# Patient Record
Sex: Male | Born: 1940 | Race: White | Hispanic: No | Marital: Married | Smoking: Former smoker
Health system: Southern US, Community
[De-identification: ages and names within clinical notes are randomized; demographics above are authoritative.]

## PROBLEM LIST (undated history)

## (undated) DIAGNOSIS — M1990 Unspecified osteoarthritis, unspecified site: Secondary | ICD-10-CM

## (undated) DIAGNOSIS — K219 Gastro-esophageal reflux disease without esophagitis: Secondary | ICD-10-CM

## (undated) DIAGNOSIS — E119 Type 2 diabetes mellitus without complications: Secondary | ICD-10-CM

## (undated) DIAGNOSIS — F419 Anxiety disorder, unspecified: Secondary | ICD-10-CM

## (undated) DIAGNOSIS — E7800 Pure hypercholesterolemia, unspecified: Secondary | ICD-10-CM

## (undated) DIAGNOSIS — I2699 Other pulmonary embolism without acute cor pulmonale: Secondary | ICD-10-CM

## (undated) DIAGNOSIS — I499 Cardiac arrhythmia, unspecified: Secondary | ICD-10-CM

## (undated) DIAGNOSIS — K7460 Unspecified cirrhosis of liver: Secondary | ICD-10-CM

## (undated) DIAGNOSIS — K449 Diaphragmatic hernia without obstruction or gangrene: Secondary | ICD-10-CM

## (undated) DIAGNOSIS — R55 Syncope and collapse: Secondary | ICD-10-CM

## (undated) DIAGNOSIS — Z87442 Personal history of urinary calculi: Secondary | ICD-10-CM

## (undated) DIAGNOSIS — I714 Abdominal aortic aneurysm, without rupture, unspecified: Secondary | ICD-10-CM

## (undated) DIAGNOSIS — K5790 Diverticulosis of intestine, part unspecified, without perforation or abscess without bleeding: Secondary | ICD-10-CM

## (undated) DIAGNOSIS — R9439 Abnormal result of other cardiovascular function study: Secondary | ICD-10-CM

## (undated) DIAGNOSIS — I4891 Unspecified atrial fibrillation: Secondary | ICD-10-CM

## (undated) DIAGNOSIS — I480 Paroxysmal atrial fibrillation: Secondary | ICD-10-CM

## (undated) DIAGNOSIS — I5189 Other ill-defined heart diseases: Secondary | ICD-10-CM

## (undated) HISTORY — DX: Other pulmonary embolism without acute cor pulmonale: I26.99

## (undated) HISTORY — DX: Abnormal result of other cardiovascular function study: R94.39

## (undated) HISTORY — DX: Diaphragmatic hernia without obstruction or gangrene: K44.9

## (undated) HISTORY — DX: Syncope and collapse: R55

## (undated) HISTORY — DX: Other ill-defined heart diseases: I51.89

## (undated) HISTORY — DX: Unspecified cirrhosis of liver: K74.60

## (undated) HISTORY — DX: Pure hypercholesterolemia, unspecified: E78.00

## (undated) HISTORY — DX: Gastro-esophageal reflux disease without esophagitis: K21.9

## (undated) HISTORY — DX: Type 2 diabetes mellitus without complications: E11.9

## (undated) HISTORY — DX: Diverticulosis of intestine, part unspecified, without perforation or abscess without bleeding: K57.90

## (undated) HISTORY — DX: Abdominal aortic aneurysm, without rupture, unspecified: I71.40

## (undated) HISTORY — DX: Anxiety disorder, unspecified: F41.9

## (undated) HISTORY — DX: Paroxysmal atrial fibrillation: I48.0

## (undated) HISTORY — DX: Unspecified atrial fibrillation: I48.91

---
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
## 2001-11-10 DIAGNOSIS — I2699 Other pulmonary embolism without acute cor pulmonale: Secondary | ICD-10-CM

## 2001-11-10 HISTORY — DX: Other pulmonary embolism without acute cor pulmonale: I26.99

## 2004-08-19 ENCOUNTER — Other Ambulatory Visit: Admission: RE | Admit: 2004-08-19 | Discharge: 2004-08-19 | Payer: Self-pay | Admitting: Dermatology

## 2004-11-16 ENCOUNTER — Emergency Department (HOSPITAL_COMMUNITY): Admission: EM | Admit: 2004-11-16 | Discharge: 2004-11-16 | Payer: Self-pay | Admitting: Emergency Medicine

## 2004-12-09 ENCOUNTER — Ambulatory Visit: Payer: Self-pay | Admitting: Family Medicine

## 2005-01-13 ENCOUNTER — Ambulatory Visit: Payer: Self-pay | Admitting: Internal Medicine

## 2005-01-13 ENCOUNTER — Ambulatory Visit (HOSPITAL_COMMUNITY): Admission: RE | Admit: 2005-01-13 | Discharge: 2005-01-13 | Payer: Self-pay | Admitting: Internal Medicine

## 2005-01-13 HISTORY — PX: COLONOSCOPY: SHX174

## 2005-01-13 HISTORY — PX: ESOPHAGOGASTRODUODENOSCOPY: SHX1529

## 2005-03-11 ENCOUNTER — Ambulatory Visit: Payer: Self-pay | Admitting: Family Medicine

## 2005-06-18 ENCOUNTER — Ambulatory Visit: Payer: Self-pay | Admitting: Family Medicine

## 2005-08-04 ENCOUNTER — Ambulatory Visit: Payer: Self-pay | Admitting: Family Medicine

## 2005-10-06 ENCOUNTER — Ambulatory Visit: Payer: Self-pay | Admitting: Family Medicine

## 2005-12-30 ENCOUNTER — Ambulatory Visit: Payer: Self-pay | Admitting: Family Medicine

## 2006-04-29 ENCOUNTER — Ambulatory Visit: Payer: Self-pay | Admitting: Family Medicine

## 2006-09-07 ENCOUNTER — Ambulatory Visit: Payer: Self-pay | Admitting: Family Medicine

## 2006-11-17 ENCOUNTER — Ambulatory Visit: Payer: Self-pay | Admitting: Family Medicine

## 2010-02-13 DIAGNOSIS — K5900 Constipation, unspecified: Secondary | ICD-10-CM | POA: Insufficient documentation

## 2010-02-13 DIAGNOSIS — Z86010 Personal history of colonic polyps: Secondary | ICD-10-CM

## 2010-02-13 DIAGNOSIS — F17200 Nicotine dependence, unspecified, uncomplicated: Secondary | ICD-10-CM

## 2010-02-13 DIAGNOSIS — K219 Gastro-esophageal reflux disease without esophagitis: Secondary | ICD-10-CM

## 2010-02-14 ENCOUNTER — Ambulatory Visit: Payer: Self-pay | Admitting: Internal Medicine

## 2010-02-15 DIAGNOSIS — R1319 Other dysphagia: Secondary | ICD-10-CM | POA: Insufficient documentation

## 2010-03-13 ENCOUNTER — Ambulatory Visit (HOSPITAL_COMMUNITY): Admission: RE | Admit: 2010-03-13 | Discharge: 2010-03-13 | Payer: Self-pay | Admitting: Internal Medicine

## 2010-03-13 ENCOUNTER — Ambulatory Visit: Payer: Self-pay | Admitting: Internal Medicine

## 2010-03-13 HISTORY — PX: ESOPHAGOGASTRODUODENOSCOPY: SHX1529

## 2010-03-13 HISTORY — PX: COLONOSCOPY: SHX174
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
## 2010-12-10 NOTE — Letter (Signed)
Summary: EGD/ED/TCS ORDER  EGD/ED/TCS ORDER   Imported By: Ave Filter 02/14/2010 14:50:17  _____________________________________________________________________  External Attachment:    Type:   Image     Comment:   External Document

## 2011-03-28 NOTE — Op Note (Signed)
NAME:  David Duarte, David Duarte NO.:  [id number]   MEDICAL RECORD NO.:  [id number]          PATIENT TYPE:  AMB   LOCATION:  DAY                           FACILITY:  [facility]   PHYSICIAN:  David Duarte, M.D. DATE OF BIRTH:  Mar 22, 1941   DATE OF PROCEDURE:  01/13/2005  DATE OF DISCHARGE:                                 OPERATIVE REPORT   PROCEDURE:  Esophagogastroduodenoscopy, diagnostic, followed by colonoscopy  with biopsy.   INDICATION FOR PROCEDURE:  The patient is a 70 year old gentleman with a  history of colonic polyps, and he had resections done in [place].  He  has been referred by Dr. Lysbeth Duarte for surveillance examination.  He is really  not having any lower GI tract symptoms.  Today he has also asked me to  perform an EGD.  He tells me he had a lesion in his throat which has been  followed for years and was told by [place] physicians it would never  cause him any trouble.  He has longstanding gastroesophageal reflux disease  symptoms.  He is a long-time tobacco user as well and wishes to have his  upper GI tract looked at.  His reflux symptoms are well-controlled on  Protonix.  He does not have any alarming symptoms such as bleeding,  odynophagia, dysphagia or early satiety, weight loss.  EGD and colonoscopy  are therefore now being done.  This approach has been discussed with the  patient at length, the potential risks, benefits, and alternatives have been  reviewed, questions answered.  Please see documentation in the medical  record.   PROCEDURE NOTE:  O2 saturation, blood pressure, pulse, and respiration were  monitored throughout the entirety of both procedures.   CONSCIOUS SEDATION:  Versed 4 mg IV, Demerol 100 mg IV in divided doses,  Cetacaine spray for topical oropharyngeal anesthesia.   INSTRUMENT USED:  Olympus video chip system.   FINDINGS:  EGD:  Examination of the tubular esophagus revealed a couple of  tiny distal esophageal  erosions.  I looked at the hypopharynx, and I could  find no abnormality.  There was no evidence of Barrett's esophagus or other  esophageal mucosal abnormality.  The EG junction was easily traversed.   Stomach:  The gastric cavity was empty and insufflated well with air.  A  thorough examination of the gastric mucosa, including a retroflexed view of  the proximal stomach and esophagogastric junction, demonstrated no  abnormalities aside from a small hiatal hernia.  Pylorus patent and easily  traversed.  Examination of the bulb and second portion revealed no  abnormalities.   Therapeutic/diagnostic maneuvers performed:  None.   The patient tolerated the procedure well and was prepared for colonoscopy.  Digital rectal examination revealed no abnormalities.  Endoscopic findings:  Prep was good.   Rectum:  Examination of the rectal mucosa, including retroflexed view of the  anal verge, revealed a 3 mm diminutive polyp 3 cm in from the anal verge.  Otherwise, rectal mucosa appeared normal.   Colon:  The colonic mucosa was surveyed from the rectosigmoid junction  through the left, transverse and  right colon to the area of the appendiceal  orifice, ileocecal valve and cecum. These structures were well-seen and  photographed for the record.  From this level the scope was slowly withdrawn  and all previously-mentioned mucosal surfaces were again seen.  The patient  was noted to have scattered pancolonic diverticula.  The remainder of the  colonic mucosa appeared normal.  The polyp in the rectum was cold  biopsied/removed/ablated with the cold biopsy forceps.  The patient  tolerated the procedure well, was reacted in endoscopy.   IMPRESSION:  1.  Diminutive rectal polyp, biopsied/ablated with the cold biopsy forceps,      otherwise normal rectum.  2.  Pancolonic diverticula.   Esophagogastroduodenoscopy findings:  1.  Normal-appearing hypopharynx.  2.  Tiny distal esophageal erosion  consistent with mild erosive reflux      esophagitis.  Remainder of the esophageal mucosa appeared normal.  3.  Normal stomach aside from a small hiatal hernia, normal D1, D2.   David Duarte gives impressive history of a problem in his throat for which  he has been followed over the years.  I wonder if he has seen an  otolaryngologist in the past.   Today's colonoscopy findings are reassuring.   RECOMMENDATIONS:  1.  Given his long tobacco history and his reported chronic problem in his      throat, he should be seen by an ENT physician.  I have urged him to seek      out consultation.  2.  Will follow up on pathology.  3.  Diverticulosis literature provided to David Duarte.  4.  Further recommendations to follow.      RMR/MEDQ  D:  01/13/2005  T:  01/13/2005  Job:  [id number]   cc:   David Duarte, M.D.  [address]  [place]  [place] [address]  Fax: [phone]

## 2012-10-04 ENCOUNTER — Other Ambulatory Visit (HOSPITAL_COMMUNITY): Payer: Self-pay | Admitting: Internal Medicine

## 2012-10-04 DIAGNOSIS — R55 Syncope and collapse: Secondary | ICD-10-CM

## 2012-10-04 DIAGNOSIS — R079 Chest pain, unspecified: Secondary | ICD-10-CM

## 2012-10-05 ENCOUNTER — Encounter (HOSPITAL_COMMUNITY)
Admission: RE | Admit: 2012-10-05 | Discharge: 2012-10-05 | Disposition: A | Discharge status: Home / Self Care | Payer: Medicare Other | Source: Ambulatory Visit | Attending: Internal Medicine | Admitting: Internal Medicine

## 2012-10-05 ENCOUNTER — Encounter (HOSPITAL_COMMUNITY): Payer: Self-pay

## 2012-10-05 ENCOUNTER — Ambulatory Visit (HOSPITAL_COMMUNITY)
Admission: RE | Admit: 2012-10-05 | Discharge: 2012-10-05 | Disposition: A | Discharge status: Home / Self Care | Payer: Medicare Other | Source: Ambulatory Visit | Attending: Internal Medicine | Admitting: Internal Medicine

## 2012-10-05 ENCOUNTER — Encounter (HOSPITAL_COMMUNITY): Payer: Self-pay | Admitting: Internal Medicine

## 2012-10-05 DIAGNOSIS — R55 Syncope and collapse: Secondary | ICD-10-CM | POA: Insufficient documentation

## 2012-10-05 DIAGNOSIS — R079 Chest pain, unspecified: Secondary | ICD-10-CM | POA: Insufficient documentation

## 2012-10-05 HISTORY — DX: Type 2 diabetes mellitus without complications: E11.9

## 2012-10-05 MED ORDER — TECHNETIUM TC 99M SESTAMIBI - CARDIOLITE
30.0000 | Freq: Once | INTRAVENOUS | Status: AC | PRN
  Administered 2012-10-05: 13:00:00 25 via INTRAVENOUS

## 2012-10-05 MED ORDER — TECHNETIUM TC 99M SESTAMIBI - CARDIOLITE
10.0000 | Freq: Once | INTRAVENOUS | Status: AC | PRN
  Administered 2012-10-05: 9 via INTRAVENOUS

## 2012-10-05 MED ORDER — SODIUM CHLORIDE 0.9 % IJ SOLN
INTRAMUSCULAR | Status: AC
  Administered 2012-10-05: 10 mL via INTRAVENOUS
  Filled 2012-10-05: qty 10

## 2012-10-05 NOTE — Progress Notes (Signed)
Stress Lab Nurses Notes - David Duarte 10/05/2012 Reason for doing test: Syncope Type of test: Stress Cardiolite Nurse performing test: Parke Poisson, RN Nuclear Medicine Tech: Lou Cal Echo Tech: Not Applicable MD performing test: Dr. Rennis Golden Family MD: Dr. Lysbeth Galas Test explained and consent signed: yes IV started: 22g jelco, Saline lock flushed, No redness or edema and Saline lock started in radiology Symptoms: SOB &  Fatigue Treatment/Intervention: None Reason test stopped: fatigue and reached target HR After recovery IV was: Discontinued via X-ray tech and No redness or edema Patient to return to Nuc. Med at : 14:15 Patient discharged: Home Patient's Condition upon discharge was: stable Comments: During test peak BP 220/81 & HR 150.  Recovery BP 160/81 & HR 86.  Symptoms resolved in recovery .  lBillingsley, Jamesetta So T

## 2012-10-05 NOTE — CV Procedure (Signed)
Patient presented for an exercise nuclear stress test.  He exercised for 4 minutes and 30 seconds on the Bruce treadmill protocol into Stage 2. There was a marked hypertensive response to exercise, peak blood pressure is 220 systolic. Baseline EKG demonstrated sinus rhythm without ischemic changes. Exercise EKG shows sinus tachycardia without ST segment changes. Negative for ischemia.  Perfusion images to follow.  Chrystie Nose, MD, [facility] Attending Cardiologist [facility]

## 2012-10-12 ENCOUNTER — Encounter: Payer: Self-pay | Admitting: Internal Medicine

## 2012-10-13 ENCOUNTER — Encounter: Payer: Self-pay | Admitting: Urgent Care

## 2012-10-13 ENCOUNTER — Ambulatory Visit (INDEPENDENT_AMBULATORY_CARE_PROVIDER_SITE_OTHER): Payer: Medicare Other | Admitting: Urgent Care

## 2012-10-13 ENCOUNTER — Ambulatory Visit: Payer: Medicare Other | Admitting: Gastroenterology

## 2012-10-13 VITALS — BP 138/79 | HR 80 | Temp 97.5°F | Ht 72.0 in | Wt 259.6 lb

## 2012-10-13 DIAGNOSIS — R1319 Other dysphagia: Secondary | ICD-10-CM

## 2012-10-13 DIAGNOSIS — K219 Gastro-esophageal reflux disease without esophagitis: Secondary | ICD-10-CM

## 2012-10-13 NOTE — Assessment & Plan Note (Addendum)
David Duarte is a pleasant 71 y.o. male with episodes of dysphagia with both solids & liquids.  He also has discrete episodes of what sounds like possible aspiration +/- vasovagal syncope.  He has cardiologist on board & is undergoing work-up w/ Dr Rennis Golden.  He will need EGD with possible esophageal dilation by Dr Jena Gauss to look for esophageal culprit such as web, ring or stricture.  I have discussed risks & benefits which include, but are not limited to, bleeding, infection, perforation & drug reaction.  The patient agrees with this plan & written consent will be obtained.  Phenergan 25mg  IV 30 min prior to procedure to augment sedation given pt has some recall of last procedure under conscious sedation.  If EGD benign, consider evaluation for oropharyngeal component. Hold diabetes medications day of procedure-GLYBURIDE & GLUCOPHAGE

## 2012-10-13 NOTE — Patient Instructions (Addendum)
Hold diabetes medications day of procedure-GLYBURIDE & GLUCOPHAGE Continue omeprazole 20mg  before breakfast & dinner EGD (upper endoscopy) with Dr Jena Gauss.  He may stretch your esophagus at the same time.
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
## 2012-10-14 ENCOUNTER — Encounter (HOSPITAL_COMMUNITY): Payer: Self-pay | Admitting: Pharmacy Technician

## 2012-10-14 ENCOUNTER — Encounter: Payer: Self-pay | Admitting: Urgent Care

## 2012-10-14 NOTE — Progress Notes (Signed)
Faxed to PCP

## 2012-10-14 NOTE — Assessment & Plan Note (Signed)
Symptomatic control of heartburn & indigestion with omeprazole 20mg  before breakfast & dinner.  See dysphagia.

## 2012-10-25 ENCOUNTER — Encounter (HOSPITAL_COMMUNITY): Payer: Self-pay | Admitting: *Deleted

## 2012-10-25 ENCOUNTER — Encounter (HOSPITAL_COMMUNITY)
Admission: RE | Disposition: A | Discharge status: Home / Self Care | Payer: Self-pay | Source: Ambulatory Visit | Attending: Internal Medicine

## 2012-10-25 ENCOUNTER — Other Ambulatory Visit: Payer: Self-pay | Admitting: Internal Medicine

## 2012-10-25 ENCOUNTER — Ambulatory Visit (HOSPITAL_COMMUNITY)
Admission: RE | Admit: 2012-10-25 | Discharge: 2012-10-25 | Disposition: A | Discharge status: Home / Self Care | Payer: Medicare Other | Source: Ambulatory Visit | Attending: Internal Medicine | Admitting: Internal Medicine

## 2012-10-25 ENCOUNTER — Telehealth: Payer: Self-pay | Admitting: Internal Medicine

## 2012-10-25 DIAGNOSIS — R1310 Dysphagia, unspecified: Secondary | ICD-10-CM

## 2012-10-25 DIAGNOSIS — K449 Diaphragmatic hernia without obstruction or gangrene: Secondary | ICD-10-CM | POA: Insufficient documentation

## 2012-10-25 DIAGNOSIS — E119 Type 2 diabetes mellitus without complications: Secondary | ICD-10-CM | POA: Insufficient documentation

## 2012-10-25 DIAGNOSIS — D131 Benign neoplasm of stomach: Secondary | ICD-10-CM | POA: Insufficient documentation

## 2012-10-25 DIAGNOSIS — K222 Esophageal obstruction: Secondary | ICD-10-CM | POA: Insufficient documentation

## 2012-10-25 DIAGNOSIS — K219 Gastro-esophageal reflux disease without esophagitis: Secondary | ICD-10-CM | POA: Insufficient documentation

## 2012-10-25 HISTORY — PX: ESOPHAGOGASTRODUODENOSCOPY (EGD) WITH ESOPHAGEAL DILATION: SHX5812

## 2012-10-25 LAB — GLUCOSE, CAPILLARY: Glucose-Capillary: 182 mg/dL — ABNORMAL HIGH (ref 70–99)

## 2012-10-25 SURGERY — ESOPHAGOGASTRODUODENOSCOPY (EGD) WITH ESOPHAGEAL DILATION
Anesthesia: Moderate Sedation

## 2012-10-25 MED ORDER — MIDAZOLAM HCL 5 MG/5ML IJ SOLN
INTRAMUSCULAR | Status: AC
  Filled 2012-10-25: qty 10

## 2012-10-25 MED ORDER — STERILE WATER FOR IRRIGATION IR SOLN
Status: DC | PRN
  Administered 2012-10-25: 08:00:00

## 2012-10-25 MED ORDER — PROMETHAZINE HCL 25 MG/ML IJ SOLN
INTRAMUSCULAR | Status: AC
  Filled 2012-10-25: qty 1

## 2012-10-25 MED ORDER — SODIUM CHLORIDE 0.9 % IJ SOLN
INTRAMUSCULAR | Status: AC
  Filled 2012-10-25: qty 10

## 2012-10-25 MED ORDER — MEPERIDINE HCL 100 MG/ML IJ SOLN
INTRAMUSCULAR | Status: DC | PRN
  Administered 2012-10-25 (×2): 50 mg via INTRAVENOUS

## 2012-10-25 MED ORDER — MIDAZOLAM HCL 5 MG/5ML IJ SOLN
INTRAMUSCULAR | Status: DC | PRN
  Administered 2012-10-25 (×2): 2 mg via INTRAVENOUS

## 2012-10-25 MED ORDER — SODIUM CHLORIDE 0.45 % IV SOLN
INTRAVENOUS | Status: DC
  Administered 2012-10-25: 1000 mL via INTRAVENOUS

## 2012-10-25 MED ORDER — MEPERIDINE HCL 100 MG/ML IJ SOLN
INTRAMUSCULAR | Status: AC
  Filled 2012-10-25: qty 2

## 2012-10-25 MED ORDER — PROMETHAZINE HCL 25 MG/ML IJ SOLN
12.5000 mg | Freq: Once | INTRAMUSCULAR | Status: AC
  Administered 2012-10-25: 12.5 mg via INTRAVENOUS

## 2012-10-25 MED ORDER — BUTAMBEN-TETRACAINE-BENZOCAINE 2-2-14 % EX AERO
INHALATION_SPRAY | CUTANEOUS | Status: DC | PRN
  Administered 2012-10-25: 2 via TOPICAL

## 2012-10-25 NOTE — Op Note (Signed)
[facility] [address] [place], [address]   ENDOSCOPY PROCEDURE REPORT  PATIENT: David Duarte, David Duarte  MR#: [id number] BIRTHDATE: Feb 24, 1941 , 71  yrs. old GENDER: Male ENDOSCOPIST: R.  Roetta Sessions, MD [facility] REFERRED BY:  Joette Catching, M.D. , Dr. Rennis Golden PROCEDURE DATE:  10/25/2012 PROCEDURE:     EGD with Elease Hashimoto dilation followed by gastric biopsy  INDICATIONS:    Recurrent esophageal dysphagia. Patient gives me a history of more solid food and pill dysphagia greater than liquid dysphagia.  INFORMED CONSENT:   The risks, benefits, limitations, alternatives and imponderables have been discussed.  The potential for biopsy, esophogeal dilation, etc. have also been reviewed.  Questions have been answered.  All parties agreeable.  Please see the history and physical in the medical record for more information.  MEDICATIONS:    Versed 4 mg IV and Demerol 100 mg IV in divided doses. Cetacaine spray. Phenergan 12.5 mg IV  DESCRIPTION OF PROCEDURE:   The AO-1308M ([id number])  endoscope was introduced through the mouth and advanced to the second portion of the duodenum without difficulty or limitations.  The mucosal surfaces were surveyed very carefully during advancement of the scope and upon withdrawal.  Retroflexion view of the proximal stomach and esophagogastric junction was performed.      FINDINGS: Noncritical. Schatzki's ring; otherwise normal-appearing esophagus. Stomach empty. Moderate-sized final hernia. Multiple tiny 2-3 mm fundal gland-appearing polyps; otherwise, the remainder of the gastric mucosa appeared normal. Pylorus patent. Normal first and second portion of the duodenum  THERAPEUTIC / DIAGNOSTIC MANEUVERS PERFORMED:  A 56 French Maloney dilator is passed to full insertion easily. Subsequently, a 24 French Maloney dilator was passed to full insertion easily. A look back revealed the ring had been ruptured without apparent complication.  Finally, one of the gastric polyps was biopsied for histologic study   COMPLICATIONS:  None  IMPRESSION:  Schatzki's ring-status post dilation as described above. Hiatal hernia. Gastric polyps-status post biopsy  RECOMMENDATIONS:   Continue omeprazole20 mg orally twicedaily. Office visit with [place] in 6 weeks. Followup on pathology.  Please note, loud snoring noted during the procedure. Interviewed wife. Spouse says he "rattles the walls" at night. Tosses and turns all night. She is unable to sleep in the same room because of the noise.   I  suspect the patient likely has undiagnosed sleep apnea. I would recommend further evaluation with a sleep study per doctor's Nyland /  Hilty.    _______________________________ R. Roetta Sessions, MD FACP [facility] eSigned:  R. Roetta Sessions, MD FACP [facility] 10/25/2012 8:22 AM     CC:  PATIENT NAME:  [name], [name] MR#: [id number]
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
## 2012-10-25 NOTE — Interval H&P Note (Signed)
History and Physical Interval Note:  10/25/2012 7:43 AM  David Duarte  has presented today for surgery, with the diagnosis of DYSPHAGIA AND GERD  The various methods of treatment have been discussed with the patient and family. After consideration of risks, benefits and other options for treatment, the patient has consented to  Procedure(s) (LRB) with comments: ESOPHAGOGASTRODUODENOSCOPY (EGD) WITH ESOPHAGEAL DILATION (N/A) - 7:30/GIVE PHENERGAN 25 IV 30 MINS PRIOR TO PROCEDURE as a surgical intervention .  The patient's history has been reviewed, patient examined, no change in status, stable for surgery.  I have reviewed the patient's chart and labs.  Questions were answered to the patient's satisfaction.     Eula Listen  As outlined in consultation note. Says pills and food stick, pointing to his suprasternal notch. He states dilation 2011 helped. EGD with dilation as appropriate per plan.The risks, benefits, limitations, alternatives and imponderables have been reviewed with the patient. Potential for esophageal dilation, biopsy, etc. have also been reviewed.  Questions have been answered. All parties agreeable.

## 2012-10-25 NOTE — Telephone Encounter (Signed)
Phenergan wasn't put in by me because the day he was scheduled I didn't have privileges to put it in but I talked to Shawna Orleans and Selena Batten and I was told that they could do it until I received privileges again

## 2012-10-25 NOTE — Telephone Encounter (Signed)
Message copied by Glendora Score on Mon Oct 25, 2012 12:34 PM ------      Message from: Jennings Books      Created: Mon Oct 25, 2012 12:29 PM       Please investigate and follow-up with RMR.      ----- Message -----         From: Corbin Ade, MD         Sent: 10/25/2012   7:37 AM           To: Osvaldo Angst, NP            Phenergan recommended at the time of consultation but not ordered. Procedure delayed.

## 2012-10-27 ENCOUNTER — Encounter: Payer: Self-pay | Admitting: Internal Medicine

## 2012-10-27 ENCOUNTER — Encounter: Payer: Self-pay | Admitting: *Deleted

## 2012-10-28 ENCOUNTER — Encounter (HOSPITAL_COMMUNITY): Payer: Self-pay | Admitting: Internal Medicine

## 2012-11-14 ENCOUNTER — Encounter: Payer: Self-pay | Admitting: Internal Medicine

## 2012-12-02 ENCOUNTER — Encounter: Payer: Self-pay | Admitting: Internal Medicine

## 2012-12-06 ENCOUNTER — Ambulatory Visit: Payer: Medicare Other | Admitting: Urgent Care

## 2012-12-06 ENCOUNTER — Ambulatory Visit: Payer: Medicare Other | Admitting: Gastroenterology

## 2013-03-16 ENCOUNTER — Encounter: Payer: Self-pay | Admitting: Internal Medicine

## 2014-02-01 ENCOUNTER — Telehealth: Payer: Self-pay | Admitting: *Deleted

## 2014-02-01 NOTE — Telephone Encounter (Signed)
Pt's wife called saying Dr. 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 Per wife Dr. Edrick Oh said that pt needs to have another colonoscopy, pt is not due for a colonoscopy until 03/2015, pt's wife is concerned about insurance not paying for him to go ahead and have one done because it is not due. Please advise [phone]

## 2014-02-07 NOTE — Telephone Encounter (Signed)
Tried to call to get more details. We have not received a referral from Dr. Edrick Oh yet.

## 2014-02-08 NOTE — Telephone Encounter (Signed)
I called PCP office and spoke with Nunzio Cory. I told her that the patient's wife had called last week and we hadn't received a referral and needed more details of what was going on with patient. She is checking with Dr Edrick Oh to see what he advises and she will get back with [place] or either fax referral.

## 2015-02-25 ENCOUNTER — Emergency Department (HOSPITAL_COMMUNITY): Payer: Medicare Other

## 2015-02-25 ENCOUNTER — Emergency Department (HOSPITAL_COMMUNITY)
Admission: EM | Admit: 2015-02-25 | Discharge: 2015-02-25 | Disposition: A | Discharge status: Home / Self Care | Payer: Medicare Other | Attending: Emergency Medicine | Admitting: Emergency Medicine

## 2015-02-25 ENCOUNTER — Encounter (HOSPITAL_COMMUNITY): Payer: Self-pay | Admitting: Emergency Medicine

## 2015-02-25 DIAGNOSIS — S0181XA Laceration without foreign body of other part of head, initial encounter: Secondary | ICD-10-CM

## 2015-02-25 DIAGNOSIS — Z86711 Personal history of pulmonary embolism: Secondary | ICD-10-CM | POA: Diagnosis not present

## 2015-02-25 DIAGNOSIS — Z7982 Long term (current) use of aspirin: Secondary | ICD-10-CM | POA: Insufficient documentation

## 2015-02-25 DIAGNOSIS — S01412A Laceration without foreign body of left cheek and temporomandibular area, initial encounter: Secondary | ICD-10-CM | POA: Diagnosis not present

## 2015-02-25 DIAGNOSIS — W1839XA Other fall on same level, initial encounter: Secondary | ICD-10-CM | POA: Diagnosis not present

## 2015-02-25 DIAGNOSIS — E780 Pure hypercholesterolemia: Secondary | ICD-10-CM | POA: Diagnosis not present

## 2015-02-25 DIAGNOSIS — Y939 Activity, unspecified: Secondary | ICD-10-CM | POA: Diagnosis not present

## 2015-02-25 DIAGNOSIS — K219 Gastro-esophageal reflux disease without esophagitis: Secondary | ICD-10-CM | POA: Insufficient documentation

## 2015-02-25 DIAGNOSIS — Y929 Unspecified place or not applicable: Secondary | ICD-10-CM | POA: Insufficient documentation

## 2015-02-25 DIAGNOSIS — Z87891 Personal history of nicotine dependence: Secondary | ICD-10-CM | POA: Diagnosis not present

## 2015-02-25 DIAGNOSIS — E119 Type 2 diabetes mellitus without complications: Secondary | ICD-10-CM | POA: Diagnosis not present

## 2015-02-25 DIAGNOSIS — F10920 Alcohol use, unspecified with intoxication, uncomplicated: Secondary | ICD-10-CM

## 2015-02-25 DIAGNOSIS — Z79899 Other long term (current) drug therapy: Secondary | ICD-10-CM | POA: Diagnosis not present

## 2015-02-25 DIAGNOSIS — Y999 Unspecified external cause status: Secondary | ICD-10-CM | POA: Diagnosis not present

## 2015-02-25 DIAGNOSIS — R51 Headache: Secondary | ICD-10-CM | POA: Diagnosis not present

## 2015-02-25 DIAGNOSIS — R112 Nausea with vomiting, unspecified: Secondary | ICD-10-CM | POA: Insufficient documentation

## 2015-02-25 DIAGNOSIS — T1490XA Injury, unspecified, initial encounter: Secondary | ICD-10-CM

## 2015-02-25 DIAGNOSIS — Z23 Encounter for immunization: Secondary | ICD-10-CM | POA: Insufficient documentation

## 2015-02-25 DIAGNOSIS — F10120 Alcohol abuse with intoxication, uncomplicated: Secondary | ICD-10-CM | POA: Diagnosis not present

## 2015-02-25 DIAGNOSIS — F419 Anxiety disorder, unspecified: Secondary | ICD-10-CM | POA: Insufficient documentation

## 2015-02-25 LAB — CBC WITH DIFFERENTIAL/PLATELET
BASOS PCT: 0 % (ref 0–1)
Basophils Absolute: 0 10*3/uL (ref 0.0–0.1)
EOS PCT: 2 % (ref 0–5)
Eosinophils Absolute: 0.2 10*3/uL (ref 0.0–0.7)
HEMATOCRIT: 36.2 % — AB (ref 39.0–52.0)
Hemoglobin: 12.4 g/dL — ABNORMAL LOW (ref 13.0–17.0)
LYMPHS ABS: 1.1 10*3/uL (ref 0.7–4.0)
LYMPHS PCT: 13 % (ref 12–46)
MCH: 32.1 pg (ref 26.0–34.0)
MCHC: 34.3 g/dL (ref 30.0–36.0)
MCV: 93.8 fL (ref 78.0–100.0)
MONOS PCT: 10 % (ref 3–12)
Monocytes Absolute: 0.8 10*3/uL (ref 0.1–1.0)
NEUTROS ABS: 6.2 10*3/uL (ref 1.7–7.7)
NEUTROS PCT: 75 % (ref 43–77)
PLATELETS: 149 10*3/uL — AB (ref 150–400)
RBC: 3.86 MIL/uL — AB (ref 4.22–5.81)
RDW: 14.2 % (ref 11.5–15.5)
WBC: 8.2 10*3/uL (ref 4.0–10.5)

## 2015-02-25 LAB — BASIC METABOLIC PANEL
ANION GAP: 13 (ref 5–15)
BUN: 13 mg/dL (ref 6–23)
CHLORIDE: 99 mmol/L (ref 96–112)
CO2: 21 mmol/L (ref 19–32)
CREATININE: 0.78 mg/dL (ref 0.50–1.35)
Calcium: 8.7 mg/dL (ref 8.4–10.5)
GFR calc non Af Amer: 88 mL/min — ABNORMAL LOW (ref >90)
Glucose, Bld: 175 mg/dL — ABNORMAL HIGH (ref 70–99)
Potassium: 3.5 mmol/L (ref 3.5–5.1)
SODIUM: 133 mmol/L — AB (ref 135–145)

## 2015-02-25 LAB — ETHANOL: ALCOHOL ETHYL (B): 155 mg/dL — AB (ref 0–9)

## 2015-02-25 LAB — CBG MONITORING, ED: Glucose-Capillary: 174 mg/dL — ABNORMAL HIGH (ref 70–99)

## 2015-02-25 MED ORDER — ONDANSETRON HCL 4 MG/2ML IJ SOLN
4.0000 mg | Freq: Once | INTRAMUSCULAR | Status: AC
  Administered 2015-02-25: 4 mg via INTRAVENOUS
  Filled 2015-02-25: qty 2

## 2015-02-25 MED ORDER — LIDOCAINE HCL (PF) 1 % IJ SOLN
10.0000 mL | Freq: Once | INTRAMUSCULAR | Status: AC
  Administered 2015-02-25: 1 mL via INTRADERMAL
  Filled 2015-02-25: qty 10

## 2015-02-25 MED ORDER — TETANUS-DIPHTH-ACELL PERTUSSIS 5-2.5-18.5 LF-MCG/0.5 IM SUSP
0.5000 mL | Freq: Once | INTRAMUSCULAR | Status: AC
  Administered 2015-02-25: 0.5 mL via INTRAMUSCULAR
  Filled 2015-02-25: qty 0.5

## 2015-02-25 MED ORDER — FENTANYL CITRATE (PF) 100 MCG/2ML IJ SOLN
50.0000 ug | Freq: Once | INTRAMUSCULAR | Status: AC
  Administered 2015-02-25: 50 ug via INTRAVENOUS
  Filled 2015-02-25: qty 2

## 2015-02-25 MED ORDER — SODIUM CHLORIDE 0.9 % IV BOLUS (SEPSIS)
1000.0000 mL | Freq: Once | INTRAVENOUS | Status: AC
  Administered 2015-02-25: 1000 mL via INTRAVENOUS

## 2015-02-25 NOTE — ED Notes (Signed)
EMS reports head lac above left eye and left eye swelling of unknown origin; + ETOH ( pt reports more than usual); EMS also reports being combative on scene but calm and cooperative at this time; EMS also reports patient is conscious and alert to self, time, and place but unaware of situation leading to ER visit; EMS also reports one episode of projectile vomiting enroute.
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
## 2015-02-25 NOTE — ED Notes (Signed)
Joya Gaskins, MD at bedside performing laceration repair.

## 2015-02-25 NOTE — ED Notes (Signed)
Cash collected from patient, counted at bedside with patient and security, various bills totaling $2,344.00; cash locked in safe and key remains with nurse

## 2015-03-08 ENCOUNTER — Encounter: Payer: Self-pay | Admitting: Internal Medicine

## 2015-05-30 ENCOUNTER — Encounter: Payer: Self-pay | Admitting: *Deleted

## 2016-08-21 ENCOUNTER — Ambulatory Visit (INDEPENDENT_AMBULATORY_CARE_PROVIDER_SITE_OTHER): Payer: PRIVATE HEALTH INSURANCE | Admitting: Orthopaedic Surgery

## 2016-08-21 DIAGNOSIS — M25561 Pain in right knee: Secondary | ICD-10-CM

## 2016-08-21 DIAGNOSIS — M1711 Unilateral primary osteoarthritis, right knee: Secondary | ICD-10-CM

## 2016-09-25 DIAGNOSIS — F3342 Major depressive disorder, recurrent, in full remission: Secondary | ICD-10-CM | POA: Insufficient documentation

## 2017-01-01 ENCOUNTER — Ambulatory Visit (INDEPENDENT_AMBULATORY_CARE_PROVIDER_SITE_OTHER): Payer: PRIVATE HEALTH INSURANCE | Admitting: Orthopaedic Surgery

## 2017-01-01 ENCOUNTER — Encounter (INDEPENDENT_AMBULATORY_CARE_PROVIDER_SITE_OTHER): Payer: Self-pay | Admitting: Orthopaedic Surgery

## 2017-01-01 VITALS — BP 137/74 | HR 94 | Ht 72.0 in | Wt 265.0 lb

## 2017-01-01 DIAGNOSIS — M25571 Pain in right ankle and joints of right foot: Secondary | ICD-10-CM | POA: Diagnosis not present
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
## 2017-01-07 ENCOUNTER — Ambulatory Visit (INDEPENDENT_AMBULATORY_CARE_PROVIDER_SITE_OTHER): Payer: PRIVATE HEALTH INSURANCE | Admitting: Nurse Practitioner

## 2017-01-07 ENCOUNTER — Encounter: Payer: Self-pay | Admitting: Nurse Practitioner

## 2017-01-07 ENCOUNTER — Other Ambulatory Visit: Payer: Self-pay

## 2017-01-07 VITALS — BP 151/83 | HR 75 | Temp 97.7°F | Ht 72.0 in | Wt 268.8 lb

## 2017-01-07 DIAGNOSIS — K5900 Constipation, unspecified: Secondary | ICD-10-CM | POA: Diagnosis not present

## 2017-01-07 DIAGNOSIS — R1319 Other dysphagia: Secondary | ICD-10-CM | POA: Diagnosis not present

## 2017-01-07 DIAGNOSIS — Z86010 Personal history of colonic polyps: Secondary | ICD-10-CM | POA: Diagnosis not present

## 2017-01-07 DIAGNOSIS — R1310 Dysphagia, unspecified: Secondary | ICD-10-CM

## 2017-01-07 DIAGNOSIS — R109 Unspecified abdominal pain: Secondary | ICD-10-CM

## 2017-01-07 MED ORDER — PEG 3350-KCL-NA BICARB-NACL 420 G PO SOLR: 4000.0000 mL | ORAL | 0 refills | Status: DC

## 2017-01-07 NOTE — Assessment & Plan Note (Addendum)
Patient with noted recurrence of dysphagia. Has had multiple dilations in the past. No odynophagia. Typically 2-3 times a week with associated regurgitation. At this point we'll proceed with upper endoscopy with possible dilation due to dysphagia. Return for follow-up in 2 months.  Proceed with EGD +/- dilation on propofol/MAC with Dr. Gala Romney in near future: the risks, benefits, and alternatives have been discussed with the patient in detail. The patient states understanding and desires to proceed.  The patient is currently on Lexapro. Drinks alcohol regularly including a shot of hard liquor in 2-3 beers up to 2-3 times a week. He also states that his sedation was not optimal during his last procedure and is requesting stronger medicine. No other anticoagulants, anxiolytics, chronic pain medications, or antidepressants. Given all this I will plan for the procedure on propofol/MAC.
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
## 2017-01-07 NOTE — Patient Instructions (Signed)
1. I am giving you samples of Linzess 145 g. Take this once a day, on an empty stomach. 2. Let us know in 1-2 weeks if the medicine is working and how it is working. 3. We will schedule your procedures for you. 4. Return for follow-up in 2 months.

## 2017-01-07 NOTE — Assessment & Plan Note (Addendum)
History of multiple colon polyps, last colonoscopy 2011 with tubular adenoma. Noted suboptimal prep. No colonoscopy since then. Given the suboptimal prep and history of colon polyps we will plan for repeat colonoscopy at this time in addition to his upper endoscopy with possible dilation as per above. He should be on Linzess leading up to his procedure which will help improve his colon prep. Return for follow-up in 2 months.  Proceed with TCS on propofol/MAC with Dr. Gala Romney in near future: the risks, benefits, and alternatives have been discussed with the patient in detail. The patient states understanding and desires to proceed.  The patient is currently on Lexapro. Drinks alcohol regularly including a shot of hard liquor in 2-3 beers up to 2-3 times a week. He also states that his sedation was not optimal during his last procedure and is requesting stronger medicine. No other anticoagulants, anxiolytics, chronic pain medications, or antidepressants. Given all this I will plan for the procedure on propofol/MAC.

## 2017-01-07 NOTE — Progress Notes (Signed)
cc'ed to pcp °

## 2017-01-07 NOTE — Assessment & Plan Note (Signed)
Noted significant constipation, takes daily stool softener over-the-counter which is not effective. At this point I'll trial him on Linzess 145 g for [facility] 1 stools and constipation. We will provide samples for 1-2 weeks, request progress report in 1-2 weeks. Medication dose titration her age and change over the phone based on results. Return for follow-up in 2 months.

## 2017-01-21 NOTE — Patient Instructions (Signed)
David Duarte  01/21/2017     @PREFPERIOPPHARMACY @   Your procedure is scheduled on  02/02/2017   Report to [facility] at  645  A.M.  Call this number if you have problems the morning of surgery:  [phone]   Remember:  Do not eat food or drink liquids after midnight.  Take these medicines the morning of surgery with A SIP OF WATER  Lexapro, nexium, mobic, prilosec.   Do not wear jewelry, make-up or nail polish.  Do not wear lotions, powders, or perfumes, or deoderant.  Do not shave 48 hours prior to surgery.  Men may shave face and neck.  Do not bring valuables to the hospital.  [facility] is not responsible for any belongings or valuables.  Contacts, dentures or bridgework may not be worn into surgery.  Leave your suitcase in the car.  After surgery it may be brought to your room.  For patients admitted to the hospital, discharge time will be determined by your treatment team.  Patients discharged the day of surgery will not be allowed to drive home.   Name and phone number of your driver:   family Special instructions:  Follow the diet and prep instructions given to you by Dr Roseanne Kaufman office.  Please read over the following fact sheets that you were given. Anesthesia Post-op Instructions and Care and Recovery After Surgery       Esophagogastroduodenoscopy Esophagogastroduodenoscopy (EGD) is a procedure to examine the lining of the esophagus, stomach, and first part of the small intestine (duodenum). This procedure is done to check for problems such as inflammation, bleeding, ulcers, or growths. During this procedure, a long, flexible, lighted tube with a camera attached (endoscope) is inserted down the throat. Tell a health care provider about:  Any allergies you have.  All medicines you are taking, including vitamins, herbs, eye drops, creams, and over-the-counter medicines.  Any problems you or family members have had with  anesthetic medicines.  Any blood disorders you have.  Any surgeries you have had.  Any medical conditions you have.  Whether you are pregnant or may be pregnant. What are the risks? Generally, this is a safe procedure. However, problems may occur, including:  Infection.  Bleeding.  A tear (perforation) in the esophagus, stomach, or duodenum.  Trouble breathing.  Excessive sweating.  Spasms of the larynx.  A slowed heartbeat.  Low blood pressure. What happens before the procedure?  Follow instructions from your health care provider about eating or drinking restrictions.  Ask your health care provider about:  Changing or stopping your regular medicines. This is especially important if you are taking diabetes medicines or blood thinners.  Taking medicines such as aspirin and ibuprofen. These medicines can thin your blood. Do not take these medicines before your procedure if your health care provider instructs you not to.  Plan to have someone take you home after the procedure.  If you wear dentures, be ready to remove them before the procedure. What happens during the procedure?  To reduce your risk of infection, your health care team will wash or sanitize their hands.  An IV tube will be put in a vein in your hand or arm. You will get medicines and fluids through this tube.  You will be given one or more of the following:  A medicine to help you relax (sedative).  A medicine to numb the area (  local anesthetic). This medicine may be sprayed into your throat. It will make you feel more comfortable and keep you from gagging or coughing during the procedure.  A medicine for pain.  A mouth guard may be placed in your mouth to protect your teeth and to keep you from biting on the endoscope.  You will be asked to lie on your left side.  The endoscope will be lowered down your throat into your esophagus, stomach, and duodenum.  Air will be put into the endoscope.  This will help your health care provider see better.  The lining of your esophagus, stomach, and duodenum will be examined.  Your health care provider may:  Take a tissue sample so it can be looked at in a lab (biopsy).  Remove growths.  Remove objects (foreign bodies) that are stuck.  Treat any bleeding with medicines or other devices that stop tissue from bleeding.  Widen (dilate) or stretch narrowed areas of your esophagus and stomach.  The endoscope will be taken out. The procedure may vary among health care providers and hospitals. What happens after the procedure?  Your blood pressure, heart rate, breathing rate, and blood oxygen level will be monitored often until the medicines you were given have worn off.  Do not eat or drink anything until the numbing medicine has worn off and your gag reflex has returned. This information is not intended to replace advice given to you by your health care provider. Make sure you discuss any questions you have with your health care provider. Document Released: 02/27/2005 Document Revised: 04/03/2016 Document Reviewed: 09/20/2015 Elsevier Interactive Patient Education  2017 [place]. Esophagogastroduodenoscopy, Care After Refer to this sheet in the next few weeks. These instructions provide you with information about caring for yourself after your procedure. Your health care provider may also give you more specific instructions. Your treatment has been planned according to current medical practices, but problems sometimes occur. Call your health care provider if you have any problems or questions after your procedure. What can I expect after the procedure? After the procedure, it is common to have:  A sore throat.  Nausea.  Bloating.  Dizziness.  Fatigue. Follow these instructions at home:  Do not eat or drink anything until the numbing medicine (local anesthetic) has worn off and your gag reflex has returned. You will know that  the local anesthetic has worn off when you can swallow comfortably.  Do not drive for 24 hours if you received a medicine to help you relax (sedative).  If your health care provider took a tissue sample for testing during the procedure, make sure to get your test results. This is your responsibility. Ask your health care provider or the department performing the test when your results will be ready.  Keep all follow-up visits as told by your health care provider. This is important. Contact a health care provider if:  You cannot stop coughing.  You are not urinating.  You are urinating less than usual. Get help right away if:  You have trouble swallowing.  You cannot eat or drink.  You have throat or chest pain that gets worse.  You are dizzy or light-headed.  You faint.  You have nausea or vomiting.  You have chills.  You have a fever.  You have severe abdominal pain.  You have black, tarry, or bloody stools. This information is not intended to replace advice given to you by your health care provider. Make sure you discuss any  questions you have with your health care provider. Document Released: 10/13/2012 Document Revised: 04/03/2016 Document Reviewed: 09/20/2015 Elsevier Interactive Patient Education  2017 [employer]  Esophageal Dilatation Esophageal dilatation is a procedure to open a blocked or narrowed part of the esophagus. The esophagus is the long tube in your throat that carries food and liquid from your mouth to your stomach. The procedure is also called esophageal dilation. You may need this procedure if you have a buildup of scar tissue in your esophagus that makes it difficult, painful, or even impossible to swallow. This can be caused by gastroesophageal reflux disease (GERD). In rare cases, people need this procedure because they have cancer of the esophagus or a problem with the way food moves through the esophagus. Sometimes you may need to have another  dilatation to enlarge the opening of the esophagus gradually. Tell a health care provider about:  Any allergies you have.  All medicines you are taking, including vitamins, herbs, eye drops, creams, and over-the-counter medicines.  Any problems you or family members have had with anesthetic medicines.  Any blood disorders you have.  Any surgeries you have had.  Any medical conditions you have.  Any antibiotic medicines you are required to take before dental procedures. What are the risks? Generally, this is a safe procedure. However, problems can occur and include:  Bleeding from a tear in the lining of the esophagus.  A hole (perforation) in the esophagus. What happens before the procedure?  Do not eat or drink anything after midnight on the night before the procedure or as directed by your health care provider.  Ask your health care provider about changing or stopping your regular medicines. This is especially important if you are taking diabetes medicines or blood thinners.  Plan to have someone take you home after the procedure. What happens during the procedure?  You will be given a medicine that makes you relaxed and sleepy (sedative).  A medicine may be sprayed or gargled to numb the back of the throat.  Your health care provider can use various instruments to do an esophageal dilatation. During the procedure, the instrument used will be placed in your mouth and passed down into your esophagus. Options include:  Simple dilators. This instrument is carefully placed in the esophagus to stretch it.  Guided wire bougies. In this method, a flexible tube (endoscope) is used to insert a wire into the esophagus. The dilator is passed over this wire to enlarge the esophagus. Then the wire is removed.  Balloon dilators. An endoscope with a small balloon at the end is passed down into the esophagus. Inflating the balloon gently stretches the esophagus and opens it up. What  happens after the procedure?  Your blood pressure, heart rate, breathing rate, and blood oxygen level will be monitored often until the medicines you were given have worn off.  Your throat may feel slightly sore and will probably still feel numb. This will improve slowly over time.  You will not be allowed to eat or drink until the throat numbness has resolved.  If this is a same-day procedure, you may be allowed to go home once you have been able to drink, urinate, and sit on the edge of the bed without nausea or dizziness.  If this is a same-day procedure, you should have a friend or family member with you for the next 24 hours after the procedure. This information is not intended to replace advice given to you by your health  care provider. Make sure you discuss any questions you have with your health care provider. Document Released: 12/18/2005 Document Revised: 04/03/2016 Document Reviewed: 03/08/2014 Elsevier Interactive Patient Education  2017 [place].  Colonoscopy, Adult A colonoscopy is an exam to look at the entire large intestine. During the exam, a lubricated, bendable tube is inserted into the anus and then passed into the rectum, colon, and other parts of the large intestine. A colonoscopy is often done as a part of normal colorectal screening or in response to certain symptoms, such as anemia, persistent diarrhea, abdominal pain, and blood in the stool. The exam can help screen for and diagnose medical problems, including:  Tumors.  Polyps.  Inflammation.  Areas of bleeding. Tell a health care provider about:  Any allergies you have.  All medicines you are taking, including vitamins, herbs, eye drops, creams, and over-the-counter medicines.  Any problems you or family members have had with anesthetic medicines.  Any blood disorders you have.  Any surgeries you have had.  Any medical conditions you have.  Any problems you have had passing stool. What are  the risks? Generally, this is a safe procedure. However, problems may occur, including:  Bleeding.  A tear in the intestine.  A reaction to medicines given during the exam.  Infection (rare). What happens before the procedure? Eating and drinking restrictions  Follow instructions from your health care provider about eating and drinking, which may include:  A few days before the procedure - follow a low-fiber diet. Avoid nuts, seeds, dried fruit, raw fruits, and vegetables.  1-3 days before the procedure - follow a clear liquid diet. Drink only clear liquids, such as clear broth or bouillon, black coffee or tea, clear juice, clear soft drinks or sports drinks, gelatin dessert, and popsicles. Avoid any liquids that contain red or purple dye.  On the day of the procedure - do not eat or drink anything during the 2 hours before the procedure, or within the time period that your health care provider recommends. Bowel prep  If you were prescribed an oral bowel prep to clean out your colon:  Take it as told by your health care provider. Starting the day before your procedure, you will need to drink a large amount of medicated liquid. The liquid will cause you to have multiple loose stools until your stool is almost clear or light green.  If your skin or anus gets irritated from diarrhea, you may use these to relieve the irritation:  Medicated wipes, such as adult wet wipes with aloe and vitamin E.  A skin soothing-product like petroleum jelly.  If you vomit while drinking the bowel prep, take a break for up to 60 minutes and then begin the bowel prep again. If vomiting continues and you cannot take the bowel prep without vomiting, call your health care provider. General instructions   Ask your health care provider about changing or stopping your regular medicines. This is especially important if you are taking diabetes medicines or blood thinners.  Plan to have someone take you home from  the hospital or clinic. What happens during the procedure?  An IV tube may be inserted into one of your veins.  You will be given medicine to help you relax (sedative).  To reduce your risk of infection:  Your health care team will wash or sanitize their hands.  Your anal area will be washed with soap.  You will be asked to lie on your side with your knees  bent.  Your health care provider will lubricate a long, thin, flexible tube. The tube will have a camera and a light on the end.  The tube will be inserted into your anus.  The tube will be gently eased through your rectum and colon.  Air will be delivered into your colon to keep it open. You may feel some pressure or cramping.  The camera will be used to take images during the procedure.  A small tissue sample may be removed from your body to be examined under a microscope (biopsy). If any potential problems are found, the tissue will be sent to a lab for testing.  If small polyps are found, your health care provider may remove them and have them checked for cancer cells.  The tube that was inserted into your anus will be slowly removed. The procedure may vary among health care providers and hospitals. What happens after the procedure?  Your blood pressure, heart rate, breathing rate, and blood oxygen level will be monitored until the medicines you were given have worn off.  Do not drive for 24 hours after the exam.  You may have a small amount of blood in your stool.  You may pass gas and have mild abdominal cramping or bloating due to the air that was used to inflate your colon during the exam.  It is up to you to get the results of your procedure. Ask your health care provider, or the department performing the procedure, when your results will be ready. This information is not intended to replace advice given to you by your health care provider. Make sure you discuss any questions you have with your health care  provider. Document Released: 10/24/2000 Document Revised: 08/27/2016 Document Reviewed: 01/08/2016 Elsevier Interactive Patient Education  2017 [employer]  Colonoscopy, Adult, Care After This sheet gives you information about how to care for yourself after your procedure. Your health care provider may also give you more specific instructions. If you have problems or questions, contact your health care provider. What can I expect after the procedure? After the procedure, it is common to have:  A small amount of blood in your stool for 24 hours after the procedure.  Some gas.  Mild abdominal cramping or bloating. Follow these instructions at home: General instructions    For the first 24 hours after the procedure:  Do not drive or use machinery.  Do not sign important documents.  Do not drink alcohol.  Do your regular daily activities at a slower pace than normal.  Eat soft, easy-to-digest foods.  Rest often.  Take over-the-counter or prescription medicines only as told by your health care provider.  It is up to you to get the results of your procedure. Ask your health care provider, or the department performing the procedure, when your results will be ready. Relieving cramping and bloating   Try walking around when you have cramps or feel bloated.  Apply heat to your abdomen as told by your health care provider. Use a heat source that your health care provider recommends, such as a moist heat pack or a heating pad.  Place a towel between your skin and the heat source.  Leave the heat on for 20-30 minutes.  Remove the heat if your skin turns bright red. This is especially important if you are unable to feel pain, heat, or cold. You may have a greater risk of getting burned. Eating and drinking   Drink enough fluid to keep your  urine clear or pale yellow.  Resume your normal diet as instructed by your health care provider. Avoid heavy or fried foods that are hard to  digest.  Avoid drinking alcohol for as long as instructed by your health care provider. Contact a health care provider if:  You have blood in your stool 2-3 days after the procedure. Get help right away if:  You have more than a small spotting of blood in your stool.  You pass large blood clots in your stool.  Your abdomen is swollen.  You have nausea or vomiting.  You have a fever.  You have increasing abdominal pain that is not relieved with medicine. This information is not intended to replace advice given to you by your health care provider. Make sure you discuss any questions you have with your health care provider. Document Released: 06/10/2004 Document Revised: 07/21/2016 Document Reviewed: 01/08/2016 Elsevier Interactive Patient Education  2017 [place] Anesthesia is a term that refers to techniques, procedures, and medicines that help a person stay safe and comfortable during a medical procedure. Monitored anesthesia care, or sedation, is one type of anesthesia. Your anesthesia specialist may recommend sedation if you will be having a procedure that does not require you to be unconscious, such as:  Cataract surgery.  A dental procedure.  A biopsy.  A colonoscopy. During the procedure, you may receive a medicine to help you relax (sedative). There are three levels of sedation:  Mild sedation. At this level, you may feel awake and relaxed. You will be able to follow directions.  Moderate sedation. At this level, you will be sleepy. You may not remember the procedure.  Deep sedation. At this level, you will be asleep. You will not remember the procedure. The more medicine you are given, the deeper your level of sedation will be. Depending on how you respond to the procedure, the anesthesia specialist may change your level of sedation or the type of anesthesia to fit your needs. An anesthesia specialist will monitor you closely during the  procedure. Let your health care provider know about:  Any allergies you have.  All medicines you are taking, including vitamins, herbs, eye drops, creams, and over-the-counter medicines.  Any use of steroids (by mouth or as a cream).  Any problems you or family members have had with sedatives and anesthetic medicines.  Any blood disorders you have.  Any surgeries you have had.  Any medical conditions you have, such as sleep apnea.  Whether you are pregnant or may be pregnant.  Any use of cigarettes, alcohol, or street drugs. What are the risks? Generally, this is a safe procedure. However, problems may occur, including:  Getting too much medicine (oversedation).  Nausea.  Allergic reaction to medicines.  Trouble breathing. If this happens, a breathing tube may be used to help with breathing. It will be removed when you are awake and breathing on your own.  Heart trouble.  Lung trouble. Before the procedure Staying hydrated  Follow instructions from your health care provider about hydration, which may include:  Up to 2 hours before the procedure - you may continue to drink clear liquids, such as water, clear fruit juice, black coffee, and plain tea. Eating and drinking restrictions  Follow instructions from your health care provider about eating and drinking, which may include:  8 hours before the procedure - stop eating heavy meals or foods such as meat, fried foods, or fatty foods.  6 hours before the  procedure - stop eating light meals or foods, such as toast or cereal.  6 hours before the procedure - stop drinking milk or drinks that contain milk.  2 hours before the procedure - stop drinking clear liquids. Medicines  Ask your health care provider about:  Changing or stopping your regular medicines. This is especially important if you are taking diabetes medicines or blood thinners.  Taking medicines such as aspirin and ibuprofen. These medicines can thin your  blood. Do not take these medicines before your procedure if your health care provider instructs you not to. Tests and exams  You will have a physical exam.  You may have blood tests done to show:  How well your kidneys and liver are working.  How well your blood can clot.  General instructions  Plan to have someone take you home from the hospital or clinic.  If you will be going home right after the procedure, plan to have someone with you for 24 hours. What happens during the procedure?  Your blood pressure, heart rate, breathing, level of pain and overall condition will be monitored.  An IV tube will be inserted into one of your veins.  Your anesthesia specialist will give you medicines as needed to keep you comfortable during the procedure. This may mean changing the level of sedation.  The procedure will be performed. After the procedure  Your blood pressure, heart rate, breathing rate, and blood oxygen level will be monitored until the medicines you were given have worn off.  Do not drive for 24 hours if you received a sedative.  You may:  Feel sleepy, clumsy, or nauseous.  Feel forgetful about what happened after the procedure.  Have a sore throat if you had a breathing tube during the procedure.  Vomit. This information is not intended to replace advice given to you by your health care provider. Make sure you discuss any questions you have with your health care provider. Document Released: 07/23/2005 Document Revised: 04/04/2016 Document Reviewed: 02/17/2016 Elsevier Interactive Patient Education  2017 [place], Care After These instructions provide you with information about caring for yourself after your procedure. Your health care provider may also give you more specific instructions. Your treatment has been planned according to current medical practices, but problems sometimes occur. Call your health care provider if you have any  problems or questions after your procedure. What can I expect after the procedure? After your procedure, it is common to:  Feel sleepy for several hours.  Feel clumsy and have poor balance for several hours.  Feel forgetful about what happened after the procedure.  Have poor judgment for several hours.  Feel nauseous or vomit.  Have a sore throat if you had a breathing tube during the procedure. Follow these instructions at home: For at least 24 hours after the procedure:    Do not:  Participate in activities in which you could fall or become injured.  Drive.  Use heavy machinery.  Drink alcohol.  Take sleeping pills or medicines that cause drowsiness.  Make important decisions or sign legal documents.  Take care of children on your own.  Rest. Eating and drinking   Follow the diet that is recommended by your health care provider.  If you vomit, drink water, juice, or soup when you can drink without vomiting.  Make sure you have little or no nausea before eating solid foods. General instructions   Have a responsible adult stay with you until you  are awake and alert.  Take over-the-counter and prescription medicines only as told by your health care provider.  If you smoke, do not smoke without supervision.  Keep all follow-up visits as told by your health care provider. This is important. Contact a health care provider if:  You keep feeling nauseous or you keep vomiting.  You feel light-headed.  You develop a rash.  You have a fever. Get help right away if:  You have trouble breathing. This information is not intended to replace advice given to you by your health care provider. Make sure you discuss any questions you have with your health care provider. Document Released: 02/17/2016 Document Revised: 06/18/2016 Document Reviewed: 02/17/2016 Elsevier Interactive Patient Education  2017 [employer].

## 2017-01-22 ENCOUNTER — Telehealth: Payer: Self-pay | Admitting: Vascular Surgery

## 2017-01-22 ENCOUNTER — Emergency Department (HOSPITAL_COMMUNITY)
Admission: EM | Admit: 2017-01-22 | Discharge: 2017-01-22 | Disposition: A | Discharge status: Home / Self Care | Payer: Medicare Other | Attending: Emergency Medicine | Admitting: Emergency Medicine

## 2017-01-22 ENCOUNTER — Encounter (HOSPITAL_COMMUNITY): Payer: Self-pay | Admitting: *Deleted

## 2017-01-22 ENCOUNTER — Emergency Department (HOSPITAL_COMMUNITY): Payer: Medicare Other

## 2017-01-22 DIAGNOSIS — R197 Diarrhea, unspecified: Secondary | ICD-10-CM | POA: Diagnosis not present

## 2017-01-22 DIAGNOSIS — R1031 Right lower quadrant pain: Secondary | ICD-10-CM | POA: Insufficient documentation

## 2017-01-22 DIAGNOSIS — Z7984 Long term (current) use of oral hypoglycemic drugs: Secondary | ICD-10-CM | POA: Insufficient documentation

## 2017-01-22 DIAGNOSIS — Z7982 Long term (current) use of aspirin: Secondary | ICD-10-CM | POA: Insufficient documentation

## 2017-01-22 DIAGNOSIS — R109 Unspecified abdominal pain: Secondary | ICD-10-CM

## 2017-01-22 DIAGNOSIS — E119 Type 2 diabetes mellitus without complications: Secondary | ICD-10-CM | POA: Diagnosis not present

## 2017-01-22 DIAGNOSIS — Z87891 Personal history of nicotine dependence: Secondary | ICD-10-CM | POA: Insufficient documentation

## 2017-01-22 DIAGNOSIS — R112 Nausea with vomiting, unspecified: Secondary | ICD-10-CM | POA: Insufficient documentation

## 2017-01-22 LAB — COMPREHENSIVE METABOLIC PANEL
ALT: 34 U/L (ref 17–63)
ANION GAP: 9 (ref 5–15)
AST: 33 U/L (ref 15–41)
Albumin: 4.3 g/dL (ref 3.5–5.0)
Alkaline Phosphatase: 41 U/L (ref 38–126)
BILIRUBIN TOTAL: 0.6 mg/dL (ref 0.3–1.2)
BUN: 24 mg/dL — ABNORMAL HIGH (ref 6–20)
CHLORIDE: 101 mmol/L (ref 101–111)
CO2: 27 mmol/L (ref 22–32)
Calcium: 9.4 mg/dL (ref 8.9–10.3)
Creatinine, Ser: 0.9 mg/dL (ref 0.61–1.24)
Glucose, Bld: 142 mg/dL — ABNORMAL HIGH (ref 65–99)
POTASSIUM: 4.2 mmol/L (ref 3.5–5.1)
Sodium: 137 mmol/L (ref 135–145)
Total Protein: 7.6 g/dL (ref 6.5–8.1)

## 2017-01-22 LAB — CBC WITH DIFFERENTIAL/PLATELET
Basophils Absolute: 0 10*3/uL (ref 0.0–0.1)
Basophils Relative: 0 %
EOS ABS: 0.3 10*3/uL (ref 0.0–0.7)
EOS PCT: 3 %
HCT: 40 % (ref 39.0–52.0)
Hemoglobin: 13.3 g/dL (ref 13.0–17.0)
Lymphocytes Relative: 15 %
Lymphs Abs: 1.5 10*3/uL (ref 0.7–4.0)
MCH: 31.7 pg (ref 26.0–34.0)
MCHC: 33.3 g/dL (ref 30.0–36.0)
MCV: 95.2 fL (ref 78.0–100.0)
MONO ABS: 0.8 10*3/uL (ref 0.1–1.0)
Monocytes Relative: 9 %
Neutro Abs: 7.2 10*3/uL (ref 1.7–7.7)
Neutrophils Relative %: 73 %
PLATELETS: 171 10*3/uL (ref 150–400)
RBC: 4.2 MIL/uL — ABNORMAL LOW (ref 4.22–5.81)
RDW: 14.2 % (ref 11.5–15.5)
WBC: 9.9 10*3/uL (ref 4.0–10.5)

## 2017-01-22 LAB — URINALYSIS, ROUTINE W REFLEX MICROSCOPIC
BILIRUBIN URINE: NEGATIVE
Glucose, UA: NEGATIVE mg/dL
Hgb urine dipstick: NEGATIVE
Ketones, ur: NEGATIVE mg/dL
Leukocytes, UA: NEGATIVE
NITRITE: NEGATIVE
PROTEIN: NEGATIVE mg/dL
Specific Gravity, Urine: 1.021 (ref 1.005–1.030)
pH: 5 (ref 5.0–8.0)

## 2017-01-22 LAB — LIPASE, BLOOD: LIPASE: 89 U/L — AB (ref 11–51)

## 2017-01-22 LAB — TROPONIN I: Troponin I: <0.03 ng/mL (ref <0.03)

## 2017-01-22 MED ORDER — ONDANSETRON HCL 4 MG/2ML IJ SOLN
4.0000 mg | INTRAMUSCULAR | Status: AC | PRN
  Administered 2017-01-22 (×2): 4 mg via INTRAVENOUS
  Filled 2017-01-22 (×2): qty 2

## 2017-01-22 MED ORDER — IOPAMIDOL (ISOVUE-300) INJECTION 61%
INTRAVENOUS | Status: AC
  Administered 2017-01-22: 30 mL via ORAL
  Filled 2017-01-22: qty 30

## 2017-01-22 MED ORDER — IOPAMIDOL (ISOVUE-300) INJECTION 61%
100.0000 mL | Freq: Once | INTRAVENOUS | Status: AC | PRN
  Administered 2017-01-22: 100 mL via INTRAVENOUS

## 2017-01-22 MED ORDER — ONDANSETRON 4 MG PO TBDP: 4.0000 mg | ORAL_TABLET | Freq: Three times a day (TID) | ORAL | 0 refills | Status: DC | PRN

## 2017-01-22 MED ORDER — DICYCLOMINE HCL 10 MG/ML IM SOLN
10.0000 mg | Freq: Once | INTRAMUSCULAR | Status: AC
  Administered 2017-01-22: 10 mg via INTRAMUSCULAR
  Filled 2017-01-22: qty 2

## 2017-01-22 NOTE — ED Triage Notes (Addendum)
Pt c/o RLQ cramping abdominal pain, nausea, dry heaves x 1 week. Denies hematuria, dysuria, fever, melena. Pt has trouble with constipation, last BM yesterday. Hx diverticulosis.

## 2017-01-22 NOTE — Telephone Encounter (Signed)
-----   Message from Mena Goes, RN sent at 01/22/2017 10:59 AM EDT ----- Regarding: 4-6 weeks   ----- Message ----- From: Waynetta Sandy, MD Sent: 01/22/2017  10:25 AM To: [address]  David Duarte [phone] 05/14/1941  Needs f/u in office in 4 or so weeks

## 2017-01-22 NOTE — Telephone Encounter (Signed)
Scheduled 4/13 @ 11:45am

## 2017-01-22 NOTE — ED Notes (Signed)
Pt given ginger ale at this time.  
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
## 2017-01-22 NOTE — ED Notes (Signed)
Pt reports she vomited x 2 with CT

## 2017-01-22 NOTE — ED Notes (Signed)
Pt reports decrease in pain and has had one soft BM.

## 2017-01-22 NOTE — Discharge Instructions (Signed)
Take the prescription as directed. Stop taking the Linzess. Increase the fiber in your diet.  Call your regular GI doctor today to schedule a follow up appointment within the next 2 to 3 days.  Return to the Emergency Department immediately sooner if worsening.

## 2017-01-27 ENCOUNTER — Encounter (HOSPITAL_COMMUNITY)
Admission: RE | Admit: 2017-01-27 | Discharge: 2017-01-27 | Disposition: A | Discharge status: Home / Self Care | Payer: Medicare Other | Source: Ambulatory Visit | Attending: Internal Medicine | Admitting: Internal Medicine

## 2017-01-27 ENCOUNTER — Encounter (HOSPITAL_COMMUNITY): Payer: Self-pay

## 2017-01-27 DIAGNOSIS — Z01810 Encounter for preprocedural cardiovascular examination: Secondary | ICD-10-CM | POA: Diagnosis not present

## 2017-01-27 DIAGNOSIS — I498 Other specified cardiac arrhythmias: Secondary | ICD-10-CM | POA: Diagnosis not present

## 2017-01-27 DIAGNOSIS — Z01818 Encounter for other preprocedural examination: Secondary | ICD-10-CM | POA: Diagnosis present

## 2017-01-27 HISTORY — DX: Personal history of urinary calculi: Z87.442

## 2017-01-27 HISTORY — DX: Unspecified osteoarthritis, unspecified site: M19.90

## 2017-01-27 NOTE — Progress Notes (Signed)
   01/27/17 1102  OBSTRUCTIVE SLEEP APNEA  Have you ever been diagnosed with sleep apnea through a sleep study? No  Do you snore loudly (loud enough to be heard through closed doors)?  0  Do you often feel tired, fatigued, or sleepy during the daytime (such as falling asleep during driving or talking to someone)? 0  Has anyone observed you stop breathing during your sleep? 0  Do you have, or are you being treated for high blood pressure? 0  BMI more than 35 kg/m2? 1  Age > 50 (1-yes) 1  Neck circumference greater than:Male 16 inches or larger, Male 17inches or larger? 1  Male Gender (Yes=1) 1  Obstructive Sleep Apnea Score 4  Score 5 or greater  Results sent to PCP

## 2017-02-02 ENCOUNTER — Ambulatory Visit (HOSPITAL_COMMUNITY)
Admission: RE | Admit: 2017-02-02 | Discharge: 2017-02-02 | Disposition: A | Discharge status: Home / Self Care | Payer: Medicare Other | Source: Ambulatory Visit | Attending: Internal Medicine | Admitting: Internal Medicine

## 2017-02-02 ENCOUNTER — Ambulatory Visit (HOSPITAL_COMMUNITY): Payer: Medicare Other | Admitting: Anesthesiology

## 2017-02-02 ENCOUNTER — Encounter (HOSPITAL_COMMUNITY)
Admission: RE | Disposition: A | Discharge status: Home / Self Care | Payer: Self-pay | Source: Ambulatory Visit | Attending: Internal Medicine

## 2017-02-02 ENCOUNTER — Encounter (HOSPITAL_COMMUNITY): Payer: Self-pay | Admitting: *Deleted

## 2017-02-02 DIAGNOSIS — K3189 Other diseases of stomach and duodenum: Secondary | ICD-10-CM | POA: Diagnosis not present

## 2017-02-02 DIAGNOSIS — Z791 Long term (current) use of non-steroidal anti-inflammatories (NSAID): Secondary | ICD-10-CM | POA: Diagnosis not present

## 2017-02-02 DIAGNOSIS — K209 Esophagitis, unspecified: Secondary | ICD-10-CM

## 2017-02-02 DIAGNOSIS — K210 Gastro-esophageal reflux disease with esophagitis: Secondary | ICD-10-CM | POA: Insufficient documentation

## 2017-02-02 DIAGNOSIS — R112 Nausea with vomiting, unspecified: Secondary | ICD-10-CM | POA: Diagnosis not present

## 2017-02-02 DIAGNOSIS — E7800 Pure hypercholesterolemia, unspecified: Secondary | ICD-10-CM | POA: Insufficient documentation

## 2017-02-02 DIAGNOSIS — R1030 Lower abdominal pain, unspecified: Secondary | ICD-10-CM | POA: Insufficient documentation

## 2017-02-02 DIAGNOSIS — Z86711 Personal history of pulmonary embolism: Secondary | ICD-10-CM | POA: Diagnosis not present

## 2017-02-02 DIAGNOSIS — K5730 Diverticulosis of large intestine without perforation or abscess without bleeding: Secondary | ICD-10-CM | POA: Insufficient documentation

## 2017-02-02 DIAGNOSIS — D124 Benign neoplasm of descending colon: Secondary | ICD-10-CM | POA: Diagnosis not present

## 2017-02-02 DIAGNOSIS — Z87891 Personal history of nicotine dependence: Secondary | ICD-10-CM | POA: Insufficient documentation

## 2017-02-02 DIAGNOSIS — D123 Benign neoplasm of transverse colon: Secondary | ICD-10-CM | POA: Insufficient documentation

## 2017-02-02 DIAGNOSIS — Z8719 Personal history of other diseases of the digestive system: Secondary | ICD-10-CM | POA: Diagnosis not present

## 2017-02-02 DIAGNOSIS — R1310 Dysphagia, unspecified: Secondary | ICD-10-CM | POA: Diagnosis not present

## 2017-02-02 DIAGNOSIS — Z7984 Long term (current) use of oral hypoglycemic drugs: Secondary | ICD-10-CM | POA: Diagnosis not present

## 2017-02-02 DIAGNOSIS — R109 Unspecified abdominal pain: Secondary | ICD-10-CM

## 2017-02-02 DIAGNOSIS — Z86010 Personal history of colonic polyps: Secondary | ICD-10-CM | POA: Insufficient documentation

## 2017-02-02 DIAGNOSIS — E119 Type 2 diabetes mellitus without complications: Secondary | ICD-10-CM | POA: Insufficient documentation

## 2017-02-02 DIAGNOSIS — K5900 Constipation, unspecified: Secondary | ICD-10-CM | POA: Diagnosis not present

## 2017-02-02 DIAGNOSIS — Z7982 Long term (current) use of aspirin: Secondary | ICD-10-CM | POA: Insufficient documentation

## 2017-02-02 DIAGNOSIS — K766 Portal hypertension: Secondary | ICD-10-CM | POA: Insufficient documentation

## 2017-02-02 DIAGNOSIS — K449 Diaphragmatic hernia without obstruction or gangrene: Secondary | ICD-10-CM | POA: Diagnosis not present

## 2017-02-02 DIAGNOSIS — F419 Anxiety disorder, unspecified: Secondary | ICD-10-CM | POA: Insufficient documentation

## 2017-02-02 DIAGNOSIS — Z79899 Other long term (current) drug therapy: Secondary | ICD-10-CM | POA: Insufficient documentation

## 2017-02-02 HISTORY — PX: MALONEY DILATION: SHX5535

## 2017-02-02 HISTORY — PX: COLONOSCOPY WITH PROPOFOL: SHX5780

## 2017-02-02 HISTORY — PX: ESOPHAGOGASTRODUODENOSCOPY (EGD) WITH PROPOFOL: SHX5813

## 2017-02-02 HISTORY — PX: POLYPECTOMY: SHX5525

## 2017-02-02 LAB — GLUCOSE, CAPILLARY
Glucose-Capillary: 160 mg/dL — ABNORMAL HIGH (ref 65–99)
Glucose-Capillary: 184 mg/dL — ABNORMAL HIGH (ref 65–99)

## 2017-02-02 SURGERY — COLONOSCOPY WITH PROPOFOL
Anesthesia: Monitor Anesthesia Care

## 2017-02-02 MED ORDER — PROPOFOL 10 MG/ML IV BOLUS
INTRAVENOUS | Status: DC | PRN
  Administered 2017-02-02 (×3): 20 mg via INTRAVENOUS

## 2017-02-02 MED ORDER — FENTANYL CITRATE (PF) 100 MCG/2ML IJ SOLN
25.0000 ug | INTRAMUSCULAR | Status: AC
  Administered 2017-02-02 (×2): 25 ug via INTRAVENOUS

## 2017-02-02 MED ORDER — CHLORHEXIDINE GLUCONATE CLOTH 2 % EX PADS: 6.0000 | MEDICATED_PAD | Freq: Once | CUTANEOUS | Status: DC

## 2017-02-02 MED ORDER — CHLORHEXIDINE GLUCONATE CLOTH 2 % EX PADS
6.0000 | MEDICATED_PAD | Freq: Once | CUTANEOUS | Status: DC
Start: 2017-02-02 — End: 2017-02-02

## 2017-02-02 MED ORDER — LIDOCAINE VISCOUS 2 % MT SOLN
5.0000 mL | Freq: Once | OROMUCOSAL | Status: AC
  Administered 2017-02-02: 5 mL via OROMUCOSAL

## 2017-02-02 MED ORDER — MIDAZOLAM HCL 2 MG/2ML IJ SOLN
1.0000 mg | INTRAMUSCULAR | Status: AC
  Administered 2017-02-02: 2 mg via INTRAVENOUS

## 2017-02-02 MED ORDER — PROPOFOL 500 MG/50ML IV EMUL
INTRAVENOUS | Status: DC | PRN
  Administered 2017-02-02: 75 ug/kg/min via INTRAVENOUS
  Administered 2017-02-02: 65 ug/kg/min via INTRAVENOUS

## 2017-02-02 MED ORDER — MIDAZOLAM HCL 2 MG/2ML IJ SOLN
INTRAMUSCULAR | Status: AC
  Filled 2017-02-02: qty 2

## 2017-02-02 MED ORDER — FENTANYL CITRATE (PF) 100 MCG/2ML IJ SOLN
INTRAMUSCULAR | Status: AC
  Filled 2017-02-02: qty 2

## 2017-02-02 MED ORDER — LIDOCAINE VISCOUS 2 % MT SOLN
OROMUCOSAL | Status: AC
  Filled 2017-02-02: qty 15

## 2017-02-02 MED ORDER — LACTATED RINGERS IV SOLN
INTRAVENOUS | Status: DC
  Administered 2017-02-02: 08:00:00 via INTRAVENOUS
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
## 2017-02-02 NOTE — Op Note (Signed)
[facility] Patient Name: David Duarte Procedure Date: 02/02/2017 8:09 AM MRN: [id number] Date of Birth: Mar 29, 1941 Attending MD: Norvel Richards , MD CSN: [id number] Age: 76 Admit Type: Outpatient Procedure:                Upper GI endoscopy with [facility] dilation Indications:              Dysphagia Providers:                Norvel Richards, MD, Lurline Del, RN, Purcell Nails.                            Jameson, [employer] Referring MD:              Medicines:                Propofol per Anesthesia Complications:            No immediate complications. Estimated Blood Loss:     Estimated blood loss: none. Procedure:                Pre-Anesthesia Assessment:                           - Prior to the procedure, a History and Physical                            was performed, and patient medications and                            allergies were reviewed. The patient's tolerance of                            previous anesthesia was also reviewed. The risks                            and benefits of the procedure and the sedation                            options and risks were discussed with the patient.                            All questions were answered, and informed consent                            was obtained. Prior Anticoagulants: The patient has                            taken no previous anticoagulant or antiplatelet                            agents. ASA Grade Assessment: III - A patient with                            severe systemic disease. After reviewing the risks  and benefits, the patient was deemed in                            satisfactory condition to undergo the procedure.                           After obtaining informed consent, the endoscope was                            passed under direct vision. Throughout the                            procedure, the patient's blood pressure, pulse, and   oxygen saturations were monitored continuously. The                            [id number] ([id number]) scope was introduced through the                            mouth, and advanced to the second part of duodenum.                            The upper GI endoscopy was accomplished without                            difficulty. The patient tolerated the procedure                            well. 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 Stomach       otherwise appeared normal. No gastric or esophageal varices.      The duodenal bulb and second portion of the duodenum were normal.      LA Grade B (one or more mucosal breaks greater than 5 mm, not extending       between the tops of two mucosal folds) esophagitis was found 35 to 37 cm       from the incisors. no Barrett's epithelium seen. 56 and 58 [place]       Maloney dilators passed in sequence. No resistance after passing both       dilators. Look back revealed no complication related to these maneuvers. Impression:               - LA Grade B esophagitis. Esophagus dilated.- No                            specimens collected. gastric mucosal changes                            consistent with portal gastropathy. Moderate Sedation:      Moderate (conscious) sedation was personally administered by an       anesthesia professional. The following parameters were monitored: oxygen       saturation, heart rate, blood pressure, respiratory rate, EKG, adequacy  of pulmonary ventilation, and response to care. Total physician       intraservice time was 14 minutes. Recommendation:           - Patient has a contact number available for                            emergencies. The signs and symptoms of potential                            delayed complications were discussed with the                            patient.  Return to normal activities tomorrow.                            Written discharge instructions were provided to the                            patient. Continue Nexium 40 mg daily. Office visit                            with [place] in 4 weeks                           - Resume previous diet.                           - Continue present medications.                           - No repeat upper endoscopy.                           - Return to GI office in 3 months. See colonoscopy                            report. Procedure Code(s):        --- Professional ---                           [phone], Esophagogastroduodenoscopy, flexible,                            transoral; diagnostic, including collection of                            specimen(s) by brushing or washing, when performed                            (separate procedure) Diagnosis Code(s):        --- Professional ---                           K20.9, Esophagitis, unspecified                           R13.10,  Dysphagia, unspecified CPT copyright 2016 American Medical Association. All rights reserved. The codes documented in this report are preliminary and upon coder review may  be revised to meet current compliance requirements. [name]. [name], MD Norvel Richards, MD 02/02/2017 9:20:26 AM This report has been signed electronically. Number of Addenda: 0

## 2017-02-02 NOTE — Anesthesia Procedure Notes (Signed)
Procedure Name: MAC Date/Time: 02/02/2017 8:01 AM Performed by: Vista Deck Pre-anesthesia Checklist: Patient identified, Emergency Drugs available, Suction available, Timeout performed and Patient being monitored Patient Re-evaluated:Patient Re-evaluated prior to inductionOxygen Delivery Method: Non-rebreather mask

## 2017-02-02 NOTE — Discharge Instructions (Addendum)
°Colonoscopy °Discharge Instructions ° °Read the instructions outlined below and refer to this sheet in the next few weeks. These discharge instructions provide you with general information on caring for yourself after you leave the hospital. Your doctor may also give you specific instructions. While your treatment has been planned according to the most current medical practices available, unavoidable complications occasionally occur. If you have any problems or questions after discharge, call Dr. Rourk at [phone]. °ACTIVITY °· You may resume your regular activity, but move at a slower pace for the next 24 hours.  °· Take frequent rest periods for the next 24 hours.  °· Walking will help get rid of the air and reduce the bloated feeling in your belly (abdomen).  °· No driving for 24 hours (because of the medicine (anesthesia) used during the test).   °· Do not sign any important legal documents or operate any machinery for 24 hours (because of the anesthesia used during the test).  °NUTRITION °· Drink plenty of fluids.  °· You may resume your normal diet as instructed by your doctor.  °· Begin with a light meal and progress to your normal diet. Heavy or fried foods are harder to digest and may make you feel sick to your stomach (nauseated).  °· Avoid alcoholic beverages for 24 hours or as instructed.  °MEDICATIONS °· You may resume your normal medications unless your doctor tells you otherwise.  °WHAT YOU CAN EXPECT TODAY °· Some feelings of bloating in the abdomen.  °· Passage of more gas than usual.  °· Spotting of blood in your stool or on the toilet paper.  °IF YOU HAD POLYPS REMOVED DURING THE COLONOSCOPY: °· No aspirin products for 7 days or as instructed.  °· No alcohol for 7 days or as instructed.  °· Eat a soft diet for the next 24 hours.  °FINDING OUT THE RESULTS OF YOUR TEST °Not all test results are available during your visit. If your test results are not back during the visit, make an appointment  with your caregiver to find out the results. Do not assume everything is normal if you have not heard from your caregiver or the medical facility. It is important for you to follow up on all of your test results.  °SEEK IMMEDIATE MEDICAL ATTENTION IF: °· You have more than a spotting of blood in your stool.  °· Your belly is swollen (abdominal distention).  °· You are nauseated or vomiting.  °· You have a temperature over 101.  °· You have abdominal pain or discomfort that is severe or gets worse throughout the day.  °EGD °Discharge instructions °Please read the instructions outlined below and refer to this sheet in the next few weeks. These discharge instructions provide you with general information on caring for yourself after you leave the hospital. Your doctor may also give you specific instructions. While your treatment has been planned according to the most current medical practices available, unavoidable complications occasionally occur. If you have any problems or questions after discharge, please call your doctor. °ACTIVITY °· You may resume your regular activity but move at a slower pace for the next 24 hours.  °· Take frequent rest periods for the next 24 hours.  °· Walking will help expel (get rid of) the air and reduce the bloated feeling in your abdomen.  °· No driving for 24 hours (because of the anesthesia (medicine) used during the test).  °· You may shower.  °· Do not sign any important   legal documents or operate any machinery for 24 hours (because of the anesthesia used during the test).  NUTRITION  Drink plenty of fluids.   You may resume your normal diet.   Begin with a light meal and progress to your normal diet.   Avoid alcoholic beverages for 24 hours or as instructed by your caregiver.  MEDICATIONS  You may resume your normal medications unless your caregiver tells you otherwise.  WHAT YOU CAN EXPECT TODAY  You may experience abdominal discomfort such as a feeling of fullness  or gas pains.  FOLLOW-UP  Your doctor will discuss the results of your test with you.  SEEK IMMEDIATE MEDICAL ATTENTION IF ANY OF THE FOLLOWING OCCUR:  Excessive nausea (feeling sick to your stomach) and/or vomiting.   Severe abdominal pain and distention (swelling).   Trouble swallowing.   Temperature over 101 F (37.8 C).   Rectal bleeding or vomiting of blood.    GERD and colon polyp information provided  Begin Protonix 40 mg daily  Further recommendations to follow pending review of pathology report  Office visit with us-keep April 26 appointment.    Gastroesophageal Reflux Disease, Adult Normally, food travels down the esophagus and stays in the stomach to be digested. If a person has gastroesophageal reflux disease (GERD), food and stomach acid move back up into the esophagus. When this happens, the esophagus becomes sore and swollen (inflamed). Over time, GERD can make small holes (ulcers) in the lining of the esophagus. Follow these instructions at home: Diet   Follow a diet as told by your doctor. You may need to avoid foods and drinks such as:  Coffee and tea (with or without caffeine).  Drinks that contain alcohol.  Energy drinks and sports drinks.  Carbonated drinks or sodas.  Chocolate and cocoa.  Peppermint and mint flavorings.  Garlic and onions.  Horseradish.  Spicy and acidic foods, such as peppers, chili powder, curry powder, vinegar, hot sauces, and BBQ sauce.  Citrus fruit juices and citrus fruits, such as oranges, lemons, and limes.  Tomato-based foods, such as red sauce, chili, salsa, and pizza with red sauce.  Fried and fatty foods, such as donuts, french fries, potato chips, and high-fat dressings.  High-fat meats, such as hot dogs, rib eye steak, sausage, ham, and bacon.  High-fat dairy items, such as whole milk, butter, and cream cheese.  Eat small meals often. Avoid eating large meals.  Avoid drinking large amounts of liquid  with your meals.  Avoid eating meals during the 2-3 hours before bedtime.  Avoid lying down right after you eat.  Do not exercise right after you eat. General instructions   Pay attention to any changes in your symptoms.  Take over-the-counter and prescription medicines only as told by your doctor. Do not take aspirin, ibuprofen, or other NSAIDs unless your doctor says it is okay.  Do not use any tobacco products, including cigarettes, chewing tobacco, and e-cigarettes. If you need help quitting, ask your doctor.  Wear loose clothes. Do not wear anything tight around your waist.  Raise (elevate) the head of your bed about 6 inches (15 cm).  Try to lower your stress. If you need help doing this, ask your doctor.  If you are overweight, lose an amount of weight that is healthy for you. Ask your doctor about a safe weight loss goal.  Keep all follow-up visits as told by your doctor. This is important. Contact a doctor if:  You have new symptoms.  You  lose weight and you do not know why it is happening.  You have trouble swallowing, or it hurts to swallow.  You have wheezing or a cough that keeps happening.  Your symptoms do not get better with treatment.  You have a hoarse voice. Get help right away if:  You have pain in your arms, neck, jaw, teeth, or back.  You feel sweaty, dizzy, or light-headed.  You have chest pain or shortness of breath.  You throw up (vomit) and your throw up looks like blood or coffee grounds.  You pass out (faint).  Your poop (stool) is bloody or black.  You cannot swallow, drink, or eat. This information is not intended to replace advice given to you by your health care provider. Make sure you discuss any questions you have with your health care provider. Document Released: 04/14/2008 Document Revised: 04/03/2016 Document Reviewed: 02/21/2015 Elsevier Interactive Patient Education  2017 [place].    Colon Polyps Polyps are tissue  growths inside the body. Polyps can grow in many places, including the large intestine (colon). A polyp may be a round bump or a mushroom-shaped growth. You could have one polyp or several. Most colon polyps are noncancerous (benign). However, some colon polyps can become cancerous over time. What are the causes? The exact cause of colon polyps is not known. What increases the risk? This condition is more likely to develop in people who:  Have a family history of colon cancer or colon polyps.  Are older than 83 or older than 45 if they are African American.  Have inflammatory bowel disease, such as ulcerative colitis or Crohn disease.  Are overweight.  Smoke cigarettes.  Do not get enough exercise.  Drink too much alcohol.  Eat a diet that is:  High in fat and red meat.  Low in fiber.  Had childhood cancer that was treated with abdominal radiation. What are the signs or symptoms? Most polyps do not cause symptoms. If you have symptoms, they may include:  Blood coming from your rectum when having a bowel movement.  Blood in your stool.The stool may look dark red or black.  A change in bowel habits, such as constipation or diarrhea. How is this diagnosed? This condition is diagnosed with a colonoscopy. This is a procedure that uses a lighted, flexible scope to look at the inside of your colon. How is this treated? Treatment for this condition involves removing any polyps that are found. Those polyps will then be tested for cancer. If cancer is found, your health care provider will talk to you about options for colon cancer treatment. Follow these instructions at home: Diet   Eat plenty of fiber, such as fruits, vegetables, and whole grains.  Eat foods that are high in calcium and vitamin D, such as milk, cheese, yogurt, eggs, liver, fish, and broccoli.  Limit foods high in fat, red meats, and processed meats, such as hot dogs, sausage, bacon, and lunch meats.  Maintain  a healthy weight, or lose weight if recommended by your health care provider. General instructions   Do not smoke cigarettes.  Do not drink alcohol excessively.  Keep all follow-up visits as told by your health care provider. This is important. This includes keeping regularly scheduled colonoscopies. Talk to your health care provider about when you need a colonoscopy.  Exercise every day or as told by your health care provider. Contact a health care provider if:  You have new or worsening bleeding during a bowel movement.  You have new or increased blood in your stool.  You have a change in bowel habits.  You unexpectedly lose weight. This information is not intended to replace advice given to you by your health care provider. Make sure you discuss any questions you have with your health care provider. Document Released: 07/23/2004 Document Revised: 04/03/2016 Document Reviewed: 09/17/2015 Elsevier Interactive Patient Education  2017 [employer].

## 2017-02-02 NOTE — Anesthesia Postprocedure Evaluation (Signed)
Anesthesia Post Note  Patient: David Duarte  Procedure(s) Performed: Procedure(s) (LRB): COLONOSCOPY WITH PROPOFOL (N/A) ESOPHAGOGASTRODUODENOSCOPY (EGD) WITH PROPOFOL (N/A) MALONEY DILATION (N/A) POLYPECTOMY  Patient location during evaluation: PACU Anesthesia Type: MAC Level of consciousness: awake and alert Pain management: satisfactory to patient Vital Signs Assessment: post-procedure vital signs reviewed and stable Respiratory status: spontaneous breathing and patient connected to nasal cannula oxygen Cardiovascular status: stable Anesthetic complications: no     Last Vitals:  Vitals:   02/02/17 0750 02/02/17 0755  BP: (!) 144/82 (!) 145/88  Pulse:    Resp: 13 18  Temp:      Last Pain:  Vitals:   02/02/17 0731  TempSrc: Oral                 Sonda Coppens

## 2017-02-02 NOTE — Anesthesia Preprocedure Evaluation (Signed)
Anesthesia Evaluation  Patient identified by MRN, date of birth, ID band Patient awake    Reviewed: Allergy & Precautions, NPO status , Patient's Chart, lab work & pertinent test results  Airway Mallampati: II  TM Distance: >3 FB     Dental  (+) Edentulous Upper, Edentulous Lower   Pulmonary former smoker,    breath sounds clear to auscultation       Cardiovascular negative cardio ROS   Rhythm:Regular Rate:Normal     Neuro/Psych PSYCHIATRIC DISORDERS Anxiety    GI/Hepatic hiatal hernia, GERD  ,  Endo/Other  diabetes, Type 2, Oral Hypoglycemic Agents  Renal/GU      Musculoskeletal   Abdominal   Peds  Hematology   Anesthesia Other Findings   Reproductive/Obstetrics                             Anesthesia Physical Anesthesia Plan  ASA: III  Anesthesia Plan: MAC   Post-op Pain Management:    Induction: Intravenous  Airway Management Planned: Simple Face Mask  Additional Equipment:   Intra-op Plan:   Post-operative Plan:   Informed Consent: I have reviewed the patients History and Physical, chart, labs and discussed the procedure including the risks, benefits and alternatives for the proposed anesthesia with the patient or authorized representative who has indicated his/her understanding and acceptance.     Plan Discussed with:   Anesthesia Plan Comments:         Anesthesia Quick Evaluation

## 2017-02-02 NOTE — Op Note (Signed)
[facility] Patient Name: David Duarte Procedure Date: 02/02/2017 8:22 AM MRN: [id number] Date of Birth: 1940-12-05 Attending MD: Norvel Richards , MD CSN: [id number] Age: 76 Admit Type: Outpatient Procedure:                Colonoscopy with multipl snare polypectomies Indications:              High risk colon cancer surveillance: Personal                            history of colonic polyps Providers:                Norvel Richards, MD, Lurline Del, RN, Purcell Nails.                            [place], Merchant navy officer Referring MD:              Medicines:                Propofol per Anesthesia Complications:            No immediate complications. Estimated Blood Loss:     Estimated blood loss was minimal. Procedure:                Pre-Anesthesia Assessment:                           - Prior to the procedure, a History and Physical                            was performed, and patient medications and                            allergies were reviewed. The patient's tolerance of                            previous anesthesia was also reviewed. The risks                            and benefits of the procedure and the sedation                            options and risks were discussed with the patient.                            All questions were answered, and informed consent                            was obtained. Prior Anticoagulants: The patient has                            taken no previous anticoagulant or antiplatelet                            agents. ASA Grade Assessment: III - A patient with  severe systemic disease. After reviewing the risks                            and benefits, the patient was deemed in                            satisfactory condition to undergo the procedure.                           After obtaining informed consent, the colonoscope                            was passed under direct vision. Throughout the                   procedure, the patient's blood pressure, pulse, and                            oxygen saturations were monitored continuously. The                            EC-[id number] ([id number]) scope was introduced through                            the and advanced to the the cecum, identified by                            appendiceal orifice and ileocecal valve. The                            ileocecal valve, appendiceal orifice, and rectum                            were photographed. The entire colon was well                            visualized. The colonoscopy was performed without                            difficulty. The patient tolerated the procedure                            well. The quality of the bowel preparation was                            adequate. Scope In: 8:25:54 AM Scope Out: 8:54:10 AM Scope Withdrawal Time: 0 hours 21 minutes 19 seconds  Total Procedure Duration: 0 hours 28 minutes 16 seconds  Findings:      The perianal and digital rectal examinations were normal.      Three semi-pedunculated polyps were found in the descending colon,       splenic flexure and hepatic flexure. The polyps were 4 to 7 mm in size.       These polyps were removed with a cold snare. Resection and retrieval       were complete. Estimated blood loss  was minimal.      Multiple small and large-mouthed diverticula were found in the entire       colon.      The exam was otherwise without abnormality on direct and retroflexion       views. Impression:               - Three 4 to 7 mm polyps in the descending colon,                            at the splenic flexure and at the hepatic flexure,                            removed with a cold snare. Resected and retrieved.                           - Diverticulosis in the entire examined colon. Moderate Sedation:      Moderate (conscious) sedation was personally administered by an       anesthesia professional. The following parameters were  monitored: oxygen       saturation, heart rate, blood pressure, respiratory rate, EKG, adequacy       of pulmonary ventilation, and response to care. Total physician       intraservice time was 48 minutes. Recommendation:           - Patient has a contact number available for                            emergencies. The signs and symptoms of potential                            delayed complications were discussed with the                            patient. Return to normal activities tomorrow.                            Written discharge instructions were provided to the                            patient.                           - Resume previous diet.                           - Continue present medications.                           - Await pathology results.                           - Repeat colonoscopy date to be determined after                            pending pathology results are reviewed for  surveillance based on pathology results.                           - Return to GI clinic in 3 months. Procedure Code(s):        --- Professional ---                           [phone], Colonoscopy, flexible; with removal of                            tumor(s), polyp(s), or other lesion(s) by snare                            technique Diagnosis Code(s):        --- Professional ---                           Z86.010, Personal history of colonic polyps                           D12.4, Benign neoplasm of descending colon                           D12.3, Benign neoplasm of transverse colon (hepatic                            flexure or splenic flexure)                           K57.30, Diverticulosis of large intestine without                            perforation or abscess without bleeding CPT copyright 2016 American Medical Association. All rights reserved. The codes documented in this report are preliminary and upon coder review may  be revised to meet current  compliance requirements. [name]. Nirvaan Frett, MD Norvel Richards, MD 02/02/2017 9:24:56 AM This report has been signed electronically. Number of Addenda: 0

## 2017-02-02 NOTE — Interval H&P Note (Signed)
History and Physical Interval Note:  02/02/2017 7:31 AM  David Duarte  has presented today for surgery, with the diagnosis of ABD PAIN,N/V, DYSPHAGIA  The various methods of treatment have been discussed with the patient and family. After consideration of risks, benefits and other options for treatment, the patient has consented to  Procedure(s) with comments: COLONOSCOPY WITH PROPOFOL (N/A) - 815  ESOPHAGOGASTRODUODENOSCOPY (EGD) WITH PROPOFOL (N/A) MALONEY DILATION (N/A) as a surgical intervention .  The patient's history has been reviewed, patient examined, no change in status, stable for surgery.  I have reviewed the patient's chart and labs.  Questions were answered to the patient's satisfaction.     David Duarte  No change. EGD with ED and surveillance colonoscopy plan. .  The risks, benefits, limitations, imponderables and alternatives regarding both EGD and colonoscopy have been reviewed with the patient. Questions have been answered. All parties agreeable.

## 2017-02-02 NOTE — Transfer of Care (Signed)
Immediate Anesthesia Transfer of Care Note  Patient: David Duarte  Procedure(s) Performed: Procedure(s) with comments: COLONOSCOPY WITH PROPOFOL (N/A) - 815  ESOPHAGOGASTRODUODENOSCOPY (EGD) WITH PROPOFOL (N/A) MALONEY DILATION (N/A) POLYPECTOMY - hepatic, splenic, and decending  Patient Location: PACU  Anesthesia Type:MAC  Level of Consciousness: awake  Airway & Oxygen Therapy: Patient Spontanous Breathing and Patient connected to nasal cannula oxygen  Post-op Assessment: Report given to RN and Post -op Vital signs reviewed and stable  Post vital signs: Reviewed and stable  Last Vitals:  Vitals:   02/02/17 0750 02/02/17 0755  BP: (!) 144/82 (!) 145/88  Pulse:    Resp: 13 18  Temp:      Last Pain:  Vitals:   02/02/17 0731  TempSrc: Oral      Patients Stated Pain Goal: 8 (96/28/36 6294)  Complications: No apparent anesthesia complications

## 2017-02-03 ENCOUNTER — Encounter: Payer: Self-pay | Admitting: Internal Medicine

## 2017-02-03 ENCOUNTER — Telehealth: Payer: Self-pay | Admitting: Internal Medicine

## 2017-02-03 NOTE — Telephone Encounter (Signed)
Pt's wife called to say that patient had colonoscopy done by RMR yesterday and he would like to get the brand name protonix 40 mg instead of generic due to Insurance. [facility] in [place].

## 2017-02-03 NOTE — Telephone Encounter (Signed)
Dr.Rourk, is it ok if I send in a new rx for brand only protonix?

## 2017-02-04 MED ORDER — PROTONIX 40 MG PO TBEC: 40.0000 mg | DELAYED_RELEASE_TABLET | Freq: Every day | ORAL | 11 refills | Status: DC

## 2017-02-04 NOTE — Telephone Encounter (Signed)
rx has been sent in to the [facility].

## 2017-02-04 NOTE — Addendum Note (Signed)
Addended by: Claudina Lick on: 02/04/2017 08:51 AM   Modules accepted: Orders

## 2017-02-04 NOTE — Telephone Encounter (Signed)
yes

## 2017-02-09 ENCOUNTER — Encounter (HOSPITAL_COMMUNITY): Payer: Self-pay | Admitting: Internal Medicine

## 2017-02-20 ENCOUNTER — Ambulatory Visit: Payer: Medicare Other | Admitting: Vascular Surgery

## 2017-02-23 ENCOUNTER — Telehealth (INDEPENDENT_AMBULATORY_CARE_PROVIDER_SITE_OTHER): Payer: Self-pay | Admitting: Orthopaedic Surgery

## 2017-02-23 MED ORDER — MELOXICAM 15 MG PO TABS: 15.0000 mg | ORAL_TABLET | Freq: Every day | ORAL | 0 refills | Status: DC

## 2017-02-23 NOTE — Addendum Note (Signed)
Addended by: Meyer Cory on: 02/23/2017 02:41 PM   Modules accepted: Orders

## 2017-02-23 NOTE — Telephone Encounter (Signed)
Please advise 

## 2017-02-23 NOTE — Telephone Encounter (Signed)
Script entered. Patient advised.  

## 2017-02-23 NOTE — Telephone Encounter (Signed)
OK thank you 

## 2017-02-23 NOTE — Telephone Encounter (Signed)
PT REQUESTS REFILL OF MELOXICAM 15MG .  PLEASE ADVISE.   [phone]

## 2017-02-27 ENCOUNTER — Encounter: Payer: Self-pay | Admitting: Vascular Surgery

## 2017-03-05 ENCOUNTER — Ambulatory Visit: Payer: Medicare Other | Admitting: Nurse Practitioner

## 2017-03-06 ENCOUNTER — Ambulatory Visit (INDEPENDENT_AMBULATORY_CARE_PROVIDER_SITE_OTHER): Payer: Medicare (Managed Care) | Admitting: Vascular Surgery

## 2017-03-06 ENCOUNTER — Encounter: Payer: Self-pay | Admitting: Vascular Surgery

## 2017-03-06 VITALS — BP 150/83 | HR 66 | Temp 98.2°F | Resp 20 | Ht 72.0 in | Wt 268.0 lb

## 2017-03-06 DIAGNOSIS — I714 Abdominal aortic aneurysm, without rupture, unspecified: Secondary | ICD-10-CM
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
## 2017-03-09 NOTE — Addendum Note (Signed)
Addended by: Lianne Cure A on: 03/09/2017 09:17 AM   Modules accepted: Orders

## 2017-04-13 ENCOUNTER — Ambulatory Visit (INDEPENDENT_AMBULATORY_CARE_PROVIDER_SITE_OTHER): Payer: Medicare (Managed Care) | Admitting: Nurse Practitioner

## 2017-04-13 ENCOUNTER — Encounter: Payer: Self-pay | Admitting: Nurse Practitioner

## 2017-04-13 VITALS — BP 148/82 | HR 75 | Temp 98.3°F | Ht 71.0 in | Wt 262.0 lb

## 2017-04-13 DIAGNOSIS — R1319 Other dysphagia: Secondary | ICD-10-CM | POA: Diagnosis not present

## 2017-04-13 DIAGNOSIS — K2100 Gastro-esophageal reflux disease with esophagitis, without bleeding: Secondary | ICD-10-CM

## 2017-04-13 DIAGNOSIS — K5900 Constipation, unspecified: Secondary | ICD-10-CM

## 2017-04-13 NOTE — Progress Notes (Signed)
cc'ed to pcp °

## 2017-04-13 NOTE — Patient Instructions (Signed)
1. We will request her ultrasound from [facility] (now [facility]). 2. Continue taking your acid blocker daily. 3. Continue taking MiraLAX. 4. Return for follow-up in 6 months. 5. Call us if you have any worsening or severe symptoms before then.

## 2017-04-13 NOTE — Assessment & Plan Note (Signed)
History of GERD, currently on PPI daily. Recommend continue PPI. Just simply his EGD did show findings suggestive of portal hypertensive gastropathy. Recent CT abdomen showed hepatic steatosis/fatty liver but no scarring. He did just have right upper quadrant ultrasound at [facility] to evaluate his gallbladder and our request those records to look for any liver changes. Overall, given his normal labs, no scarring or changes indicative of cirrhosis noted on CT as low suspicion for cirrhosis. Return for follow-up in 6 months. Call if any worsening or severe symptoms before then.

## 2017-04-13 NOTE — Assessment & Plan Note (Signed)
Constipation currently well managed on MiraLAX once a day. I've informed him that he can take it up to twice a day. Linzess caused abdominal cramping. Continue to take MiraLAX, return for follow-up in 6 months. Notify us of any worsening or severe symptoms.
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
## 2017-04-13 NOTE — Assessment & Plan Note (Signed)
Dysphagia symptoms resolved after EGD with dilation. No ongoing swallowing problems at this time. Recommended continue PPI for LA grade B esophagitis. Return for follow-up in 6 months for long-term symptom progression. Notify us of any worsening or severe symptoms before then.

## 2017-04-23 ENCOUNTER — Telehealth (INDEPENDENT_AMBULATORY_CARE_PROVIDER_SITE_OTHER): Payer: Self-pay | Admitting: Radiology

## 2017-04-23 MED ORDER — MELOXICAM 15 MG PO TABS: 15.0000 mg | ORAL_TABLET | Freq: Every day | ORAL | 0 refills | Status: DC

## 2017-04-23 NOTE — Telephone Encounter (Signed)
I called patient and advised, script sent to pharmacy.

## 2017-04-23 NOTE — Telephone Encounter (Signed)
Patient requests refill of Mobic. He would like sent to [facility] in [place].    OK for refill?  Pt CB [phone]

## 2017-04-23 NOTE — Telephone Encounter (Signed)
Ok thx.

## 2017-04-23 NOTE — Telephone Encounter (Signed)
Script entered and sent to the pharmacy.

## 2017-04-23 NOTE — Addendum Note (Signed)
Addended by: Meyer Cory on: 04/23/2017 01:17 PM   Modules accepted: Orders

## 2017-05-28 ENCOUNTER — Ambulatory Visit (INDEPENDENT_AMBULATORY_CARE_PROVIDER_SITE_OTHER): Payer: PRIVATE HEALTH INSURANCE | Admitting: Orthopaedic Surgery

## 2017-05-28 ENCOUNTER — Encounter (INDEPENDENT_AMBULATORY_CARE_PROVIDER_SITE_OTHER): Payer: Self-pay | Admitting: Orthopaedic Surgery

## 2017-05-28 VITALS — BP 133/76 | HR 73 | Ht 72.0 in | Wt 260.0 lb

## 2017-05-28 DIAGNOSIS — M170 Bilateral primary osteoarthritis of knee: Secondary | ICD-10-CM | POA: Diagnosis not present
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
## 2017-09-02 ENCOUNTER — Ambulatory Visit (HOSPITAL_COMMUNITY)
Admission: RE | Admit: 2017-09-02 | Discharge: 2017-09-02 | Disposition: A | Discharge status: Home / Self Care | Payer: Medicare Other | Source: Ambulatory Visit | Attending: Vascular Surgery | Admitting: Vascular Surgery

## 2017-09-02 ENCOUNTER — Ambulatory Visit (HOSPITAL_COMMUNITY): Payer: Medicare Other

## 2017-09-02 DIAGNOSIS — I714 Abdominal aortic aneurysm, without rupture, unspecified: Secondary | ICD-10-CM

## 2017-09-02 DIAGNOSIS — N400 Enlarged prostate without lower urinary tract symptoms: Secondary | ICD-10-CM | POA: Diagnosis not present

## 2017-09-02 DIAGNOSIS — I700 Atherosclerosis of aorta: Secondary | ICD-10-CM | POA: Insufficient documentation

## 2017-09-02 DIAGNOSIS — I745 Embolism and thrombosis of iliac artery: Secondary | ICD-10-CM | POA: Insufficient documentation

## 2017-09-02 DIAGNOSIS — K7460 Unspecified cirrhosis of liver: Secondary | ICD-10-CM | POA: Insufficient documentation

## 2017-09-02 DIAGNOSIS — I2510 Atherosclerotic heart disease of native coronary artery without angina pectoris: Secondary | ICD-10-CM | POA: Insufficient documentation

## 2017-09-02 DIAGNOSIS — K760 Fatty (change of) liver, not elsewhere classified: Secondary | ICD-10-CM | POA: Insufficient documentation

## 2017-09-02 DIAGNOSIS — K5730 Diverticulosis of large intestine without perforation or abscess without bleeding: Secondary | ICD-10-CM | POA: Insufficient documentation

## 2017-09-02 LAB — POCT I-STAT CREATININE: Creatinine, Ser: 1 mg/dL (ref 0.61–1.24)

## 2017-09-02 MED ORDER — IOPAMIDOL (ISOVUE-370) INJECTION 76%
100.0000 mL | Freq: Once | INTRAVENOUS | Status: AC | PRN
  Administered 2017-09-02: 100 mL via INTRAVENOUS

## 2017-09-11 ENCOUNTER — Encounter: Payer: Self-pay | Admitting: Vascular Surgery

## 2017-09-11 ENCOUNTER — Ambulatory Visit (INDEPENDENT_AMBULATORY_CARE_PROVIDER_SITE_OTHER): Payer: Medicare (Managed Care) | Admitting: Vascular Surgery

## 2017-09-11 VITALS — BP 145/90 | HR 65 | Temp 97.3°F | Resp 20 | Ht 72.0 in | Wt 268.0 lb

## 2017-09-11 DIAGNOSIS — I714 Abdominal aortic aneurysm, without rupture, unspecified: Secondary | ICD-10-CM

## 2017-09-11 NOTE — Addendum Note (Signed)
Addended by: Lianne Cure A on: 09/11/2017 04:57 PM   Modules accepted: Orders
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
## 2017-10-14 ENCOUNTER — Telehealth: Payer: Self-pay | Admitting: Internal Medicine

## 2017-10-14 ENCOUNTER — Encounter: Payer: Self-pay | Admitting: Internal Medicine

## 2017-10-14 ENCOUNTER — Ambulatory Visit: Payer: Medicare (Managed Care) | Admitting: Nurse Practitioner

## 2017-10-14 NOTE — Telephone Encounter (Signed)
Patient was a no show and letter sent  °

## 2017-10-19 NOTE — Telephone Encounter (Signed)
Noted  

## 2018-02-24 ENCOUNTER — Other Ambulatory Visit: Payer: Self-pay | Admitting: Internal Medicine

## 2018-03-03 ENCOUNTER — Telehealth (INDEPENDENT_AMBULATORY_CARE_PROVIDER_SITE_OTHER): Payer: Self-pay | Admitting: Orthopaedic Surgery

## 2018-03-03 MED ORDER — MELOXICAM 15 MG PO TABS: 15.0000 mg | ORAL_TABLET | Freq: Every day | ORAL | 0 refills | Status: DC

## 2018-03-03 NOTE — Addendum Note (Signed)
Addended by: Brand Males E on: 03/03/2018 03:13 PM   Modules accepted: Orders

## 2018-03-03 NOTE — Telephone Encounter (Signed)
Patient called asking for a refill on his Meloxicam. CB # [phone]

## 2018-03-03 NOTE — Telephone Encounter (Signed)
Ok to refill 

## 2018-03-03 NOTE — Telephone Encounter (Signed)
OK - thanks

## 2018-04-01 ENCOUNTER — Other Ambulatory Visit (INDEPENDENT_AMBULATORY_CARE_PROVIDER_SITE_OTHER): Payer: Self-pay | Admitting: Orthopaedic Surgery

## 2018-04-01 NOTE — Telephone Encounter (Signed)
Ok for refill? 

## 2018-05-03 ENCOUNTER — Other Ambulatory Visit (INDEPENDENT_AMBULATORY_CARE_PROVIDER_SITE_OTHER): Payer: Self-pay | Admitting: Orthopaedic Surgery

## 2018-05-03 NOTE — Telephone Encounter (Signed)
Please advise. OK for refill? 

## 2018-05-27 ENCOUNTER — Ambulatory Visit (INDEPENDENT_AMBULATORY_CARE_PROVIDER_SITE_OTHER): Payer: Medicare (Managed Care)

## 2018-05-27 ENCOUNTER — Encounter (INDEPENDENT_AMBULATORY_CARE_PROVIDER_SITE_OTHER): Payer: Self-pay | Admitting: Orthopaedic Surgery

## 2018-05-27 ENCOUNTER — Ambulatory Visit (INDEPENDENT_AMBULATORY_CARE_PROVIDER_SITE_OTHER): Payer: Medicare (Managed Care) | Admitting: Orthopaedic Surgery

## 2018-05-27 VITALS — BP 133/62 | HR 77 | Ht 72.0 in | Wt 270.0 lb

## 2018-05-27 DIAGNOSIS — M79641 Pain in right hand: Secondary | ICD-10-CM

## 2018-05-27 DIAGNOSIS — S60011A Contusion of right thumb without damage to nail, initial encounter: Secondary | ICD-10-CM | POA: Diagnosis not present
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
## 2018-05-30 ENCOUNTER — Encounter (INDEPENDENT_AMBULATORY_CARE_PROVIDER_SITE_OTHER): Payer: Self-pay | Admitting: Orthopaedic Surgery

## 2018-08-27 ENCOUNTER — Other Ambulatory Visit (INDEPENDENT_AMBULATORY_CARE_PROVIDER_SITE_OTHER): Payer: Self-pay | Admitting: Orthopaedic Surgery

## 2018-08-27 NOTE — Telephone Encounter (Signed)
Ok for refill? 

## 2018-09-28 ENCOUNTER — Other Ambulatory Visit (INDEPENDENT_AMBULATORY_CARE_PROVIDER_SITE_OTHER): Payer: Self-pay | Admitting: Orthopaedic Surgery

## 2018-09-28 NOTE — Telephone Encounter (Signed)
[place] for refill?  Any additional refills?

## 2018-10-23 ENCOUNTER — Other Ambulatory Visit (INDEPENDENT_AMBULATORY_CARE_PROVIDER_SITE_OTHER): Payer: Self-pay | Admitting: Orthopaedic Surgery

## 2018-10-25 NOTE — Telephone Encounter (Signed)
Ok to refill 

## 2018-10-25 NOTE — Telephone Encounter (Signed)
Patient aware.

## 2018-11-11 ENCOUNTER — Encounter (INDEPENDENT_AMBULATORY_CARE_PROVIDER_SITE_OTHER): Payer: Self-pay | Admitting: Orthopaedic Surgery

## 2018-11-11 ENCOUNTER — Ambulatory Visit (INDEPENDENT_AMBULATORY_CARE_PROVIDER_SITE_OTHER): Payer: Medicare (Managed Care)

## 2018-11-11 ENCOUNTER — Ambulatory Visit (INDEPENDENT_AMBULATORY_CARE_PROVIDER_SITE_OTHER): Payer: Medicare (Managed Care) | Admitting: Orthopaedic Surgery

## 2018-11-11 VITALS — BP 148/89 | HR 76 | Ht 72.0 in | Wt 257.0 lb

## 2018-11-11 DIAGNOSIS — G8929 Other chronic pain: Secondary | ICD-10-CM | POA: Diagnosis not present

## 2018-11-11 DIAGNOSIS — M545 Low back pain, unspecified: Secondary | ICD-10-CM

## 2018-11-11 DIAGNOSIS — M25511 Pain in right shoulder: Secondary | ICD-10-CM

## 2018-11-11 DIAGNOSIS — M7541 Impingement syndrome of right shoulder: Secondary | ICD-10-CM

## 2018-11-11 NOTE — Addendum Note (Signed)
Addended by: Meyer Cory on: 11/11/2018 10:20 AM   Modules accepted: Orders
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
## 2018-11-18 ENCOUNTER — Ambulatory Visit: Payer: Medicare Other | Attending: Orthopaedic Surgery | Admitting: Physical Therapy

## 2018-11-18 ENCOUNTER — Encounter: Payer: Self-pay | Admitting: Physical Therapy

## 2018-11-18 ENCOUNTER — Other Ambulatory Visit: Payer: Self-pay

## 2018-11-18 DIAGNOSIS — M545 Low back pain, unspecified: Secondary | ICD-10-CM

## 2018-11-18 DIAGNOSIS — M25611 Stiffness of right shoulder, not elsewhere classified: Secondary | ICD-10-CM | POA: Insufficient documentation

## 2018-11-18 DIAGNOSIS — M6281 Muscle weakness (generalized): Secondary | ICD-10-CM | POA: Insufficient documentation

## 2018-11-18 DIAGNOSIS — M25511 Pain in right shoulder: Secondary | ICD-10-CM | POA: Diagnosis present
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
## 2018-11-19 ENCOUNTER — Ambulatory Visit: Payer: Medicare Other | Admitting: *Deleted

## 2018-11-19 ENCOUNTER — Other Ambulatory Visit: Payer: Self-pay | Admitting: Gastroenterology

## 2018-11-19 DIAGNOSIS — M545 Low back pain, unspecified: Secondary | ICD-10-CM

## 2018-11-19 DIAGNOSIS — M6281 Muscle weakness (generalized): Secondary | ICD-10-CM

## 2018-11-19 DIAGNOSIS — M25511 Pain in right shoulder: Secondary | ICD-10-CM

## 2018-11-19 DIAGNOSIS — M25611 Stiffness of right shoulder, not elsewhere classified: Secondary | ICD-10-CM
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
## 2018-11-23 ENCOUNTER — Ambulatory Visit: Payer: Medicare Other | Admitting: *Deleted

## 2018-11-23 DIAGNOSIS — M545 Low back pain, unspecified: Secondary | ICD-10-CM

## 2018-11-23 DIAGNOSIS — M25611 Stiffness of right shoulder, not elsewhere classified: Secondary | ICD-10-CM

## 2018-11-23 DIAGNOSIS — M6281 Muscle weakness (generalized): Secondary | ICD-10-CM

## 2018-11-23 DIAGNOSIS — M25511 Pain in right shoulder: Secondary | ICD-10-CM
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
## 2018-11-25 ENCOUNTER — Ambulatory Visit: Payer: Medicare Other | Admitting: *Deleted

## 2018-11-25 DIAGNOSIS — M545 Low back pain, unspecified: Secondary | ICD-10-CM

## 2018-11-25 DIAGNOSIS — M6281 Muscle weakness (generalized): Secondary | ICD-10-CM

## 2018-11-25 DIAGNOSIS — M25511 Pain in right shoulder: Secondary | ICD-10-CM

## 2018-11-25 DIAGNOSIS — M25611 Stiffness of right shoulder, not elsewhere classified: Secondary | ICD-10-CM
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
## 2018-11-26 ENCOUNTER — Other Ambulatory Visit (INDEPENDENT_AMBULATORY_CARE_PROVIDER_SITE_OTHER): Payer: Self-pay | Admitting: Orthopaedic Surgery

## 2018-11-26 NOTE — Telephone Encounter (Signed)
Ok for refill? 

## 2018-11-30 ENCOUNTER — Ambulatory Visit: Payer: Medicare Other | Admitting: *Deleted

## 2018-11-30 DIAGNOSIS — M6281 Muscle weakness (generalized): Secondary | ICD-10-CM

## 2018-11-30 DIAGNOSIS — M545 Low back pain, unspecified: Secondary | ICD-10-CM

## 2018-11-30 DIAGNOSIS — M25611 Stiffness of right shoulder, not elsewhere classified: Secondary | ICD-10-CM

## 2018-11-30 DIAGNOSIS — M25511 Pain in right shoulder: Secondary | ICD-10-CM
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
## 2018-12-02 ENCOUNTER — Ambulatory Visit: Payer: Medicare Other | Admitting: *Deleted

## 2018-12-02 DIAGNOSIS — M25611 Stiffness of right shoulder, not elsewhere classified: Secondary | ICD-10-CM

## 2018-12-02 DIAGNOSIS — M545 Low back pain, unspecified: Secondary | ICD-10-CM

## 2018-12-02 DIAGNOSIS — M6281 Muscle weakness (generalized): Secondary | ICD-10-CM

## 2018-12-02 DIAGNOSIS — M25511 Pain in right shoulder: Secondary | ICD-10-CM
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
## 2018-12-07 ENCOUNTER — Ambulatory Visit: Payer: Medicare Other | Admitting: *Deleted

## 2018-12-07 DIAGNOSIS — M25611 Stiffness of right shoulder, not elsewhere classified: Secondary | ICD-10-CM

## 2018-12-07 DIAGNOSIS — M545 Low back pain, unspecified: Secondary | ICD-10-CM

## 2018-12-07 DIAGNOSIS — M25511 Pain in right shoulder: Secondary | ICD-10-CM

## 2018-12-07 DIAGNOSIS — M6281 Muscle weakness (generalized): Secondary | ICD-10-CM
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
## 2018-12-09 ENCOUNTER — Ambulatory Visit: Payer: Medicare Other | Admitting: *Deleted

## 2018-12-09 DIAGNOSIS — M545 Low back pain, unspecified: Secondary | ICD-10-CM

## 2018-12-09 DIAGNOSIS — M25611 Stiffness of right shoulder, not elsewhere classified: Secondary | ICD-10-CM

## 2018-12-09 DIAGNOSIS — M25511 Pain in right shoulder: Secondary | ICD-10-CM

## 2018-12-09 DIAGNOSIS — M6281 Muscle weakness (generalized): Secondary | ICD-10-CM
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
## 2018-12-14 ENCOUNTER — Ambulatory Visit: Payer: Medicare Other | Attending: Orthopaedic Surgery | Admitting: *Deleted

## 2018-12-14 DIAGNOSIS — M6281 Muscle weakness (generalized): Secondary | ICD-10-CM | POA: Insufficient documentation

## 2018-12-14 DIAGNOSIS — M545 Low back pain, unspecified: Secondary | ICD-10-CM

## 2018-12-14 DIAGNOSIS — M25611 Stiffness of right shoulder, not elsewhere classified: Secondary | ICD-10-CM | POA: Insufficient documentation

## 2018-12-14 DIAGNOSIS — M25511 Pain in right shoulder: Secondary | ICD-10-CM | POA: Diagnosis present
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
## 2018-12-16 ENCOUNTER — Ambulatory Visit (INDEPENDENT_AMBULATORY_CARE_PROVIDER_SITE_OTHER): Payer: Medicare (Managed Care) | Admitting: Orthopaedic Surgery

## 2018-12-16 ENCOUNTER — Ambulatory Visit: Payer: Medicare Other | Admitting: *Deleted

## 2018-12-16 ENCOUNTER — Encounter (INDEPENDENT_AMBULATORY_CARE_PROVIDER_SITE_OTHER): Payer: Self-pay | Admitting: Orthopaedic Surgery

## 2018-12-16 VITALS — BP 153/92 | HR 74 | Ht 68.0 in | Wt 260.0 lb

## 2018-12-16 DIAGNOSIS — M6281 Muscle weakness (generalized): Secondary | ICD-10-CM

## 2018-12-16 DIAGNOSIS — M545 Low back pain, unspecified: Secondary | ICD-10-CM

## 2018-12-16 DIAGNOSIS — M25511 Pain in right shoulder: Secondary | ICD-10-CM

## 2018-12-16 DIAGNOSIS — M25611 Stiffness of right shoulder, not elsewhere classified: Secondary | ICD-10-CM
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
## 2018-12-29 ENCOUNTER — Other Ambulatory Visit (INDEPENDENT_AMBULATORY_CARE_PROVIDER_SITE_OTHER): Payer: Self-pay | Admitting: Orthopaedic Surgery

## 2018-12-29 NOTE — Telephone Encounter (Signed)
Ok for refill? 

## 2019-01-28 ENCOUNTER — Other Ambulatory Visit (INDEPENDENT_AMBULATORY_CARE_PROVIDER_SITE_OTHER): Payer: Self-pay | Admitting: Orthopaedic Surgery

## 2019-01-28 NOTE — Telephone Encounter (Signed)
Ok for refill? 

## 2019-07-30 DIAGNOSIS — Z794 Long term (current) use of insulin: Secondary | ICD-10-CM | POA: Insufficient documentation

## 2019-07-30 DIAGNOSIS — E1169 Type 2 diabetes mellitus with other specified complication: Secondary | ICD-10-CM | POA: Insufficient documentation

## 2019-07-30 DIAGNOSIS — E119 Type 2 diabetes mellitus without complications: Secondary | ICD-10-CM | POA: Insufficient documentation

## 2019-08-18 ENCOUNTER — Other Ambulatory Visit: Payer: Self-pay | Admitting: Gastroenterology

## 2019-08-19 NOTE — Telephone Encounter (Signed)
Please tell the patient he hasn't been seen in over 2 years. I can send in a limited refill but will need a follow-up appointment for further refills vs. Discussing with PCP.

## 2019-08-22 NOTE — Telephone Encounter (Signed)
Spoke with pt. He will call back to schedule. Pt's spouse is getting ready for a procedure and pt wont be able to schedule until after her procedure.

## 2019-09-05 NOTE — Telephone Encounter (Signed)
Rx sent for 3 months

## 2019-11-17 ENCOUNTER — Other Ambulatory Visit: Payer: Self-pay | Admitting: Nurse Practitioner

## 2019-11-18 ENCOUNTER — Telehealth: Payer: Self-pay | Admitting: Internal Medicine

## 2019-11-18 NOTE — Telephone Encounter (Signed)
[phone] patient called and stated that someone called him from this office     Please call him back

## 2019-11-18 NOTE — Telephone Encounter (Signed)
Noted. Lmom, waiting on a return call to discuss with pt.

## 2019-11-18 NOTE — Telephone Encounter (Signed)
Spoke with pt. Pt was advised to schedule an apt to receive additional refills per [facility]. Pt will call back when ready to schedule per pt.

## 2019-11-18 NOTE — Telephone Encounter (Signed)
Sending in 2 month supply. Patient hasn't been seen since 2018. He will need office visit for further refills.

## 2019-12-20 ENCOUNTER — Encounter: Payer: Self-pay | Admitting: Internal Medicine

## 2019-12-20 NOTE — Telephone Encounter (Signed)
Please schedule pt for an ov to further refills

## 2019-12-20 NOTE — Telephone Encounter (Signed)
SCHEDULED PATIENT AND SENT LETTER  °

## 2020-02-01 ENCOUNTER — Ambulatory Visit: Payer: Medicare Other | Admitting: Gastroenterology

## 2020-02-07 ENCOUNTER — Other Ambulatory Visit (INDEPENDENT_AMBULATORY_CARE_PROVIDER_SITE_OTHER): Payer: Self-pay | Admitting: Orthopaedic Surgery

## 2020-02-07 NOTE — Telephone Encounter (Signed)
Please advise 

## 2020-04-06 ENCOUNTER — Other Ambulatory Visit (INDEPENDENT_AMBULATORY_CARE_PROVIDER_SITE_OTHER): Payer: Self-pay | Admitting: Orthopaedic Surgery

## 2020-05-12 ENCOUNTER — Other Ambulatory Visit (INDEPENDENT_AMBULATORY_CARE_PROVIDER_SITE_OTHER): Payer: Self-pay | Admitting: Orthopaedic Surgery

## 2020-05-15 NOTE — Telephone Encounter (Signed)
Please advise 

## 2020-06-11 ENCOUNTER — Other Ambulatory Visit (INDEPENDENT_AMBULATORY_CARE_PROVIDER_SITE_OTHER): Payer: Self-pay | Admitting: Orthopaedic Surgery

## 2020-06-11 NOTE — Telephone Encounter (Signed)
Pls advise.  

## 2020-07-11 ENCOUNTER — Other Ambulatory Visit (INDEPENDENT_AMBULATORY_CARE_PROVIDER_SITE_OTHER): Payer: Self-pay | Admitting: Orthopaedic Surgery

## 2020-07-11 NOTE — Telephone Encounter (Signed)
Please advise. Any additional refills? 

## 2020-07-11 NOTE — Telephone Encounter (Signed)
Sent to pharmacy 

## 2020-07-11 NOTE — Telephone Encounter (Signed)
Yes OK refill times 3 thanks
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
## 2020-07-19 ENCOUNTER — Other Ambulatory Visit: Payer: Self-pay | Admitting: *Deleted

## 2020-07-19 ENCOUNTER — Other Ambulatory Visit (HOSPITAL_COMMUNITY)
Admission: RE | Admit: 2020-07-19 | Discharge: 2020-07-19 | Disposition: A | Discharge status: Home / Self Care | Payer: Medicare Other | Source: Ambulatory Visit | Attending: Gastroenterology | Admitting: Gastroenterology

## 2020-07-19 ENCOUNTER — Telehealth: Payer: Self-pay | Admitting: Internal Medicine

## 2020-07-19 ENCOUNTER — Encounter: Payer: Self-pay | Admitting: Gastroenterology

## 2020-07-19 ENCOUNTER — Ambulatory Visit (INDEPENDENT_AMBULATORY_CARE_PROVIDER_SITE_OTHER): Payer: No Typology Code available for payment source | Admitting: Gastroenterology

## 2020-07-19 ENCOUNTER — Other Ambulatory Visit: Payer: Self-pay

## 2020-07-19 ENCOUNTER — Telehealth: Payer: Self-pay | Admitting: *Deleted

## 2020-07-19 VITALS — BP 156/78 | HR 78 | Temp 97.9°F | Ht 70.0 in | Wt 259.4 lb

## 2020-07-19 DIAGNOSIS — K5900 Constipation, unspecified: Secondary | ICD-10-CM | POA: Diagnosis not present

## 2020-07-19 DIAGNOSIS — D649 Anemia, unspecified: Secondary | ICD-10-CM

## 2020-07-19 DIAGNOSIS — K219 Gastro-esophageal reflux disease without esophagitis: Secondary | ICD-10-CM | POA: Diagnosis not present

## 2020-07-19 DIAGNOSIS — R1319 Other dysphagia: Secondary | ICD-10-CM

## 2020-07-19 DIAGNOSIS — D539 Nutritional anemia, unspecified: Secondary | ICD-10-CM | POA: Insufficient documentation

## 2020-07-19 DIAGNOSIS — K7460 Unspecified cirrhosis of liver: Secondary | ICD-10-CM | POA: Insufficient documentation

## 2020-07-19 DIAGNOSIS — Z86010 Personal history of colonic polyps: Secondary | ICD-10-CM

## 2020-07-19 LAB — CBC WITH DIFFERENTIAL/PLATELET
Abs Immature Granulocytes: 0.27 10*3/uL — ABNORMAL HIGH (ref 0.00–0.07)
Basophils Absolute: 0.1 10*3/uL (ref 0.0–0.1)
Basophils Relative: 1 %
Eosinophils Absolute: 0.2 10*3/uL (ref 0.0–0.5)
Eosinophils Relative: 2 %
HCT: 32.5 % — ABNORMAL LOW (ref 39.0–52.0)
Hemoglobin: 10.1 g/dL — ABNORMAL LOW (ref 13.0–17.0)
Immature Granulocytes: 3 %
Lymphocytes Relative: 17 %
Lymphs Abs: 1.7 10*3/uL (ref 0.7–4.0)
MCH: 32.8 pg (ref 26.0–34.0)
MCHC: 31.1 g/dL (ref 30.0–36.0)
MCV: 105.5 fL — ABNORMAL HIGH (ref 80.0–100.0)
Monocytes Absolute: 1 10*3/uL (ref 0.1–1.0)
Monocytes Relative: 10 %
Neutro Abs: 6.6 10*3/uL (ref 1.7–7.7)
Neutrophils Relative %: 67 %
Platelets: 170 10*3/uL (ref 150–400)
RBC: 3.08 MIL/uL — ABNORMAL LOW (ref 4.22–5.81)
RDW: 15.3 % (ref 11.5–15.5)
WBC: 9.7 10*3/uL (ref 4.0–10.5)
nRBC: 0 % (ref 0.0–0.2)

## 2020-07-19 LAB — FERRITIN: Ferritin: 59 ng/mL (ref 24–336)

## 2020-07-19 LAB — COMPREHENSIVE METABOLIC PANEL
ALT: 16 U/L (ref 0–44)
AST: 17 U/L (ref 15–41)
Albumin: 4.4 g/dL (ref 3.5–5.0)
Alkaline Phosphatase: 37 U/L — ABNORMAL LOW (ref 38–126)
Anion gap: 8 (ref 5–15)
BUN: 29 mg/dL — ABNORMAL HIGH (ref 8–23)
CO2: 25 mmol/L (ref 22–32)
Calcium: 9 mg/dL (ref 8.9–10.3)
Chloride: 105 mmol/L (ref 98–111)
Creatinine, Ser: 1.11 mg/dL (ref 0.61–1.24)
GFR calc Af Amer: >60 mL/min (ref >60)
GFR calc non Af Amer: >60 mL/min (ref >60)
Glucose, Bld: 93 mg/dL (ref 70–99)
Potassium: 4.8 mmol/L (ref 3.5–5.1)
Sodium: 138 mmol/L (ref 135–145)
Total Bilirubin: 0.8 mg/dL (ref 0.3–1.2)
Total Protein: 7.7 g/dL (ref 6.5–8.1)

## 2020-07-19 LAB — IRON AND TIBC
Iron: 105 ug/dL (ref 45–182)
Saturation Ratios: 20 % (ref 17.9–39.5)
TIBC: 515 ug/dL — ABNORMAL HIGH (ref 250–450)
UIBC: 410 ug/dL

## 2020-07-19 LAB — HEPATITIS A ANTIBODY, TOTAL: hep A Total Ab: NONREACTIVE

## 2020-07-19 LAB — HEPATITIS C ANTIBODY: HCV Ab: NONREACTIVE

## 2020-07-19 LAB — PROTIME-INR
INR: 1 (ref 0.8–1.2)
Prothrombin Time: 12.6 seconds (ref 11.4–15.2)

## 2020-07-19 LAB — HEPATITIS B SURFACE ANTIBODY,QUALITATIVE: Hep B S Ab: NONREACTIVE

## 2020-07-19 LAB — AMMONIA: Ammonia: 23 umol/L (ref 9–35)

## 2020-07-19 LAB — HEPATITIS B SURFACE ANTIGEN: Hepatitis B Surface Ag: NONREACTIVE

## 2020-07-19 MED ORDER — PEG 3350-KCL-NA BICARB-NACL 420 G PO SOLR: ORAL | 0 refills | Status: DC

## 2020-07-19 MED ORDER — LINACLOTIDE 145 MCG PO CAPS: 145.0000 ug | ORAL_CAPSULE | Freq: Every day | ORAL | 5 refills | Status: DC

## 2020-07-19 NOTE — Telephone Encounter (Signed)
Patient returned call. He asked I speak with his spouse. She is aware of appt details for [place]. Also have scheduled procedure for 11/29 at 8:!5am. Aware will need pre-op/covid test appt prior. Advised will mail with prep instructions. Confirmed mailing address is correct.

## 2020-07-19 NOTE — Assessment & Plan Note (Addendum)
79 year old male with history of adenomatous colon polyps.  Last colonoscopy in March 2018 with three 4-7 mm polyps.  Pathology revealed 2 tubular adenomas.  Recommended repeat colonoscopy in 3 years if health permits.  Patient desires to have repeat colonoscopy at this time.  Current lower GI symptoms include constipation which is chronic and not adequately controlled, intermittent lower abdominal pain/cramping which I suspect is secondary to constipation with a benign abdominal exam today.  Denies BRBPR or melena.  Weight is fairly stable.  Per review of labs in care everywhere, patient's hemoglobin has been persistently in the low 12 range with normocytic indices since January 2019 with last labs completed in May 2020.  Kidney function was within normal limits.  Plan: Proceed with colonoscopy with propofol with Dr. Gala Romney in the near future. The risks, benefits, and alternatives have been discussed with the patient in detail. The patient states understanding and desires to proceed.  ASA III See separate instructions for diabetes medication adjustments for procedure. Trial Linzess 145 mcg daily 30 minutes before breakfast. Advised patient to call if Linzess does not improve constipation or if abdominal pain does not improve as constipation improves. Plan follow-up after procedure.

## 2020-07-19 NOTE — Assessment & Plan Note (Addendum)
Per review of labs in care everywhere, patient had hemoglobin persistently in the low 12 range with normocytic indices since January 2019 with last labs completed May 2020.  Kidney function within normal limits.  He denies overt GI bleeding.  Note stools will be dark if he takes Pepto.  Takes 1 Advil daily.  Current GI symptoms include dysphagia, chronic constipation, intermittent lower abdominal cramping which I suspect is secondary to constipation, occasional nausea with vomiting secondary to cramping. Last EGD in 2018 with LA grade B esophagitis s/p dilation, gastric mucosal changes consistent with portal gastropathy.  He is maintained on Protonix 40 mg daily which keeps GERD well controlled.  Colonoscopy also in March 2018 with three 4-7 mm polyps and diverticulosis.  Pathology with 2 tubular adenomas with recommendations to repeat in 3 years if health permits.  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   Plan:  Update CBC and iron panel Proceed with EGD +/-dilation and colonoscopy due to dysphagia, cirrhosis, and history of colon polyps.  This will also help evaluate for GI etiology of anemia. Follow-up after procedures.

## 2020-07-19 NOTE — Telephone Encounter (Signed)
Returning call. [phone]

## 2020-07-19 NOTE — Assessment & Plan Note (Signed)
GERD symptoms are well controlled on Protonix 40 mg daily.  He does report recurrent dysphagia as per below with plans for EGD.  Plan: Continue Protonix 40 mg daily 30 minutes before breakfast. Proceed with EGD +/-dilation with propofol with Dr. Gala Romney in the near future. ASA III Follow-up after procedure.

## 2020-07-19 NOTE — Telephone Encounter (Signed)
Se prior note

## 2020-07-19 NOTE — Telephone Encounter (Signed)
Called pt and LMOVM to call back. He needs to be scheduled for TCS/EGD/ +/-DIL with propofol, Dr. Gala Romney, ASA 3 [place] scheduled for 9/14 at 8:30am, arrival 8:15am, npo midnight

## 2020-07-19 NOTE — Assessment & Plan Note (Addendum)
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  Spleen appeared normal.  No recent imaging on file.  Notably, EGD in 2018 with portal gastropathy, no varices.  Reviewed labs in Care Everywhere with hemoglobin persistently in the low 12 range with normocytic indices since January 2019 with last labs completed May 2020.  Platelets within normal limits.  LFTs within normal limits.  No signs or symptoms of decompensated liver disease.  His wife does note intermittent confusion with forgetting where he puts things.  Patient states he does not remember as well as he used to.  He is alert and oriented x4 today.  Doubt HE.   Regarding etiology of cirrhosis, suspect this is likely multifactorial in the setting of fatty liver and history of chronic alcohol use. Per patient, he has not drank any alcohol in the last year.  No regular Tylenol use.  No history of drug use.  Plan: CBC, iron panel, CMP, INR, AFP, ammonia, hepatitis A antibody total, hepatitis B surface antigen, hepatitis B surface antibody, hepatitis B core antibody, hepatitis C antibody. Abdominal ultrasound. Plans for EGD +/-dilation with propofol with Dr. Gala Romney in the near future due to dysphagia and variceal screening. Continue to avoid alcohol. No more than 2000 mg of Tylenol per 24 hours. No more than 2000 mg of sodium per 24 hours. Counseled on signs/symptoms of decompensated liver disease and advised to call us with any concerns. Follow-up after procedure.

## 2020-07-19 NOTE — Addendum Note (Signed)
Addended by: Cheron Every on: 07/19/2020 04:22 PM   Modules accepted: Orders

## 2020-07-19 NOTE — Assessment & Plan Note (Addendum)
Chronic history of constipation.  Currently taking a colon cleanse nightly which allows him to have a bowel movement daily, but he does not feel these are productive.  Intermittent lower abdominal cramping with sensation of needing to have a BM.  Cramping will improve if he has a bowel movement.  If he does not have a BM, cramping may end up inducing nausea with an episode of vomiting.  Historically, patient was tried on Linzess 145 mcg.  Per chart review, this caused cramping.  Discussed this with patient today.  He states Linzess did work very well for him.  He does not remember much about the cramping.  We discussed other medication options including Amitiza and Trulance.  Patient prefers to retry Linzess first.  Notably, abdominal exam is benign today.  Plan: Linzess 145 mcg daily 30 minutes before breakfast. Requested patient call if Linzess is not improved constipation or if his abdominal pain does not improve with improvement of constipation. Of note, he is due for colonoscopy as per below. Plan to follow-up after procedure.

## 2020-07-19 NOTE — Assessment & Plan Note (Signed)
Patient has history of dysphagia with empiric dilation in 2011, dilation of Schatzki's ring in 2013, and EGD in 2018 with LA grade B esophagitis with dilation.  Patient reports improvement in symptoms after dilations but notes return of dysphagia about 5-6 months ago with symptoms occurring daily.  GERD symptoms are well controlled on Protonix 40 mg daily.  Plan: Proceed with EGD +/-dilation with propofol with Dr. Gala Romney in the near future. Tthe risks, benefits, and alternatives have been discussed with the patient in detail. The patient states understanding and desires to proceed.  ASA III Continue Protonix 40 mg daily. Follow-up after procedure.

## 2020-07-19 NOTE — Patient Instructions (Addendum)
Please have labs completed at [facility] lab.  We will arrange for you to have an ultrasound of your abdomen.  We will arrange for you to have an upper endoscopy with possible dilation of your esophagus and colonoscopy in the near future with Dr. Gala Romney. 1 day prior to your procedure: Take one half dose of glipizide (5 mg), one half dose Januvia (50 mg), one half dose Tresiba (40 units).   Day of procedure: Do not take any morning diabetes medications.  I am sending in Linzess 145 mcg to your pharmacy.  Take this medication once daily 30 minutes before breakfast. Please call if this does not improve your constipation.  Please call if your abdominal pain does not improve as constipation improves.  Continue Protonix 40 mg daily 30 minutes before breakfast.  For cirrhosis: We are updating labs and ultrasound. Follow a low-salt diet.  You should not consume more than 2000 mg of sodium in 24 hours.  This includes all foods and liquids. Avoid alcohol. No more than 2000 mg of Tylenol in 24 hours. Monitor for swelling in your lower extremities or abdomen, acute changes in mental status, yellowing of the eyes or skin, or bruising/bleeding and let me know if this occurs.  We will plan to see you back in the office after your procedures.  Do not hesitate to call with questions or concerns prior.  Aliene Altes, PA-C [facility]

## 2020-07-20 ENCOUNTER — Other Ambulatory Visit: Payer: Self-pay

## 2020-07-20 DIAGNOSIS — Z79899 Other long term (current) drug therapy: Secondary | ICD-10-CM

## 2020-07-20 DIAGNOSIS — K7460 Unspecified cirrhosis of liver: Secondary | ICD-10-CM

## 2020-07-20 LAB — HEPATITIS B CORE ANTIBODY, TOTAL: Hep B Core Total Ab: NONREACTIVE

## 2020-07-20 LAB — HEPATITIS B CORE ANTIBODY, IGM: Hep B C IgM: REACTIVE — AB

## 2020-07-20 LAB — AFP TUMOR MARKER: AFP, Serum, Tumor Marker: 1.4 ng/mL (ref 0.0–8.3)

## 2020-07-23 ENCOUNTER — Encounter: Payer: Self-pay | Admitting: *Deleted

## 2020-07-24 ENCOUNTER — Telehealth: Payer: Self-pay

## 2020-07-24 ENCOUNTER — Other Ambulatory Visit: Payer: Self-pay

## 2020-07-24 ENCOUNTER — Ambulatory Visit (HOSPITAL_COMMUNITY)
Admission: RE | Admit: 2020-07-24 | Discharge: 2020-07-24 | Disposition: A | Discharge status: Home / Self Care | Payer: Medicare Other | Source: Ambulatory Visit | Attending: Gastroenterology | Admitting: Gastroenterology

## 2020-07-24 DIAGNOSIS — K7460 Unspecified cirrhosis of liver: Secondary | ICD-10-CM | POA: Diagnosis not present

## 2020-07-24 NOTE — Telephone Encounter (Signed)
Noted. Will try to address results today.

## 2020-07-24 NOTE — Telephone Encounter (Signed)
Noted  

## 2020-07-24 NOTE — Telephone Encounter (Signed)
Pt walked in office today to get his lab results. Pt's phone isn't working correctly and is aware that I'll mail him the results when resulted. Pt is aware that another lab test had to be run last week and the results will be mailed to pt with any recommendations.

## 2020-07-25 NOTE — Progress Notes (Signed)
US shows he at least has moderate hepatic steatosis; however, I suspect this is early cirrhosis as he also has splenomegaly and evidence of portal gastropathy on prior EGD. No focal liver lesions.   Alicia: Please let patient know his [place] is stable. Findings are not entirely consistent with cirrhosis. More consistent with advanced fatty liver; however, I suspect he likely has early cirrhosis as prior CT imaging has suggested this and his spleen is also enlarged. No focal lesions within the liver. We will continue to monitor.   Recommendations:  Instructions for fatty liver: Recommend 1-2# weight loss per week until ideal body weight through exercise & diet. Low fat/cholesterol diet.  Avoid sweets, sodas, fruit juices, sweetened beverages like tea, etc. Gradually increase exercise from 15 min daily up to 1 hr per day 5 days/week. Limit alcohol use.

## 2020-07-25 NOTE — Progress Notes (Signed)
Hemoglobin is low at 10.1, platelets within normal limits, electrolytes, kidney function, and liver function test within normal limits. Iron panel within normal limits. Ammonia within normal limits (we checked this to see if it was contributing to forgetfulness). AFP (tumor marker) normal. INR within normal limits.   MELD 7 indication good liver function.   No Hepatitis A. No immunity to Hep A. No Hepatitis C. His Hepatitis B labs have resulted a little abnormal, but I suspect this may have been a collection issue. Overall, labs suggest he doesn't have Hep B nor does he have immunity to Hep B. I will verify this with the liver clinic in [place] and let him know if we need to do anything else.   Recommendations: 1. Check folate and B12 to further evaluate decline in hemoglobin.  2. Avoid all NSAIDs including ibuprofen, Aleve, Advil, goody powders, naproxen, and anything that says "NSAID". 3. Monitor for bright red blood per rectum or black stools and let us know if this occurs.  4. He will need vaccination to Hep A.  5. Likely needs vaccination to Hep B. Hold off on this until I verify results with hepatology.  6. Proceed with EGD + TCS as scheduled.  7. Follow-up with PCP on forgetfulness.

## 2020-07-26 ENCOUNTER — Other Ambulatory Visit: Payer: Self-pay

## 2020-07-26 DIAGNOSIS — Z1159 Encounter for screening for other viral diseases: Secondary | ICD-10-CM

## 2020-07-26 DIAGNOSIS — Z79899 Other long term (current) drug therapy: Secondary | ICD-10-CM

## 2020-07-26 NOTE — Progress Notes (Signed)
Spoke with Roosevelt Locks, NP with the liver clinic. We need to rule out acute HBV. Other possibilities are resolving acute infection or false positive.   She has recommended checking HBV DNA. Please arrange.

## 2020-07-28 ENCOUNTER — Other Ambulatory Visit: Payer: Self-pay | Admitting: Gastroenterology

## 2020-07-30 ENCOUNTER — Telehealth: Payer: Self-pay | Admitting: Internal Medicine

## 2020-07-30 NOTE — Telephone Encounter (Signed)
Spoke with wife and she informed me that they hadn't received lab orders through mail.  Called PCP and confirmed that they could draw labs there.  Faxed lab work order accordingly to [phone].  Pt is aware that order was faxed.

## 2020-07-30 NOTE — Telephone Encounter (Signed)
Pt's wife called saying that Elmo Putt was going to mail her something regarding patient's labs and he was going to see his PCP this Wednesday. She has questions about that. Please call her at [phone]

## 2020-08-09 ENCOUNTER — Telehealth: Payer: Self-pay | Admitting: Internal Medicine

## 2020-08-09 ENCOUNTER — Other Ambulatory Visit: Payer: Self-pay | Admitting: Gastroenterology

## 2020-08-09 DIAGNOSIS — K5900 Constipation, unspecified: Secondary | ICD-10-CM

## 2020-08-09 MED ORDER — LUBIPROSTONE 24 MCG PO CAPS: 24.0000 ug | ORAL_CAPSULE | Freq: Two times a day (BID) | ORAL | 3 refills | Status: DC

## 2020-08-09 NOTE — Telephone Encounter (Signed)
Spoke with patient. He is taking Linzess 145 mcg which is producing BMs in the morning. With this, he is having no abdominal pain or nausea in the morning. After eating dinner, he develops cramping in the lower abdomen and feels he needs to have a BM but doesn't. This cramping causes nausea. He has not had any vomiting since our office visit on 9/9.   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 Doubt gallbladder etiology as gallbladder appeared normal on recent US and pain is in the lower abdomen. We will try Amitiza 24 mcg BID rather than Linzess to see if provides any better management of constipation and abdominal cramping. I have sent Rx to the pharmacy.   GERD is well controlled on Protonix daily.   Candace Cruise

## 2020-08-09 NOTE — Telephone Encounter (Signed)
Pt had an U/S done on 07/24/2020 and hasn't heard about his results yet. Please call [phone]
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
## 2020-08-09 NOTE — Telephone Encounter (Signed)
Noted  

## 2020-08-10 ENCOUNTER — Telehealth: Payer: Self-pay | Admitting: Gastroenterology

## 2020-08-10 NOTE — Telephone Encounter (Signed)
Additional labs I had ordered were completed with his PCP. Labs were reviewed in [place].   B12 540 and Folate 9.7 both of which are within normal limits. Nothing thus far to explain his decline in hemoglobin as his iron panel was also within normal limits. We will proceed with TCS + EGD as planned. He should discuss this further with his PCP as well.   HBV DNA was not detected. This means that the prior test we completed was likely a false positive and he does not have Hep B. He will need vaccination to both Hep A and Hep B.

## 2020-08-13 NOTE — Telephone Encounter (Signed)
Noted, will mail orders for vaccinations to pt.

## 2020-08-13 NOTE — Telephone Encounter (Signed)
Lmom, waiting on a return call.  

## 2020-09-20 ENCOUNTER — Other Ambulatory Visit: Payer: Self-pay

## 2020-09-20 ENCOUNTER — Ambulatory Visit (INDEPENDENT_AMBULATORY_CARE_PROVIDER_SITE_OTHER): Payer: No Typology Code available for payment source

## 2020-09-20 ENCOUNTER — Encounter: Payer: Self-pay | Admitting: Orthopaedic Surgery

## 2020-09-20 ENCOUNTER — Ambulatory Visit (INDEPENDENT_AMBULATORY_CARE_PROVIDER_SITE_OTHER): Payer: No Typology Code available for payment source | Admitting: Orthopaedic Surgery

## 2020-09-20 VITALS — Ht 71.0 in | Wt 250.0 lb

## 2020-09-20 DIAGNOSIS — M545 Low back pain, unspecified: Secondary | ICD-10-CM

## 2020-09-20 DIAGNOSIS — G8929 Other chronic pain: Secondary | ICD-10-CM | POA: Diagnosis not present
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
## 2020-09-26 ENCOUNTER — Telehealth: Payer: Self-pay | Admitting: Internal Medicine

## 2020-09-26 NOTE — Telephone Encounter (Signed)
Called pt, no answer. No VM. Will call pt back.

## 2020-09-26 NOTE — Telephone Encounter (Signed)
Pt's wife called and needs to speak with the nurse. [phone]

## 2020-09-27 NOTE — Telephone Encounter (Signed)
Lmom, waiting on a return call.  

## 2020-09-28 NOTE — Telephone Encounter (Signed)
Spouse called back. Pt received hep A & B vaccine order and wanted confirm that pt needed this done. Pt is aware that this is needed.

## 2020-10-02 NOTE — Patient Instructions (Signed)
David Duarte  10/02/2020     @PREFPERIOPPHARMACY @   Your procedure is scheduled on  10/08/2020.  Report to [facility] at  [phone]  A.M.  Call this number if you have problems the morning of surgery:  [phone]   Remember:  Follow the diet and prep instructions given to you by the office.                      Take these medicines the morning of surgery with A SIP OF WATER  Aricept, lexapro, mobic(if needed), protonix.    Do not wear jewelry, make-up or nail polish.  Do not wear lotions, powders, or perfumes. Please wear deodorant and brush your teeth.  Do not shave 48 hours prior to surgery.  Men may shave face and neck.  Do not bring valuables to the hospital.  [facility] is not responsible for any belongings or valuables.  Contacts, dentures or bridgework may not be worn into surgery.  Leave your suitcase in the car.  After surgery it may be brought to your room.  For patients admitted to the hospital, discharge time will be determined by your treatment team.  Patients discharged the day of surgery will not be allowed to drive home.   Name and phone number of your driver:   family Special instructions:  DO NOT smoke the morning of your procedure.  Please read over the following fact sheets that you were given. Anesthesia Post-op Instructions and Care and Recovery After Surgery       Upper Endoscopy, Adult, Care After This sheet gives you information about how to care for yourself after your procedure. Your health care provider may also give you more specific instructions. If you have problems or questions, contact your health care provider. What can I expect after the procedure? After the procedure, it is common to have:  A sore throat.  Mild stomach pain or discomfort.  Bloating.  Nausea. Follow these instructions at home:   Follow instructions from your health care provider about what to eat or drink after your procedure.  Return  to your normal activities as told by your health care provider. Ask your health care provider what activities are safe for you.  Take over-the-counter and prescription medicines only as told by your health care provider.  Do not drive for 24 hours if you were given a sedative during your procedure.  Keep all follow-up visits as told by your health care provider. This is important. Contact a health care provider if you have:  A sore throat that lasts longer than one day.  Trouble swallowing. Get help right away if:  You vomit blood or your vomit looks like coffee grounds.  You have: ? A fever. ? Bloody, black, or tarry stools. ? A severe sore throat or you cannot swallow. ? Difficulty breathing. ? Severe pain in your chest or abdomen. Summary  After the procedure, it is common to have a sore throat, mild stomach discomfort, bloating, and nausea.  Do not drive for 24 hours if you were given a sedative during the procedure.  Follow instructions from your health care provider about what to eat or drink after your procedure.  Return to your normal activities as told by your health care provider. This information is not intended to replace advice given to you by your health care provider. Make sure you discuss any questions you have with  your health care provider. Document Revised: 04/20/2018 Document Reviewed: 03/29/2018 Elsevier Patient Education  [place].  Esophageal Dilatation Esophageal dilatation, also called esophageal dilation, is a procedure to widen or open (dilate) a blocked or narrowed part of the esophagus. The esophagus is the part of the body that moves food and liquid from the mouth to the stomach. You may need this procedure if:  You have a buildup of scar tissue in your esophagus that makes it difficult, painful, or impossible to swallow. This can be caused by gastroesophageal reflux disease (GERD).  You have cancer of the esophagus.  There is a  problem with how food moves through your esophagus. In some cases, you may need this procedure repeated at a later time to dilate the esophagus gradually. Tell a health care provider about:  Any allergies you have.  All medicines you are taking, including vitamins, herbs, eye drops, creams, and over-the-counter medicines.  Any problems you or family members have had with anesthetic medicines.  Any blood disorders you have.  Any surgeries you have had.  Any medical conditions you have.  Any antibiotic medicines you are required to take before dental procedures.  Whether you are pregnant or may be pregnant. What are the risks? Generally, this is a safe procedure. However, problems may occur, including:  Bleeding due to a tear in the lining of the esophagus.  A hole (perforation) in the esophagus. What happens before the procedure?  Follow instructions from your health care provider about eating or drinking restrictions.  Ask your health care provider about changing or stopping your regular medicines. This is especially important if you are taking diabetes medicines or blood thinners.  Plan to have someone take you home from the hospital or clinic.  Plan to have a responsible adult care for you for at least 24 hours after you leave the hospital or clinic. This is important. What happens during the procedure?  You may be given a medicine to help you relax (sedative).  A numbing medicine may be sprayed into the back of your throat, or you may gargle the medicine.  Your health care provider may perform the dilatation using various surgical instruments, such as: ? Simple dilators. This instrument is carefully placed in the esophagus to stretch it. ? Guided wire bougies. This involves using an endoscope to insert a wire into the esophagus. A dilator is passed over this wire to enlarge the esophagus. Then the wire is removed. ? Balloon dilators. An endoscope with a small balloon at  the end is inserted into the esophagus. The balloon is inflated to stretch the esophagus and open it up. The procedure may vary among health care providers and hospitals. What happens after the procedure?  Your blood pressure, heart rate, breathing rate, and blood oxygen level will be monitored until the medicines you were given have worn off.  Your throat may feel slightly sore and numb. This will improve slowly over time.  You will not be allowed to eat or drink until your throat is no longer numb.  When you are able to drink, urinate, and sit on the edge of the bed without nausea or dizziness, you may be able to return home. Follow these instructions at home:  Take over-the-counter and prescription medicines only as told by your health care provider.  Do not drive for 24 hours if you were given a sedative during your procedure.  You should have a responsible adult with you for 24 hours after  the procedure.  Follow instructions from your health care provider about any eating or drinking restrictions.  Do not use any products that contain nicotine or tobacco, such as cigarettes and e-cigarettes. If you need help quitting, ask your health care provider.  Keep all follow-up visits as told by your health care provider. This is important. Get help right away if you:  Have a fever.  Have chest pain.  Have pain that is not relieved by medication.  Have trouble breathing.  Have trouble swallowing.  Vomit blood. Summary  Esophageal dilatation, also called esophageal dilation, is a procedure to widen or open (dilate) a blocked or narrowed part of the esophagus.  Plan to have someone take you home from the hospital or clinic.  For this procedure, a numbing medicine may be sprayed into the back of your throat, or you may gargle the medicine.  Do not drive for 24 hours if you were given a sedative during your procedure. This information is not intended to replace advice given to  you by your health care provider. Make sure you discuss any questions you have with your health care provider. Document Revised: 08/24/2019 Document Reviewed: 09/01/2017 Elsevier Patient Education  [place].  Colonoscopy, Adult, Care After This sheet gives you information about how to care for yourself after your procedure. Your health care provider may also give you more specific instructions. If you have problems or questions, contact your health care provider. What can I expect after the procedure? After the procedure, it is common to have:  A small amount of blood in your stool for 24 hours after the procedure.  Some gas.  Mild cramping or bloating of your abdomen. Follow these instructions at home: Eating and drinking   Drink enough fluid to keep your urine pale yellow.  Follow instructions from your health care provider about eating or drinking restrictions.  Resume your normal diet as instructed by your health care provider. Avoid heavy or fried foods that are hard to digest. Activity  Rest as told by your health care provider.  Avoid sitting for a long time without moving. Get up to take short walks every 1-2 hours. This is important to improve blood flow and breathing. Ask for help if you feel weak or unsteady.  Return to your normal activities as told by your health care provider. Ask your health care provider what activities are safe for you. Managing cramping and bloating   Try walking around when you have cramps or feel bloated.  Apply heat to your abdomen as told by your health care provider. Use the heat source that your health care provider recommends, such as a moist heat pack or a heating pad. ? Place a towel between your skin and the heat source. ? Leave the heat on for 20-30 minutes. ? Remove the heat if your skin turns bright red. This is especially important if you are unable to feel pain, heat, or cold. You may have a greater risk of getting  burned. General instructions  For the first 24 hours after the procedure: ? Do not drive or use machinery. ? Do not sign important documents. ? Do not drink alcohol. ? Do your regular daily activities at a slower pace than normal. ? Eat soft foods that are easy to digest.  Take over-the-counter and prescription medicines only as told by your health care provider.  Keep all follow-up visits as told by your health care provider. This is important. Contact a health care  provider if:  You have blood in your stool 2-3 days after the procedure. Get help right away if you have:  More than a small spotting of blood in your stool.  Large blood clots in your stool.  Swelling of your abdomen.  Nausea or vomiting.  A fever.  Increasing pain in your abdomen that is not relieved with medicine. Summary  After the procedure, it is common to have a small amount of blood in your stool. You may also have mild cramping and bloating of your abdomen.  For the first 24 hours after the procedure, do not drive or use machinery, sign important documents, or drink alcohol.  Get help right away if you have a lot of blood in your stool, nausea or vomiting, a fever, or increased pain in your abdomen. This information is not intended to replace advice given to you by your health care provider. Make sure you discuss any questions you have with your health care provider. Document Revised: 05/23/2019 Document Reviewed: 05/23/2019 Elsevier Patient Education  [place] After These instructions provide you with information about caring for yourself after your procedure. Your health care provider may also give you more specific instructions. Your treatment has been planned according to current medical practices, but problems sometimes occur. Call your health care provider if you have any problems or questions after your procedure. What can I expect after the  procedure? After your procedure, you may:  Feel sleepy for several hours.  Feel clumsy and have poor balance for several hours.  Feel forgetful about what happened after the procedure.  Have poor judgment for several hours.  Feel nauseous or vomit.  Have a sore throat if you had a breathing tube during the procedure. Follow these instructions at home: For at least 24 hours after the procedure:      Have a responsible adult stay with you. It is important to have someone help care for you until you are awake and alert.  Rest as needed.  Do not: ? Participate in activities in which you could fall or become injured. ? Drive. ? Use heavy machinery. ? Drink alcohol. ? Take sleeping pills or medicines that cause drowsiness. ? Make important decisions or sign legal documents. ? Take care of children on your own. Eating and drinking  Follow the diet that is recommended by your health care provider.  If you vomit, drink water, juice, or soup when you can drink without vomiting.  Make sure you have little or no nausea before eating solid foods. General instructions  Take over-the-counter and prescription medicines only as told by your health care provider.  If you have sleep apnea, surgery and certain medicines can increase your risk for breathing problems. Follow instructions from your health care provider about wearing your sleep device: ? Anytime you are sleeping, including during daytime naps. ? While taking prescription pain medicines, sleeping medicines, or medicines that make you drowsy.  If you smoke, do not smoke without supervision.  Keep all follow-up visits as told by your health care provider. This is important. Contact a health care provider if:  You keep feeling nauseous or you keep vomiting.  You feel light-headed.  You develop a rash.  You have a fever. Get help right away if:  You have trouble breathing. Summary  For several hours after your  procedure, you may feel sleepy and have poor judgment.  Have a responsible adult stay with you for at least 24  hours or until you are awake and alert. This information is not intended to replace advice given to you by your health care provider. Make sure you discuss any questions you have with your health care provider. Document Revised: 01/25/2018 Document Reviewed: 02/17/2016 Elsevier Patient Education  [place].

## 2020-10-03 ENCOUNTER — Other Ambulatory Visit: Payer: Self-pay

## 2020-10-03 ENCOUNTER — Encounter (HOSPITAL_COMMUNITY): Admission: RE | Admit: 2020-10-03 | Payer: Medicare Other | Source: Ambulatory Visit

## 2020-10-03 ENCOUNTER — Other Ambulatory Visit (HOSPITAL_COMMUNITY)
Admission: RE | Admit: 2020-10-03 | Discharge: 2020-10-03 | Disposition: A | Discharge status: Home / Self Care | Payer: Medicare Other | Source: Ambulatory Visit | Attending: Internal Medicine | Admitting: Internal Medicine

## 2020-10-03 ENCOUNTER — Encounter (HOSPITAL_COMMUNITY)
Admission: RE | Admit: 2020-10-03 | Discharge: 2020-10-03 | Disposition: A | Discharge status: Home / Self Care | Payer: Medicare Other | Source: Ambulatory Visit | Attending: Internal Medicine | Admitting: Internal Medicine

## 2020-10-03 DIAGNOSIS — Z20822 Contact with and (suspected) exposure to covid-19: Secondary | ICD-10-CM | POA: Diagnosis not present

## 2020-10-03 DIAGNOSIS — Z01812 Encounter for preprocedural laboratory examination: Secondary | ICD-10-CM | POA: Diagnosis present

## 2020-10-03 LAB — SARS CORONAVIRUS 2 (TAT 6-24 HRS): SARS Coronavirus 2: NEGATIVE

## 2020-10-08 ENCOUNTER — Telehealth: Payer: Self-pay | Admitting: *Deleted

## 2020-10-08 ENCOUNTER — Ambulatory Visit (HOSPITAL_COMMUNITY): Payer: Medicare Other | Admitting: Certified Registered"

## 2020-10-08 ENCOUNTER — Encounter (HOSPITAL_COMMUNITY): Payer: Self-pay | Admitting: Internal Medicine

## 2020-10-08 ENCOUNTER — Encounter (HOSPITAL_COMMUNITY)
Admission: RE | Disposition: A | Discharge status: Home / Self Care | Payer: Self-pay | Source: Home / Self Care | Attending: Internal Medicine

## 2020-10-08 ENCOUNTER — Ambulatory Visit (HOSPITAL_COMMUNITY)
Admission: RE | Admit: 2020-10-08 | Discharge: 2020-10-08 | Disposition: A | Discharge status: Home / Self Care | Payer: Medicare Other | Attending: Internal Medicine | Admitting: Internal Medicine

## 2020-10-08 DIAGNOSIS — Z7984 Long term (current) use of oral hypoglycemic drugs: Secondary | ICD-10-CM | POA: Insufficient documentation

## 2020-10-08 DIAGNOSIS — R1310 Dysphagia, unspecified: Secondary | ICD-10-CM

## 2020-10-08 DIAGNOSIS — K3189 Other diseases of stomach and duodenum: Secondary | ICD-10-CM | POA: Diagnosis not present

## 2020-10-08 DIAGNOSIS — Z791 Long term (current) use of non-steroidal anti-inflammatories (NSAID): Secondary | ICD-10-CM | POA: Diagnosis not present

## 2020-10-08 DIAGNOSIS — Z7982 Long term (current) use of aspirin: Secondary | ICD-10-CM | POA: Diagnosis not present

## 2020-10-08 DIAGNOSIS — Z794 Long term (current) use of insulin: Secondary | ICD-10-CM | POA: Diagnosis not present

## 2020-10-08 DIAGNOSIS — Z86010 Personal history of colonic polyps: Secondary | ICD-10-CM

## 2020-10-08 DIAGNOSIS — Z1211 Encounter for screening for malignant neoplasm of colon: Secondary | ICD-10-CM | POA: Diagnosis present

## 2020-10-08 DIAGNOSIS — K766 Portal hypertension: Secondary | ICD-10-CM | POA: Diagnosis not present

## 2020-10-08 DIAGNOSIS — Z79899 Other long term (current) drug therapy: Secondary | ICD-10-CM | POA: Diagnosis not present

## 2020-10-08 DIAGNOSIS — Z87891 Personal history of nicotine dependence: Secondary | ICD-10-CM | POA: Insufficient documentation

## 2020-10-08 DIAGNOSIS — K269 Duodenal ulcer, unspecified as acute or chronic, without hemorrhage or perforation: Secondary | ICD-10-CM

## 2020-10-08 HISTORY — PX: ESOPHAGOGASTRODUODENOSCOPY (EGD) WITH PROPOFOL: SHX5813

## 2020-10-08 HISTORY — PX: MALONEY DILATION: SHX5535

## 2020-10-08 HISTORY — PX: FLEXIBLE SIGMOIDOSCOPY: SHX5431

## 2020-10-08 HISTORY — PX: BIOPSY: SHX5522

## 2020-10-08 LAB — GLUCOSE, CAPILLARY
Glucose-Capillary: 87 mg/dL (ref 70–99)
Glucose-Capillary: 85 mg/dL (ref 70–99)

## 2020-10-08 SURGERY — ESOPHAGOGASTRODUODENOSCOPY (EGD) WITH PROPOFOL
Anesthesia: General

## 2020-10-08 MED ORDER — LIDOCAINE VISCOUS HCL 2 % MT SOLN
OROMUCOSAL | Status: AC
  Filled 2020-10-08: qty 15

## 2020-10-08 MED ORDER — CHLORHEXIDINE GLUCONATE CLOTH 2 % EX PADS: 6.0000 | MEDICATED_PAD | Freq: Once | CUTANEOUS | Status: DC

## 2020-10-08 MED ORDER — GLYCOPYRROLATE 0.2 MG/ML IJ SOLN
INTRAMUSCULAR | Status: AC
  Filled 2020-10-08: qty 1

## 2020-10-08 MED ORDER — LACTATED RINGERS IV SOLN
INTRAVENOUS | Status: DC
  Administered 2020-10-08: 1000 mL via INTRAVENOUS

## 2020-10-08 MED ORDER — GLYCOPYRROLATE 0.2 MG/ML IJ SOLN
0.2000 mg | Freq: Once | INTRAMUSCULAR | Status: AC
  Administered 2020-10-08: 0.2 mg via INTRAVENOUS

## 2020-10-08 MED ORDER — PROPOFOL 10 MG/ML IV BOLUS
INTRAVENOUS | Status: DC | PRN
  Administered 2020-10-08: 150 ug/kg/min via INTRAVENOUS
  Administered 2020-10-08: 100 mg via INTRAVENOUS

## 2020-10-08 MED ORDER — LIDOCAINE VISCOUS HCL 2 % MT SOLN
15.0000 mL | Freq: Once | OROMUCOSAL | Status: AC
  Administered 2020-10-08: 15 mL via OROMUCOSAL

## 2020-10-08 NOTE — Telephone Encounter (Signed)
Patient rescheduled

## 2020-10-08 NOTE — Anesthesia Postprocedure Evaluation (Signed)
Anesthesia Post Note  Patient: David Duarte  Procedure(s) Performed: ESOPHAGOGASTRODUODENOSCOPY (EGD) WITH PROPOFOL (N/A ) MALONEY DILATION (N/A ) BIOPSY FLEXIBLE SIGMOIDOSCOPY (N/A )  Patient location during evaluation: PACU Anesthesia Type: General Level of consciousness: awake, oriented, awake and alert and patient cooperative Pain management: pain level controlled Vital Signs Assessment: post-procedure vital signs reviewed and stable Respiratory status: spontaneous breathing, respiratory function stable, nonlabored ventilation and patient connected to nasal cannula oxygen Cardiovascular status: blood pressure returned to baseline and stable Postop Assessment: no headache and no backache Anesthetic complications: no   No complications documented.   Last Vitals:  Vitals:   10/08/20 0731  BP: (!) 155/80  Pulse: 63  Resp: 17  Temp: 36.7 C  SpO2: 98%    Last Pain:  Vitals:   10/08/20 0731  TempSrc: Oral  PainSc: 0-No pain                 Tacy Learn

## 2020-10-08 NOTE — Anesthesia Preprocedure Evaluation (Signed)
Anesthesia Evaluation  Patient identified by MRN, date of birth, ID band Patient awake    Reviewed: Allergy & Precautions, H&P , NPO status , Patient's Chart, lab work & pertinent test results, reviewed documented beta blocker date and time   Airway Mallampati: II  TM Distance: >3 FB Neck ROM: full    Dental no notable dental hx.    Pulmonary neg pulmonary ROS, former smoker,    Pulmonary exam normal breath sounds clear to auscultation       Cardiovascular Exercise Tolerance: Good negative cardio ROS   Rhythm:regular Rate:Normal     Neuro/Psych Anxiety negative neurological ROS  negative psych ROS   GI/Hepatic Neg liver ROS, hiatal hernia, GERD  Medicated,  Endo/Other  negative endocrine ROSdiabetes  Renal/GU negative Renal ROS  negative genitourinary   Musculoskeletal   Abdominal   Peds  Hematology  (+) Blood dyscrasia, anemia ,   Anesthesia Other Findings   Reproductive/Obstetrics negative OB ROS                             Anesthesia Physical Anesthesia Plan  ASA: III  Anesthesia Plan: General   Post-op Pain Management:    Induction:   PONV Risk Score and Plan: Propofol infusion  Airway Management Planned:   Additional Equipment:   Intra-op Plan:   Post-operative Plan:   Informed Consent: I have reviewed the patients History and Physical, chart, labs and discussed the procedure including the risks, benefits and alternatives for the proposed anesthesia with the patient or authorized representative who has indicated his/her understanding and acceptance.     Dental Advisory Given  Plan Discussed with: CRNA  Anesthesia Plan Comments:         Anesthesia Quick Evaluation

## 2020-10-08 NOTE — Discharge Instructions (Signed)
Colonoscopy Discharge Instructions  Read the instructions outlined below and refer to this sheet in the next few weeks. These discharge instructions provide you with general information on caring for yourself after you leave the hospital. Your doctor may also give you specific instructions. While your treatment has been planned according to the most current medical practices available, unavoidable complications occasionally occur. If you have any problems or questions after discharge, call Dr. Gala Romney at [phone]. ACTIVITY  You may resume your regular activity, but move at a slower pace for the next 24 hours.   Take frequent rest periods for the next 24 hours.   Walking will help get rid of the air and reduce the bloated feeling in your belly (abdomen).   No driving for 24 hours (because of the medicine (anesthesia) used during the test).    Do not sign any important legal documents or operate any machinery for 24 hours (because of the anesthesia used during the test).  NUTRITION  Drink plenty of fluids.   You may resume your normal diet as instructed by your doctor.   Begin with a light meal and progress to your normal diet. Heavy or fried foods are harder to digest and may make you feel sick to your stomach (nauseated).   Avoid alcoholic beverages for 24 hours or as instructed.  MEDICATIONS  You may resume your normal medications unless your doctor tells you otherwise.  WHAT YOU CAN EXPECT TODAY  Some feelings of bloating in the abdomen.   Passage of more gas than usual.   Spotting of blood in your stool or on the toilet paper.  IF YOU HAD POLYPS REMOVED DURING THE COLONOSCOPY:  No aspirin products for 7 days or as instructed.   No alcohol for 7 days or as instructed.   Eat a soft diet for the next 24 hours.  FINDING OUT THE RESULTS OF YOUR TEST Not all test results are available during your visit. If your test results are not back during the visit, make an appointment  with your caregiver to find out the results. Do not assume everything is normal if you have not heard from your caregiver or the medical facility. It is important for you to follow up on all of your test results.  SEEK IMMEDIATE MEDICAL ATTENTION IF:  You have more than a spotting of blood in your stool.   Your belly is swollen (abdominal distention).   You are nauseated or vomiting.   You have a temperature over 101.   You have abdominal pain or discomfort that is severe or gets worse throughout the day.  \ \ EGD Discharge instructions Please read the instructions outlined below and refer to this sheet in the next few weeks. These discharge instructions provide you with general information on caring for yourself after you leave the hospital. Your doctor may also give you specific instructions. While your treatment has been planned according to the most current medical practices available, unavoidable complications occasionally occur. If you have any problems or questions after discharge, please call your doctor. ACTIVITY  You may resume your regular activity but move at a slower pace for the next 24 hours.   Take frequent rest periods for the next 24 hours.   Walking will help expel (get rid of) the air and reduce the bloated feeling in your abdomen.   No driving for 24 hours (because of the anesthesia (medicine) used during the test).   You may shower.   Do not sign  any important legal documents or operate any machinery for 24 hours (because of the anesthesia used during the test).  NUTRITION  Drink plenty of fluids.   You may resume your normal diet.   Begin with a light meal and progress to your normal diet.   Avoid alcoholic beverages for 24 hours or as instructed by your caregiver.  MEDICATIONS  You may resume your normal medications unless your caregiver tells you otherwise.  WHAT YOU CAN EXPECT TODAY  You may experience abdominal discomfort such as a feeling of  fullness or "gas" pains.  FOLLOW-UP  Your doctor will discuss the results of your test with you.  SEEK IMMEDIATE MEDICAL ATTENTION IF ANY OF THE FOLLOWING OCCUR:  Excessive nausea (feeling sick to your stomach) and/or vomiting.   Severe abdominal pain and distention (swelling).   Trouble swallowing.   Temperature over 101 F (37.8 C).   Rectal bleeding or vomiting of blood.   Your esophagus was dilated in your stomach was biopsied (mild inflammation)  Colonoscopy could not be completed today because of a poor preparation (likely directly related to your eating a hamburger the day before the procedure)  This visit with [place] in 4 weeks  At patient request, I called [facility] at [phone]  -no contact

## 2020-10-08 NOTE — Telephone Encounter (Signed)
-----   Message from Daneil Dolin, MD sent at 10/08/2020  8:38 AM EST ----- Patient ate a hamburger yesterday and of course had a poor prep aborted colonoscopy. Said he did not know.  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  He is to be redirected back to the office and we will regroup.  A face-to-face visit.  Thank you.

## 2020-10-08 NOTE — Op Note (Signed)
[facility] Patient Name: David Duarte Procedure Date: 10/08/2020 8:00 AM MRN: [id number] Date of Birth: February 14, 1941 Attending MD: Norvel Richards , MD CSN: [id number] Age: 79 Admit Type: Outpatient Procedure:                Upper GI endoscopy Indications:              Dysphagia Providers:                Norvel Richards, MD, Janeece Riggers, RN, Lambert Mody, Aram Candela Referring MD:              Medicines:                Propofol per Anesthesia Complications:            No immediate complications. Estimated Blood Loss:     Estimated blood loss was minimal. Procedure:                Pre-Anesthesia Assessment:                           - Prior to the procedure, a History and Physical                            was performed, and patient medications and                            allergies were reviewed. The patient's tolerance of                            previous anesthesia was also reviewed. The risks                            and benefits of the procedure and the sedation                            options and risks were discussed with the patient.                            All questions were answered, and informed consent                            was obtained. Prior Anticoagulants: The patient has                            taken no previous anticoagulant or antiplatelet                            agents. ASA Grade Assessment: III - A patient with                            severe systemic disease. After reviewing the risks  and benefits, the patient was deemed in                            satisfactory condition to undergo the procedure.                           After obtaining informed consent, the endoscope was                            passed under direct vision. Throughout the                            procedure, the patient's blood pressure, pulse, and                            oxygen  saturations were monitored continuously. The                            Endoscope was introduced through the mouth, and                            advanced to the second part of duodenum. The upper                            GI endoscopy was accomplished without difficulty.                            The patient tolerated the procedure well. Scope In: 8:12:47 AM Scope Out: 8:20:43 AM Total Procedure Duration: 0 hours 7 minutes 56 seconds  Findings:      The examined esophagus was normal.      Mild portal hypertensive gastropathy was found in the entire examined       stomach.      A few localized erosions with no stigmata of recent bleeding were found       in the gastric antrum. This was biopsied with a cold forceps for       histology. Estimated blood loss was minimal.      A few localized erosions without bleeding were found in the duodenal       bulb. The scope was withdrawn. Dilation was performed with a Maloney       dilator with no resistance at 56 Fr. Dilation was performed with a       Maloney dilator with no resistance at 56 Fr. Dilation was performed with       a Maloney dilator with mild resistance at 60 Fr. The dilation site was       examined following endoscope reinsertion and showed no change. Estimated       blood loss: none. Impression:               - Normal esophagus. Dilated.                           - Portal hypertensive gastropathy.                           - Erosive gastropathy with no  stigmata of recent                            bleeding. Biopsied.                           - Duodenal erosions without bleeding. Moderate Sedation:      Moderate (conscious) sedation was personally administered by an       anesthesia professional. The following parameters were monitored: oxygen       saturation, heart rate, blood pressure, respiratory rate, EKG, adequacy       of pulmonary ventilation, and response to care. Recommendation:           - Patient has a contact  number available for                            emergencies. The signs and symptoms of potential                            delayed complications were discussed with the                            patient. Return to normal activities tomorrow.                            Written discharge instructions were provided to the                            patient.                           - Resume previous diet.                           - Continue present medications.                           - Return to my office (date not yet determined).                            See colonoscopy report Procedure Code(s):        --- Professional ---                           [phone], Esophagogastroduodenoscopy, flexible,                            transoral; with biopsy, single or multiple                           43450, Dilation of esophagus, by unguided sound or                            bougie, single or multiple passes Diagnosis Code(s):        --- Professional ---  K76.6, Portal hypertension                           K31.89, Other diseases of stomach and duodenum                           K26.9, Duodenal ulcer, unspecified as acute or                            chronic, without hemorrhage or perforation                           R13.10, Dysphagia, unspecified CPT copyright 2019 American Medical Association. All rights reserved. The codes documented in this report are preliminary and upon coder review may  be revised to meet current compliance requirements. [name]. Alfonzo Arca, MD Norvel Richards, MD 10/08/2020 8:29:58 AM This report has been signed electronically. Number of Addenda: 0

## 2020-10-08 NOTE — Telephone Encounter (Signed)
David Duarte, please schedule OV with David Duarte and mail letter. Thanks
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
## 2020-10-08 NOTE — Op Note (Signed)
[facility] Patient Name: David Duarte Procedure Date: 10/08/2020 8:23 AM MRN: [id number] Date of Birth: October 12, 1941 Attending MD: Norvel Richards , MD CSN: [id number] Age: 79 Admit Type: Outpatient Procedure:                Colonoscopy (attempted) Indications:              High risk colon cancer surveillance: Personal                            history of colonic polyps Providers:                Norvel Richards, MD, Janeece Riggers, RN, Lambert Mody, Aram Candela Referring MD:              Medicines:                Propofol per Anesthesia Complications:            No immediate complications. Estimated Blood Loss:     Estimated blood loss: none. Procedure:                Pre-Anesthesia Assessment:                           - Prior to the procedure, a History and Physical                            was performed, and patient medications and                            allergies were reviewed. The patient's tolerance of                            previous anesthesia was also reviewed. The risks                            and benefits of the procedure and the sedation                            options and risks were discussed with the patient.                            All questions were answered, and informed consent                            was obtained. Prior Anticoagulants: The patient has                            taken no previous anticoagulant or antiplatelet                            agents. ASA Grade Assessment: III - A patient with  severe systemic disease. After reviewing the risks                            and benefits, the patient was deemed in                            satisfactory condition to undergo the procedure.                           After obtaining informed consent, the colonoscope                            was passed under direct vision. Throughout the                             procedure, the patient's blood pressure, pulse, and                            oxygen saturations were monitored continuously. The                            CF-[id number] ([id number]) scope was introduced through                            the anus with the intention of advancing to the                            cecum. The scope was advanced to the sigmoid colon                            before the procedure was aborted. Medications were                            given. The colonoscopy was performed without                            difficulty. The patient tolerated the procedure                            well. The quality of the bowel preparation was                            inadequate. Scope In: 8:26:09 AM Scope Out: 8:28:15 AM Total Procedure Duration: 0 hours 2 minutes 6 seconds  Findings:      The perianal and digital rectal examinations were normal.      Formed stool in the rectum trailing up into the sigmoid which occluded       performance of a colonoscopy today. Procedure aborted. Impression:               - Preparation of the colon was inadequate.                           - The sigmoid colon is normal.                           -  No specimens collected. Inadequate preparation                            today likely a result of the hamburger patient ate                            evening before procedure Moderate Sedation:      Moderate (conscious) sedation was personally administered by an       anesthesia professional. The following parameters were monitored: oxygen       saturation, heart rate, blood pressure, respiratory rate, EKG, adequacy       of pulmonary ventilation, and response to care. Recommendation:           - Patient has a contact number available for                            emergencies. The signs and symptoms of potential                            delayed complications were discussed with the                            patient. Return to normal activities  tomorrow.                            Written discharge instructions were provided to the                            patient.                           - Advance diet as tolerated.                           - Continue present medications.                           - Return to my office in 4 weeks. For consideration                            of letter rescheduling colonoscopy. See EGD report. Procedure Code(s):        --- Professional ---                           [id number], 49, Colonoscopy, flexible; diagnostic,                            including collection of specimen(s) by brushing or                            washing, when performed (separate procedure) Diagnosis Code(s):        --- Professional ---                           Z86.010, Personal history of colonic polyps CPT copyright 2019 American Medical Association.  All rights reserved. The codes documented in this report are preliminary and upon coder review may  be revised to meet current compliance requirements. [name]. Leshawn Straka, MD Norvel Richards, MD 10/08/2020 8:33:27 AM This report has been signed electronically. Number of Addenda: 0

## 2020-10-08 NOTE — Transfer of Care (Signed)
Immediate Anesthesia Transfer of Care Note  Patient: RITTER HELSLEY  Procedure(s) Performed: ESOPHAGOGASTRODUODENOSCOPY (EGD) WITH PROPOFOL (N/A ) MALONEY DILATION (N/A ) BIOPSY FLEXIBLE SIGMOIDOSCOPY (N/A )  Patient Location: PACU  Anesthesia Type:General  Level of Consciousness: awake, alert , oriented and patient cooperative  Airway & Oxygen Therapy: Patient Spontanous Breathing and Patient connected to nasal cannula oxygen  Post-op Assessment: Report given to RN, Post -op Vital signs reviewed and stable and Patient moving all extremities  Post vital signs: Reviewed and stable  Last Vitals:  Vitals Value Taken Time  BP 132/68 10/08/20 0831  Temp    Pulse 81 10/08/20 0832  Resp 14 10/08/20 0833  SpO2 76 % 10/08/20 0832  Vitals shown include unvalidated device data.  Last Pain:  Vitals:   10/08/20 0731  TempSrc: Oral  PainSc: 0-No pain      Patients Stated Pain Goal: 8 (01/65/53 7482)  Complications: No complications documented.

## 2020-10-09 ENCOUNTER — Other Ambulatory Visit: Payer: Self-pay

## 2020-10-09 LAB — SURGICAL PATHOLOGY

## 2020-10-10 ENCOUNTER — Encounter: Payer: Self-pay | Admitting: Internal Medicine

## 2020-10-10 ENCOUNTER — Encounter (HOSPITAL_COMMUNITY): Payer: Self-pay | Admitting: Internal Medicine
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
## 2020-11-15 ENCOUNTER — Other Ambulatory Visit: Payer: Self-pay

## 2020-11-15 ENCOUNTER — Encounter: Payer: Self-pay | Admitting: Gastroenterology

## 2020-11-15 ENCOUNTER — Ambulatory Visit (INDEPENDENT_AMBULATORY_CARE_PROVIDER_SITE_OTHER): Payer: No Typology Code available for payment source | Admitting: Gastroenterology

## 2020-11-15 ENCOUNTER — Other Ambulatory Visit: Payer: Self-pay | Admitting: Gastroenterology

## 2020-11-15 VITALS — BP 163/75 | HR 77 | Temp 97.1°F | Ht 69.0 in | Wt 225.6 lb

## 2020-11-15 DIAGNOSIS — R162 Hepatomegaly with splenomegaly, not elsewhere classified: Secondary | ICD-10-CM

## 2020-11-15 DIAGNOSIS — K7460 Unspecified cirrhosis of liver: Secondary | ICD-10-CM

## 2020-11-15 DIAGNOSIS — D649 Anemia, unspecified: Secondary | ICD-10-CM

## 2020-11-15 DIAGNOSIS — R1319 Other dysphagia: Secondary | ICD-10-CM | POA: Diagnosis not present

## 2020-11-15 DIAGNOSIS — K219 Gastro-esophageal reflux disease without esophagitis: Secondary | ICD-10-CM | POA: Diagnosis not present

## 2020-11-15 DIAGNOSIS — Z860100 Personal history of colon polyps, unspecified: Secondary | ICD-10-CM

## 2020-11-15 DIAGNOSIS — K5900 Constipation, unspecified: Secondary | ICD-10-CM

## 2020-11-15 LAB — CBC WITH DIFFERENTIAL/PLATELET
Absolute Monocytes: 1265 cells/uL — ABNORMAL HIGH (ref 200–950)
Basophils Absolute: 71 cells/uL (ref 0–200)
Basophils Relative: 0.7 %
Eosinophils Absolute: 153 cells/uL (ref 15–500)
Eosinophils Relative: 1.5 %
HCT: 31.9 % — ABNORMAL LOW (ref 38.5–50.0)
Hemoglobin: 10.4 g/dL — ABNORMAL LOW (ref 13.2–17.1)
Lymphs Abs: 1775 cells/uL (ref 850–3900)
MCH: 32.3 pg (ref 27.0–33.0)
MCHC: 32.6 g/dL (ref 32.0–36.0)
MCV: 99.1 fL (ref 80.0–100.0)
MPV: 12.9 fL — ABNORMAL HIGH (ref 7.5–12.5)
Monocytes Relative: 12.4 %
Neutro Abs: 6936 cells/uL (ref 1500–7800)
Neutrophils Relative %: 68 %
Platelets: 170 10*3/uL (ref 140–400)
RBC: 3.22 10*6/uL — ABNORMAL LOW (ref 4.20–5.80)
RDW: 13.6 % (ref 11.0–15.0)
Total Lymphocyte: 17.4 %
WBC: 10.2 10*3/uL (ref 3.8–10.8)

## 2020-11-15 NOTE — Patient Instructions (Addendum)
Please have labs completed with your primary care provider.  They will need to fax results back to her office.  Our fax number is [phone].  We will get you rescheduled for colonoscopy in the near future with Dr. Jena Gauss. 1 week prior to procedure, start Amitiza 24 mcg daily to ensure her bowels are moving well.  Diabetes medication adjustments for procedure: 1 day prior to procedure: One half dose glipizide (5 mg), one half dose Januvia (50 mg), one half dose Metformin (500 mg in the morning and evening), one half dose Tresiba (40 units). Day of procedure: Do not take any morning diabetes medications. *Keep a close check on your blood sugars while you are on clear liquids.  Correct any low blood sugars with sugary clear liquids.  For constipation: As her bowels are moving well at this time, ensure you are drinking enough water to keep your urine pale yellow to clear.  Continue to consume plenty of fiber in the way of fruits and vegetables. If you skip 1-2 days without a bowel movement, go ahead and take Amitiza 24 mcg.  For GERD:  Continue taking Protonix 40 mg daily 30 minutes before breakfast.  For cirrhosis: - It is very important that you follow a strict low-sodium diet.  No more than 2000 mg/day.  This includes all fluids and liquids consumed. - No more than 2000 mg of Tylenol daily. - Complete hepatitis A/B vaccine series. - You will be due for repeat ultrasound and repeat labs in March 2022.  We will arrange this for you. - Monitor for any swelling in her lower extremities, swelling in her abdomen, changes in mental status, yellowing of your eyes or skin, or significant bruising/bleeding and let us know if this occurs.  We will plan to see you back in 6 months.  David Memos, PA-C [facility]    Low-Sodium Eating Plan Sodium, which is an element that makes up salt, helps you maintain a healthy balance of fluids in your body. Too much sodium can increase your  blood pressure and cause fluid and waste to be held in your body. Your health care provider or dietitian may recommend following this plan if you have high blood pressure (hypertension), kidney disease, liver disease, or heart failure. Eating less sodium can help lower your blood pressure, reduce swelling, and protect your heart, liver, and kidneys. What are tips for following this plan? General guidelines  Most people on this plan should limit their sodium intake to 1,500-2,000 mg (milligrams) of sodium each day. Reading food labels   The Nutrition Facts label lists the amount of sodium in one serving of the food. If you eat more than one serving, you must multiply the listed amount of sodium by the number of servings.  Choose foods with less than 140 mg of sodium per serving.  Avoid foods with 300 mg of sodium or more per serving. Shopping  Look for lower-sodium products, often labeled as "low-sodium" or "no salt added."  Always check the sodium content even if foods are labeled as "unsalted" or "no salt added".  Buy fresh foods. ? Avoid canned foods and premade or frozen meals. ? Avoid canned, cured, or processed meats  Buy breads that have less than 80 mg of sodium per slice. Cooking  Eat more home-cooked food and less restaurant, buffet, and fast food.  Avoid adding salt when cooking. Use salt-free seasonings or herbs instead of table salt or sea salt. Check with your health care provider or  pharmacist before using salt substitutes.  Cook with plant-based oils, such as canola, sunflower, or olive oil. Meal planning  When eating at a restaurant, ask that your food be prepared with less salt or no salt, if possible.  Avoid foods that contain MSG (monosodium glutamate). MSG is sometimes added to [place] food, bouillon, and some canned foods. What foods are recommended? The items listed may not be a complete list. Talk with your dietitian about what dietary choices are best for  you. Grains Low-sodium cereals, including oats, puffed wheat and rice, and shredded wheat. Low-sodium crackers. Unsalted rice. Unsalted pasta. Low-sodium bread. Whole-grain breads and whole-grain pasta. Vegetables Fresh or frozen vegetables. "No salt added" canned vegetables. "No salt added" tomato sauce and paste. Low-sodium or reduced-sodium tomato and vegetable juice. Fruits Fresh, frozen, or canned fruit. Fruit juice. Meats and other protein foods Fresh or frozen (no salt added) meat, poultry, seafood, and fish. Low-sodium canned tuna and salmon. Unsalted nuts. Dried peas, beans, and lentils without added salt. Unsalted canned beans. Eggs. Unsalted nut butters. Dairy Milk. Soy milk. Cheese that is naturally low in sodium, such as ricotta cheese, fresh mozzarella, or Swiss cheese Low-sodium or reduced-sodium cheese. Cream cheese. Yogurt. Fats and oils Unsalted butter. Unsalted margarine with no trans fat. Vegetable oils such as canola or olive oils. Seasonings and other foods Fresh and dried herbs and spices. Salt-free seasonings. Low-sodium mustard and ketchup. Sodium-free salad dressing. Sodium-free light mayonnaise. Fresh or refrigerated horseradish. Lemon juice. Vinegar. Homemade, reduced-sodium, or low-sodium soups. Unsalted popcorn and pretzels. Low-salt or salt-free chips. What foods are not recommended? The items listed may not be a complete list. Talk with your dietitian about what dietary choices are best for you. Grains Instant hot cereals. Bread stuffing, pancake, and biscuit mixes. Croutons. Seasoned rice or pasta mixes. Noodle soup cups. Boxed or frozen macaroni and cheese. Regular salted crackers. Self-rising flour. Vegetables Sauerkraut, pickled vegetables, and relishes. Olives. [place] fries. Onion rings. Regular canned vegetables (not low-sodium or reduced-sodium). Regular canned tomato sauce and paste (not low-sodium or reduced-sodium). Regular tomato and vegetable juice (not  low-sodium or reduced-sodium). Frozen vegetables in sauces. Meats and other protein foods Meat or fish that is salted, canned, smoked, spiced, or pickled. Bacon, ham, sausage, hotdogs, corned beef, chipped beef, packaged lunch meats, salt pork, jerky, pickled herring, anchovies, regular canned tuna, sardines, salted nuts. Dairy Processed cheese and cheese spreads. Cheese curds. Blue cheese. Feta cheese. String cheese. Regular cottage cheese. Buttermilk. Canned milk. Fats and oils Salted butter. Regular margarine. Ghee. Bacon fat. Seasonings and other foods Onion salt, garlic salt, seasoned salt, table salt, and sea salt. Canned and packaged gravies. Worcestershire sauce. Tartar sauce. Barbecue sauce. Teriyaki sauce. Soy sauce, including reduced-sodium. Steak sauce. Fish sauce. Oyster sauce. Cocktail sauce. Horseradish that you find on the shelf. Regular ketchup and mustard. Meat flavorings and tenderizers. Bouillon cubes. Hot sauce and Tabasco sauce. Premade or packaged marinades. Premade or packaged taco seasonings. Relishes. Regular salad dressings. Salsa. Potato and tortilla chips. Corn chips and puffs. Salted popcorn and pretzels. Canned or dried soups. Pizza. Frozen entrees and pot pies. Summary  Eating less sodium can help lower your blood pressure, reduce swelling, and protect your heart, liver, and kidneys.  Most people on this plan should limit their sodium intake to 1,500-2,000 mg (milligrams) of sodium each day.  Canned, boxed, and frozen foods are high in sodium. Restaurant foods, fast foods, and pizza are also very high in sodium. You also get sodium by adding salt  to food.  Try to cook at home, eat more fresh fruits and vegetables, and eat less fast food, canned, processed, or prepared foods. This information is not intended to replace advice given to you by your health care provider. Make sure you discuss any questions you have with your health care provider. Document Revised:  10/09/2017 Document Reviewed: 10/20/2016 Elsevier Patient Education  2020 [employer].

## 2020-11-16 ENCOUNTER — Encounter: Payer: Self-pay | Admitting: Gastroenterology

## 2020-11-16 NOTE — Assessment & Plan Note (Addendum)
80 year old male with mild normocytic anemia dating back to 2018. Kidney function within normal limits.  Most recent hemoglobin 10.1 in September 2021 with elevated MCV of 105.5. Hemoglobin was down from baseline of low 12 range. Iron panel, vitamin B12, and folate within normal limits.  Denies overt GI bleeding.  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  History of tubular adenomas in 2018 with attempted colonoscopy in November 2021, but due to poor prep, procedure was aborted. Denies NSAIDs.   Etiology of anemia is not clear. Could have intermittent oozing from portal hypertensive gastropathy although iron panel is not suggestive of anemia secondary to chronic blood loss.  He continues on Protonix 40 mg daily.  Will proceed with colonoscopy to rule out lower GI source including malignancy. I spent a great deal of time today discussing colon prep with patient and his wife as patient did not follow instructions for clear liquids for his last procedure.   Plan:  Update CBC. Proceed with colonoscopy with propofol with Dr. Gala Romney in the near future. The risks, benefits, and alternatives have been discussed with the patient in detail. The patient states understanding and desires to proceed.  ASA III  - Discussed bowel prep extensively.  -Requested patient take Amitiza 24 mcg daily x1 week prior to colon prep to ensure bowels are moving well.   -See separate instructions for diabetes medication adjustments. Follow-up after colonoscopy.

## 2020-11-16 NOTE — Assessment & Plan Note (Signed)
Resolved s/p EGD 10/08/2020.  Esophagus appeared normal, but empiric dilation was performed.  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  GERD remains well controlled on Protonix 40 mg daily.  Advised to monitor for return of symptoms.

## 2020-11-16 NOTE — Assessment & Plan Note (Addendum)
Well-controlled on Protonix 40 mg daily.  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 Advised he continue Protonix 40 mg daily.  Continue to avoid NSAIDs.

## 2020-11-16 NOTE — Assessment & Plan Note (Addendum)
80 year old male who likely has early cirrhosis.  CT angiography October 2018 with diffuse hepatic steatosis with morphologic changes reflecting early cirrhosis, spleen appeared normal.  Recent abdominal ultrasound September 2021 with hepatosplenomegaly with at least moderate hepatic steatosis.  Liver contour appeared smooth.  EGD 10/08/2020 without varices but evidence of portal hypertensive gastropathy.  LFTs, platelets, INR, AFP remain within normal limits.  No immunity to hepatitis A or B.  He has started vaccine series.  No signs or symptoms of decompensated liver disease.  He does have intermittent forgetfulness but ammonia wnl. 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   With suspected cirrhosis on prior CT, hepatosplenomegaly, and portal hypertensive gastropathy on EGD, suspect early cirrhosis, likely secondary to history of heavy alcohol use (abstinant for 1.5 years) as well as fatty liver. Will plan to continue to follow him along every 6 for routine cirrhosis care.   Plan: Repeat CBC, CMP, INR, AFP, and RUQ ultrasound in March 2022. Repeat EGD in November 2023. 2 g sodium diet.  Counseled on this and provided handout. No more than 2000 mg Tylenol daily. Complete hep A/B vaccine series. Advised to monitor for swelling in lower extremities, swelling in his abdomen, change in mental status, yellowing of eyes or skin, significant bruising/bleeding and let us know if this occurs. Plan to follow-up in office after colonoscopy.

## 2020-11-16 NOTE — Assessment & Plan Note (Addendum)
80 year old male with history of adenomatous colon polyps.  Last colonoscopy in March 2018 with three 4-7 mm polyps, 2 of which were tubular adenomas.  He was due for repeat colonoscopy in March 2021.  Attempted colonoscopy in November 2021, but patient did not follow prep instructions and ate a hamburger the day prior to his procedure, thus he had a poor prep and colonoscopy was aborted.  He has chronic history of constipation, but reports currently having BMs daily, using Amitiza 24 mcg as needed.  Denies BRBPR or melena.  He has been losing weight, but reports this is intentional.  Also with history of anemia dating back to 2018.  Hemoglobin had been in the low 12 range, but in September 2021, hemoglobin was 10.1.  Iron panel, vitamin B12, and folate within normal limits.  Kidney function also within normal limits.  Plan: Proceed with colonoscopy with propofol Dr. Gala Romney in the near future. The risks, benefits, and alternatives have been discussed with the patient in detail. The patient states understanding and desires to proceed.  ASA III  -Discussed prep instructions extensively with patient and his wife.  -Requested patient take Amitiza 24 mcg daily x1 week prior to procedure to ensure bowels are moving well.  -See separate instructions for diabetes medication adjustments. Follow-up after procedure.

## 2020-11-16 NOTE — Assessment & Plan Note (Addendum)
Chronic history of constipation with associated lower abdominal pain, nausea, and intermittent vomiting if unable to have a BM.  Tried Linzess 145 mcg daily, but he continued with abdominal cramping with sensation of needing to have a BM but could not.  Transitioned to Amitiza 24 mcg twice daily which helped significantly and actually resulted in diarrhea.  He decreased this to once daily which has worked well.  Over the last 2 weeks, he has been having bowel movements daily without Amitiza.  We discussed trying Amitiza 8 mcg twice daily, but patient declined.  He prefers to use Amitiza 24 mcg as needed.  Denies BRBPR or melena.  Has intentionally been trying to lose weight.  He does have history of adenomatous colon polyps and is overdue for surveillance colonoscopy as per below.   Plan: Continue Amitiza 24 mcg as needed. Follow-up after colonoscopy as per below.

## 2020-11-20 ENCOUNTER — Telehealth: Payer: Self-pay | Admitting: Internal Medicine

## 2020-11-20 NOTE — Telephone Encounter (Signed)
Spoke with pt. Pt was notified of results. 

## 2020-11-20 NOTE — Telephone Encounter (Signed)
Returning call. [phone] °

## 2020-11-24 ENCOUNTER — Other Ambulatory Visit: Payer: Self-pay | Admitting: Orthopaedic Surgery

## 2020-11-26 NOTE — Telephone Encounter (Signed)
Ok to rf? 

## 2020-12-12 ENCOUNTER — Encounter: Payer: Self-pay | Admitting: *Deleted

## 2020-12-19 LAB — COMPREHENSIVE METABOLIC PANEL
AG Ratio: 1.6 (calc) (ref 1.0–2.5)
ALT: 14 U/L (ref 9–46)
AST: 15 U/L (ref 10–35)
Albumin: 4.5 g/dL (ref 3.6–5.1)
Alkaline phosphatase (APISO): 41 U/L (ref 35–144)
BUN: 23 mg/dL (ref 7–25)
CO2: 29 mmol/L (ref 20–32)
Calcium: 9.6 mg/dL (ref 8.6–10.3)
Chloride: 103 mmol/L (ref 98–110)
Creat: 0.92 mg/dL (ref 0.70–1.18)
Globulin: 2.9 g/dL (calc) (ref 1.9–3.7)
Glucose, Bld: 57 mg/dL — ABNORMAL LOW (ref 65–99)
Potassium: 4.9 mmol/L (ref 3.5–5.3)
Sodium: 139 mmol/L (ref 135–146)
Total Bilirubin: 0.7 mg/dL (ref 0.2–1.2)
Total Protein: 7.4 g/dL (ref 6.1–8.1)

## 2020-12-19 LAB — CBC WITH DIFFERENTIAL/PLATELET
Absolute Monocytes: 948 cells/uL (ref 200–950)
Basophils Absolute: 74 cells/uL (ref 0–200)
Basophils Relative: 0.8 %
Eosinophils Absolute: 221 cells/uL (ref 15–500)
Eosinophils Relative: 2.4 %
HCT: 33.8 % — ABNORMAL LOW (ref 38.5–50.0)
Hemoglobin: 10.9 g/dL — ABNORMAL LOW (ref 13.2–17.1)
Lymphs Abs: 1674 cells/uL (ref 850–3900)
MCH: 31.9 pg (ref 27.0–33.0)
MCHC: 32.2 g/dL (ref 32.0–36.0)
MCV: 98.8 fL (ref 80.0–100.0)
MPV: 12.8 fL — ABNORMAL HIGH (ref 7.5–12.5)
Monocytes Relative: 10.3 %
Neutro Abs: 6284 cells/uL (ref 1500–7800)
Neutrophils Relative %: 68.3 %
Platelets: 177 10*3/uL (ref 140–400)
RBC: 3.42 10*6/uL — ABNORMAL LOW (ref 4.20–5.80)
RDW: 13.7 % (ref 11.0–15.0)
Total Lymphocyte: 18.2 %
WBC: 9.2 10*3/uL (ref 3.8–10.8)

## 2020-12-19 LAB — AFP TUMOR MARKER: AFP-Tumor Marker: 1.9 ng/mL (ref <6.1)

## 2020-12-19 LAB — PROTIME-INR
INR: 1
Prothrombin Time: 10 s (ref 9.0–11.5)

## 2020-12-24 NOTE — Patient Instructions (Signed)
David Duarte  12/24/2020     @PREFPERIOPPHARMACY @   Your procedure is scheduled on  12/27/2020.   Report to [facility] at  0700  A.M.   Call this number if you have problems the morning of surgery:  [phone]     Remember:  Follow the diet and prep instructions given to you by the office.                     Take these medicines the morning of surgery with A SIP OF WATER     Aricept, lexapro, mobic(if needed), protonix. Take 40 units of tresiba the night before your procedure.    DO NOT take anu medications for diabetes the morning of your procedure.    Please brush your teeth.  Do not wear jewelry, make-up or nail polish.  Do not wear lotions, powders, or perfumes, or deodorant.  Do not shave 48 hours prior to surgery.  Men may shave face and neck.  Do not bring valuables to the hospital.  [facility] is not responsible for any belongings or valuables.  Contacts, dentures or bridgework may not be worn into surgery.  Leave your suitcase in the car.  After surgery it may be brought to your room.  For patients admitted to the hospital, discharge time will be determined by your treatment team.  Patients discharged the day of surgery will not be allowed to drive home and  Must have someone with them for 24 hours.    Special instructions:   DO NOT smoke tobacco or vape the morning of your procedure.   Please read over the following fact sheets that you were given. Anesthesia Post-op Instructions and Care and Recovery After Surgery       Colonoscopy, Adult, Care After This sheet gives you information about how to care for yourself after your procedure. Your health care provider may also give you more specific instructions. If you have problems or questions, contact your health care provider. What can I expect after the procedure? After the procedure, it is common to have:  A small amount of blood in your stool for 24 hours after the procedure.  Some  gas.  Mild cramping or bloating of your abdomen. Follow these instructions at home: Eating and drinking  Drink enough fluid to keep your urine pale yellow.  Follow instructions from your health care provider about eating or drinking restrictions.  Resume your normal diet as instructed by your health care provider. Avoid heavy or fried foods that are hard to digest.   Activity  Rest as told by your health care provider.  Avoid sitting for a long time without moving. Get up to take short walks every 1-2 hours. This is important to improve blood flow and breathing. Ask for help if you feel weak or unsteady.  Return to your normal activities as told by your health care provider. Ask your health care provider what activities are safe for you. Managing cramping and bloating  Try walking around when you have cramps or feel bloated.  Apply heat to your abdomen as told by your health care provider. Use the heat source that your health care provider recommends, such as a moist heat pack or a heating pad. ? Place a towel between your skin and the heat source. ? Leave the heat on for 20-30 minutes. ? Remove the heat if your skin turns bright red. This is especially important if you  are unable to feel pain, heat, or cold. You may have a greater risk of getting burned.   General instructions  If you were given a sedative during the procedure, it can affect you for several hours. Do not drive or operate machinery until your health care provider says that it is safe.  For the first 24 hours after the procedure: ? Do not sign important documents. ? Do not drink alcohol. ? Do your regular daily activities at a slower pace than normal. ? Eat soft foods that are easy to digest.  Take over-the-counter and prescription medicines only as told by your health care provider.  Keep all follow-up visits as told by your health care provider. This is important. Contact a health care provider if:  You have  blood in your stool 2-3 days after the procedure. Get help right away if you have:  More than a small spotting of blood in your stool.  Large blood clots in your stool.  Swelling of your abdomen.  Nausea or vomiting.  A fever.  Increasing pain in your abdomen that is not relieved with medicine. Summary  After the procedure, it is common to have a small amount of blood in your stool. You may also have mild cramping and bloating of your abdomen.  If you were given a sedative during the procedure, it can affect you for several hours. Do not drive or operate machinery until your health care provider says that it is safe.  Get help right away if you have a lot of blood in your stool, nausea or vomiting, a fever, or increased pain in your abdomen. This information is not intended to replace advice given to you by your health care provider. Make sure you discuss any questions you have with your health care provider. Document Revised: 10/21/2019 Document Reviewed: 05/23/2019 Elsevier Patient Education  2021 [place] After This sheet gives you information about how to care for yourself after your procedure. Your health care provider may also give you more specific instructions. If you have problems or questions, contact your health care provider. What can I expect after the procedure? After the procedure, it is common to have:  Tiredness.  Forgetfulness about what happened after the procedure.  Impaired judgment for important decisions.  Nausea or vomiting.  Some difficulty with balance. Follow these instructions at home: For the time period you were told by your health care provider:  Rest as needed.  Do not participate in activities where you could fall or become injured.  Do not drive or use machinery.  Do not drink alcohol.  Do not take sleeping pills or medicines that cause drowsiness.  Do not make important decisions or sign legal  documents.  Do not take care of children on your own.      Eating and drinking  Follow the diet that is recommended by your health care provider.  Drink enough fluid to keep your urine pale yellow.  If you vomit: ? Drink water, juice, or soup when you can drink without vomiting. ? Make sure you have little or no nausea before eating solid foods. General instructions  Have a responsible adult stay with you for the time you are told. It is important to have someone help care for you until you are awake and alert.  Take over-the-counter and prescription medicines only as told by your health care provider.  If you have sleep apnea, surgery and certain medicines can increase your risk  for breathing problems. Follow instructions from your health care provider about wearing your sleep device: ? Anytime you are sleeping, including during daytime naps. ? While taking prescription pain medicines, sleeping medicines, or medicines that make you drowsy.  Avoid smoking.  Keep all follow-up visits as told by your health care provider. This is important. Contact a health care provider if:  You keep feeling nauseous or you keep vomiting.  You feel light-headed.  You are still sleepy or having trouble with balance after 24 hours.  You develop a rash.  You have a fever.  You have redness or swelling around the IV site. Get help right away if:  You have trouble breathing.  You have new-onset confusion at home. Summary  For several hours after your procedure, you may feel tired. You may also be forgetful and have poor judgment.  Have a responsible adult stay with you for the time you are told. It is important to have someone help care for you until you are awake and alert.  Rest as told. Do not drive or operate machinery. Do not drink alcohol or take sleeping pills.  Get help right away if you have trouble breathing, or if you suddenly become confused. This information is not  intended to replace advice given to you by your health care provider. Make sure you discuss any questions you have with your health care provider. Document Revised: 07/12/2020 Document Reviewed: 09/29/2019 Elsevier Patient Education  2021 [employer].

## 2020-12-25 ENCOUNTER — Encounter (HOSPITAL_COMMUNITY)
Admission: RE | Admit: 2020-12-25 | Discharge: 2020-12-25 | Disposition: A | Discharge status: Home / Self Care | Payer: Medicare Other | Source: Ambulatory Visit | Attending: Internal Medicine | Admitting: Internal Medicine

## 2020-12-25 ENCOUNTER — Other Ambulatory Visit (HOSPITAL_COMMUNITY)
Admission: RE | Admit: 2020-12-25 | Discharge: 2020-12-25 | Disposition: A | Discharge status: Home / Self Care | Payer: Medicare Other | Source: Ambulatory Visit | Attending: Internal Medicine | Admitting: Internal Medicine

## 2020-12-25 ENCOUNTER — Other Ambulatory Visit: Payer: Self-pay

## 2020-12-25 DIAGNOSIS — Z20822 Contact with and (suspected) exposure to covid-19: Secondary | ICD-10-CM | POA: Diagnosis not present

## 2020-12-25 DIAGNOSIS — Z01812 Encounter for preprocedural laboratory examination: Secondary | ICD-10-CM | POA: Diagnosis not present

## 2020-12-25 LAB — SARS CORONAVIRUS 2 (TAT 6-24 HRS): SARS Coronavirus 2: NEGATIVE

## 2020-12-27 ENCOUNTER — Encounter (HOSPITAL_COMMUNITY)
Admission: RE | Disposition: A | Discharge status: Home / Self Care | Payer: Self-pay | Source: Home / Self Care | Attending: Internal Medicine

## 2020-12-27 ENCOUNTER — Ambulatory Visit (HOSPITAL_COMMUNITY): Payer: Medicare Other | Admitting: Anesthesiology

## 2020-12-27 ENCOUNTER — Encounter (HOSPITAL_COMMUNITY): Payer: Self-pay | Admitting: Internal Medicine

## 2020-12-27 ENCOUNTER — Ambulatory Visit (HOSPITAL_COMMUNITY)
Admission: RE | Admit: 2020-12-27 | Discharge: 2020-12-27 | Disposition: A | Discharge status: Home / Self Care | Payer: Medicare Other | Attending: Internal Medicine | Admitting: Internal Medicine

## 2020-12-27 DIAGNOSIS — Z86711 Personal history of pulmonary embolism: Secondary | ICD-10-CM | POA: Insufficient documentation

## 2020-12-27 DIAGNOSIS — Z7982 Long term (current) use of aspirin: Secondary | ICD-10-CM | POA: Insufficient documentation

## 2020-12-27 DIAGNOSIS — Z86010 Personal history of colonic polyps: Secondary | ICD-10-CM | POA: Insufficient documentation

## 2020-12-27 DIAGNOSIS — Z79899 Other long term (current) drug therapy: Secondary | ICD-10-CM | POA: Diagnosis not present

## 2020-12-27 DIAGNOSIS — D123 Benign neoplasm of transverse colon: Secondary | ICD-10-CM | POA: Diagnosis not present

## 2020-12-27 DIAGNOSIS — Z1211 Encounter for screening for malignant neoplasm of colon: Secondary | ICD-10-CM | POA: Insufficient documentation

## 2020-12-27 DIAGNOSIS — Z87891 Personal history of nicotine dependence: Secondary | ICD-10-CM | POA: Insufficient documentation

## 2020-12-27 DIAGNOSIS — Z7984 Long term (current) use of oral hypoglycemic drugs: Secondary | ICD-10-CM | POA: Insufficient documentation

## 2020-12-27 DIAGNOSIS — Z794 Long term (current) use of insulin: Secondary | ICD-10-CM | POA: Insufficient documentation

## 2020-12-27 DIAGNOSIS — K635 Polyp of colon: Secondary | ICD-10-CM

## 2020-12-27 DIAGNOSIS — K5730 Diverticulosis of large intestine without perforation or abscess without bleeding: Secondary | ICD-10-CM | POA: Diagnosis not present

## 2020-12-27 DIAGNOSIS — Z791 Long term (current) use of non-steroidal anti-inflammatories (NSAID): Secondary | ICD-10-CM | POA: Insufficient documentation

## 2020-12-27 HISTORY — PX: POLYPECTOMY: SHX5525

## 2020-12-27 HISTORY — PX: COLONOSCOPY WITH PROPOFOL: SHX5780

## 2020-12-27 LAB — GLUCOSE, CAPILLARY
Glucose-Capillary: 99 mg/dL (ref 70–99)
Glucose-Capillary: 97 mg/dL (ref 70–99)

## 2020-12-27 SURGERY — COLONOSCOPY WITH PROPOFOL
Anesthesia: General

## 2020-12-27 MED ORDER — LACTATED RINGERS IV SOLN
INTRAVENOUS | Status: DC
  Administered 2020-12-27: 1000 mL via INTRAVENOUS

## 2020-12-27 MED ORDER — PROPOFOL 500 MG/50ML IV EMUL
INTRAVENOUS | Status: DC | PRN
  Administered 2020-12-27: 100 ug/kg/min via INTRAVENOUS

## 2020-12-27 MED ORDER — PROPOFOL 10 MG/ML IV BOLUS
INTRAVENOUS | Status: DC | PRN
  Administered 2020-12-27: 40 mg via INTRAVENOUS
  Administered 2020-12-27: 100 mg via INTRAVENOUS

## 2020-12-27 NOTE — Discharge Instructions (Signed)
Colonoscopy Discharge Instructions  Read the instructions outlined below and refer to this sheet in the next few weeks. These discharge instructions provide you with general information on caring for yourself after you leave the hospital. Your doctor may also give you specific instructions. While your treatment has been planned according to the most current medical practices available, unavoidable complications occasionally occur. If you have any problems or questions after discharge, call Dr. Gala Romney at [phone]. ACTIVITY  You may resume your regular activity, but move at a slower pace for the next 24 hours.   Take frequent rest periods for the next 24 hours.   Walking will help get rid of the air and reduce the bloated feeling in your belly (abdomen).   No driving for 24 hours (because of the medicine (anesthesia) used during the test).    Do not sign any important legal documents or operate any machinery for 24 hours (because of the anesthesia used during the test).  NUTRITION  Drink plenty of fluids.   You may resume your normal diet as instructed by your doctor.   Begin with a light meal and progress to your normal diet. Heavy or fried foods are harder to digest and may make you feel sick to your stomach (nauseated).   Avoid alcoholic beverages for 24 hours or as instructed.  MEDICATIONS  You may resume your normal medications unless your doctor tells you otherwise.  WHAT YOU CAN EXPECT TODAY  Some feelings of bloating in the abdomen.   Passage of more gas than usual.   Spotting of blood in your stool or on the toilet paper.  IF YOU HAD POLYPS REMOVED DURING THE COLONOSCOPY:  No aspirin products for 7 days or as instructed.   No alcohol for 7 days or as instructed.   Eat a soft diet for the next 24 hours.  FINDING OUT THE RESULTS OF YOUR TEST Not all test results are available during your visit. If your test results are not back during the visit, make an appointment  with your caregiver to find out the results. Do not assume everything is normal if you have not heard from your caregiver or the medical facility. It is important for you to follow up on all of your test results.  SEEK IMMEDIATE MEDICAL ATTENTION IF:  You have more than a spotting of blood in your stool.   Your belly is swollen (abdominal distention).   You are nauseated or vomiting.   You have a temperature over 101.   You have abdominal pain or discomfort that is severe or gets worse throughout the day.   2 polyps removed in your colon today.  Diverticulosis and polyp information provided  Further recommendations to follow pending review of pathology report  Patient request, called Eh Sesay at [phone]  PATIENT INSTRUCTIONS POST-ANESTHESIA  IMMEDIATELY FOLLOWING SURGERY:  Do not drive or operate machinery for the first twenty four hours after surgery.  Do not make any important decisions for twenty four hours after surgery or while taking narcotic pain medications or sedatives.  If you develop intractable nausea and vomiting or a severe headache please notify your doctor immediately.  FOLLOW-UP:  Please make an appointment with your surgeon as instructed. You do not need to follow up with anesthesia unless specifically instructed to do so.  WOUND CARE INSTRUCTIONS (if applicable):  Keep a dry clean dressing on the anesthesia/puncture wound site if there is drainage.  Once the wound has quit draining you may leave it  open to air.  Generally you should leave the bandage intact for twenty four hours unless there is drainage.  If the epidural site drains for more than 36-48 hours please call the anesthesia department.  QUESTIONS?:  Please feel free to call your physician or the hospital operator if you have any questions, and they will be happy to assist you.      Colon Polyps  Colon polyps are tissue growths inside the colon, which is part of the large intestine. They are one  of the types of polyps that can grow in the body. A polyp may be a round bump or a mushroom-shaped growth. You could have one polyp or more than one. Most colon polyps are noncancerous (benign). However, some colon polyps can become cancerous over time. Finding and removing the polyps early can help prevent this. What are the causes? The exact cause of colon polyps is not known. What increases the risk? The following factors may make you more likely to develop this condition:  Having a family history of colorectal cancer or colon polyps.  Being older than 79 years of age.  Being younger than 79 years of age and having a significant family history of colorectal cancer or colon polyps or a genetic condition that puts you at higher risk of getting colon polyps.  Having inflammatory bowel disease, such as ulcerative colitis or Crohn's disease.  Having certain conditions passed from parent to child (hereditary conditions), such as: ? Familial adenomatous polyposis (FAP). ? Lynch syndrome. ? Turcot syndrome. ? Peutz-Jeghers syndrome. ? MUTYH-associated polyposis (MAP).  Being overweight.  Certain lifestyle factors. These include smoking cigarettes, drinking too much alcohol, not getting enough exercise, and eating a diet that is high in fat and red meat and low in fiber.  Having had childhood cancer that was treated with radiation of the abdomen. What are the signs or symptoms? Many times, there are no symptoms. If you have symptoms, they may include:  Blood coming from the rectum during a bowel movement.  Blood in the stool (feces). The blood may be bright red or very dark in color.  Pain in the abdomen.  A change in bowel habits, such as constipation or diarrhea. How is this diagnosed? This condition is diagnosed with a colonoscopy. This is a procedure in which a lighted, flexible scope is inserted into the opening between the buttocks (anus) and then passed into the colon to  examine the area. Polyps are sometimes found when a colonoscopy is done as part of routine cancer screening tests. How is this treated? This condition is treated by removing any polyps that are found. Most polyps can be removed during a colonoscopy. Those polyps will then be tested for cancer. Additional treatment may be needed depending on the results of testing. Follow these instructions at home: Eating and drinking  Eat foods that are high in fiber, such as fruits, vegetables, and whole grains.  Eat foods that are high in calcium and vitamin D, such as milk, cheese, yogurt, eggs, liver, fish, and broccoli.  Limit foods that are high in fat, such as fried foods and desserts.  Limit the amount of red meat, precooked or cured meat, or other processed meat that you eat, such as hot dogs, sausages, bacon, or meat loaves.  Limit sugary drinks.   Lifestyle  Maintain a healthy weight, or lose weight if recommended by your health care provider.  Exercise every day or as told by your health care provider.  Do  not use any products that contain nicotine or tobacco, such as cigarettes, e-cigarettes, and chewing tobacco. If you need help quitting, ask your health care provider.  Do not drink alcohol if: ? Your health care provider tells you not to drink. ? You are pregnant, may be pregnant, or are planning to become pregnant.  If you drink alcohol: ? Limit how much you use to:  0-1 drink a day for women.  0-2 drinks a day for men. ? Know how much alcohol is in your drink. In the [place] one drink equals one 12 oz bottle of beer (355 mL), one 5 oz glass of wine (148 mL), or one 1 oz glass of hard liquor (44 mL). General instructions  Take over-the-counter and prescription medicines only as told by your health care provider.  Keep all follow-up visits. This is important. This includes having regularly scheduled colonoscopies. Talk to your health care provider about when you need a  colonoscopy. Contact a health care provider if:  You have new or worsening bleeding during a bowel movement.  You have new or increased blood in your stool.  You have a change in bowel habits.  You lose weight for no known reason. Summary  Colon polyps are tissue growths inside the colon, which is part of the large intestine. They are one type of polyp that can grow in the body.  Most colon polyps are noncancerous (benign), but some can become cancerous over time.  This condition is diagnosed with a colonoscopy.  This condition is treated by removing any polyps that are found. Most polyps can be removed during a colonoscopy. This information is not intended to replace advice given to you by your health care provider. Make sure you discuss any questions you have with your health care provider. Document Revised: 02/15/2020 Document Reviewed: 02/15/2020 Elsevier Patient Education  2021 [place].   Diverticulosis  Diverticulosis is a condition that develops when small pouches (diverticula) form in the wall of the large intestine (colon). The colon is where water is absorbed and stool (feces) is formed. The pouches form when the inside layer of the colon pushes through weak spots in the outer layers of the colon. You may have a few pouches or many of them. The pouches usually do not cause problems unless they become inflamed or infected. When this happens, the condition is called diverticulitis. What are the causes? The cause of this condition is not known. What increases the risk? The following factors may make you more likely to develop this condition:  Being older than age 72. Your risk for this condition increases with age. Diverticulosis is rare among people younger than age 60. By age 81, many people have it.  Eating a low-fiber diet.  Having frequent constipation.  Being overweight.  Not getting enough exercise.  Smoking.  Taking over-the-counter pain medicines,  like aspirin and ibuprofen.  Having a family history of diverticulosis. What are the signs or symptoms? In most people, there are no symptoms of this condition. If you do have symptoms, they may include:  Bloating.  Cramps in the abdomen.  Constipation or diarrhea.  Pain in the lower left side of the abdomen. How is this diagnosed? Because diverticulosis usually has no symptoms, it is most often diagnosed during an exam for other colon problems. The condition may be diagnosed by:  Using a flexible scope to examine the colon (colonoscopy).  Taking an X-ray of the colon after dye has been put into the colon (  barium enema).  Having a CT scan. How is this treated? You may not need treatment for this condition. Your health care provider may recommend treatment to prevent problems. You may need treatment if you have symptoms or if you previously had diverticulitis. Treatment may include:  Eating a high-fiber diet.  Taking a fiber supplement.  Taking a live bacteria supplement (probiotic).  Taking medicine to relax your colon.   Follow these instructions at home: Medicines  Take over-the-counter and prescription medicines only as told by your health care provider.  If told by your health care provider, take a fiber supplement or probiotic. Constipation prevention Your condition may cause constipation. To prevent or treat constipation, you may need to:  Drink enough fluid to keep your urine pale yellow.  Take over-the-counter or prescription medicines.  Eat foods that are high in fiber, such as beans, whole grains, and fresh fruits and vegetables.  Limit foods that are high in fat and processed sugars, such as fried or sweet foods.   General instructions  Try not to strain when you have a bowel movement.  Keep all follow-up visits as told by your health care provider. This is important. Contact a health care provider if you:  Have pain in your abdomen.  Have  bloating.  Have cramps.  Have not had a bowel movement in 3 days. Get help right away if:  Your pain gets worse.  Your bloating becomes very bad.  You have a fever or chills, and your symptoms suddenly get worse.  You vomit.  You have bowel movements that are bloody or black.  You have bleeding from your rectum. Summary  Diverticulosis is a condition that develops when small pouches (diverticula) form in the wall of the large intestine (colon).  You may have a few pouches or many of them.  This condition is most often diagnosed during an exam for other colon problems.  Treatment may include increasing the fiber in your diet, taking supplements, or taking medicines. This information is not intended to replace advice given to you by your health care provider. Make sure you discuss any questions you have with your health care provider. Document Revised: 05/26/2019 Document Reviewed: 05/26/2019 Elsevier Patient Education  [place].

## 2020-12-27 NOTE — Transfer of Care (Signed)
Immediate Anesthesia Transfer of Care Note  Patient: David Duarte  Procedure(s) Performed: COLONOSCOPY WITH PROPOFOL (N/A ) POLYPECTOMY  Patient Location: Short Stay  Anesthesia Type:General  Level of Consciousness: awake and patient cooperative  Airway & Oxygen Therapy: Patient Spontanous Breathing  Post-op Assessment: Report given to RN and Post -op Vital signs reviewed and stable  Post vital signs: Reviewed and stable  Last Vitals:  Vitals Value Taken Time  BP    Temp    Pulse    Resp    SpO2     See vital sign flow sheet Last Pain:  Vitals:   12/27/20 0802  TempSrc:   PainSc: 0-No pain      Patients Stated Pain Goal: 8 (59/09/31 1216)  Complications: No complications documented.

## 2020-12-27 NOTE — Anesthesia Preprocedure Evaluation (Signed)
Anesthesia Evaluation  Patient identified by MRN, date of birth, ID band Patient awake    Reviewed: Allergy & Precautions, NPO status , Patient's Chart, lab work & pertinent test results  History of Anesthesia Complications Negative for: history of anesthetic complications  Airway Mallampati: II  TM Distance: >3 FB Neck ROM: Full    Dental  (+) Edentulous Upper, Edentulous Lower   Pulmonary former smoker, PE   Pulmonary exam normal breath sounds clear to auscultation       Cardiovascular Exercise Tolerance: Good Normal cardiovascular exam Rhythm:Regular Rate:Normal     Neuro/Psych Anxiety negative neurological ROS     GI/Hepatic hiatal hernia, GERD  Medicated and Controlled,(+) Cirrhosis       ,   Endo/Other  diabetes, Well Controlled, Type 2, Oral Hypoglycemic Agents  Renal/GU negative Renal ROS     Musculoskeletal  (+) Arthritis ,   Abdominal   Peds  Hematology  (+) anemia ,   Anesthesia Other Findings Syncope   Reproductive/Obstetrics                            Anesthesia Physical Anesthesia Plan  ASA: III  Anesthesia Plan: General   Post-op Pain Management:    Induction: Intravenous  PONV Risk Score and Plan: TIVA  Airway Management Planned: Nasal Cannula and Natural Airway  Additional Equipment:   Intra-op Plan:   Post-operative Plan:   Informed Consent: I have reviewed the patients History and Physical, chart, labs and discussed the procedure including the risks, benefits and alternatives for the proposed anesthesia with the patient or authorized representative who has indicated his/her understanding and acceptance.       Plan Discussed with: CRNA and Surgeon  Anesthesia Plan Comments:        Anesthesia Quick Evaluation

## 2020-12-27 NOTE — Anesthesia Procedure Notes (Signed)
Date/Time: 12/27/2020 8:01 AM Performed by: Vista Deck, CRNA Pre-anesthesia Checklist: Patient identified, Emergency Drugs available, Suction available, Timeout performed and Patient being monitored Patient Re-evaluated:Patient Re-evaluated prior to induction Oxygen Delivery Method: Non-rebreather mask

## 2020-12-27 NOTE — Op Note (Signed)
[facility] Patient Name: David Duarte Procedure Date: 12/27/2020 7:45 AM MRN: [id number] Date of Birth: 01-22-1941 Attending MD: Norvel Richards , MD CSN: [id number] Age: 80 Admit Type: Outpatient Procedure:                Colonoscopy Indications:              High risk colon cancer surveillance: Personal                            history of colonic polyps Providers:                Norvel Richards, MD, Gwenlyn Fudge, RN, Aram Candela Referring MD:              Medicines:                Propofol per Anesthesia Complications:            No immediate complications. Estimated Blood Loss:     Estimated blood loss was minimal. Procedure:                Pre-Anesthesia Assessment:                           - Prior to the procedure, a History and Physical                            was performed, and patient medications and                            allergies were reviewed. The patient's tolerance of                            previous anesthesia was also reviewed. The risks                            and benefits of the procedure and the sedation                            options and risks were discussed with the patient.                            All questions were answered, and informed consent                            was obtained. Prior Anticoagulants: The patient has                            taken no previous anticoagulant or antiplatelet                            agents. ASA Grade Assessment: III - A patient with  severe systemic disease. After reviewing the risks                            and benefits, the patient was deemed in                            satisfactory condition to undergo the procedure.                           After obtaining informed consent, the colonoscope                            was passed under direct vision. Throughout the                            procedure, the patient's  blood pressure, pulse, and                            oxygen saturations were monitored continuously. The                            CF-HQ190L ([id number]) scope was introduced through                            the anus and advanced to the the cecum, identified                            by appendiceal orifice and ileocecal valve. The                            colonoscopy was performed without difficulty. The                            patient tolerated the procedure well. The quality                            of the bowel preparation was adequate. Scope In: 8:07:39 AM Scope Out: 8:34:33 AM Scope Withdrawal Time: 0 hours 18 minutes 15 seconds  Total Procedure Duration: 0 hours 26 minutes 54 seconds  Findings:      The perianal and digital rectal examinations were normal. Some viscous       air-fluid some formed stool throughout the colon which had to be       copiously washed and suctioned to gain a marginal prep.      Multiple medium-mouthed diverticula were found in the entire colon.      Two semi-pedunculated polyps were found in the splenic flexure. The       polyps were 5 to 6 mm in size. These polyps were removed with a cold       snare. Resection and retrieval were complete. Estimated blood loss was       minimal.      The exam was otherwise without abnormality on direct and retroflexion       views. Impression:               - Diverticulosis in  the entire examined colon.                           - Two 5 to 6 mm polyps at the splenic flexure,                            removed with a cold snare. Resected and retrieved.                           - The examination was otherwise normal on direct                            and retroflexion views. Moderate Sedation:      Moderate (conscious) sedation was personally administered by an       anesthesia professional. The following parameters were monitored: oxygen       saturation, heart rate, blood pressure, respiratory rate, EKG,  adequacy       of pulmonary ventilation, and response to care. Recommendation:           - Patient has a contact number available for                            emergencies. The signs and symptoms of potential                            delayed complications were discussed with the                            patient. Return to normal activities tomorrow.                            Written discharge instructions were provided to the                            patient.                           - Resume previous diet.                           - Continue present medications.                           - Repeat colonoscopy after studies are complete for                            surveillance based on pathology results.                           - Return to GI office (date not yet determined). Procedure Code(s):        --- Professional ---                           [phone], Colonoscopy, flexible; with removal of  tumor(s), polyp(s), or other lesion(s) by snare                            technique Diagnosis Code(s):        --- Professional ---                           Z86.010, Personal history of colonic polyps                           K63.5, Polyp of colon                           K57.30, Diverticulosis of large intestine without                            perforation or abscess without bleeding CPT copyright 2019 American Medical Association. All rights reserved. The codes documented in this report are preliminary and upon coder review may  be revised to meet current compliance requirements. [name]. Fareed Fung, MD Norvel Richards, MD 12/27/2020 8:43:30 AM This report has been signed electronically. Number of Addenda: 0
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
## 2020-12-27 NOTE — Anesthesia Postprocedure Evaluation (Signed)
Anesthesia Post Note  Patient: David Duarte  Procedure(s) Performed: COLONOSCOPY WITH PROPOFOL (N/A ) POLYPECTOMY  Patient location during evaluation: Short Stay Anesthesia Type: General Level of consciousness: awake and alert and patient cooperative Pain management: satisfactory to patient Vital Signs Assessment: post-procedure vital signs reviewed and stable Respiratory status: spontaneous breathing Cardiovascular status: stable Postop Assessment: no apparent nausea or vomiting Anesthetic complications: no   No complications documented.   Last Vitals:  Vitals:   12/27/20 0723 12/27/20 0840  BP: 135/87 125/80  Pulse: 70 77  Resp: 17 16  Temp: 37.1 C 36.4 C  SpO2: 96% 97%    Last Pain:  Vitals:   12/27/20 0840  TempSrc: Oral  PainSc:                  Drucie Opitz

## 2020-12-28 LAB — SURGICAL PATHOLOGY

## 2020-12-31 ENCOUNTER — Encounter: Payer: Self-pay | Admitting: Internal Medicine

## 2021-01-01 ENCOUNTER — Encounter (HOSPITAL_COMMUNITY): Payer: Self-pay | Admitting: Internal Medicine

## 2021-01-01 ENCOUNTER — Telehealth: Payer: Self-pay | Admitting: Internal Medicine

## 2021-01-01 NOTE — Telephone Encounter (Signed)
Recall mailed 

## 2021-01-01 NOTE — Telephone Encounter (Signed)
RECALL FOR ULTRASOUND 

## 2021-03-28 ENCOUNTER — Ambulatory Visit: Payer: Medicare Other | Admitting: Orthopaedic Surgery

## 2021-03-28 ENCOUNTER — Other Ambulatory Visit: Payer: Self-pay

## 2021-05-15 ENCOUNTER — Ambulatory Visit: Payer: No Typology Code available for payment source | Admitting: Gastroenterology

## 2021-05-15 ENCOUNTER — Encounter: Payer: Self-pay | Admitting: Internal Medicine

## 2021-07-18 ENCOUNTER — Other Ambulatory Visit: Payer: Self-pay | Admitting: Gastroenterology

## 2021-09-19 DIAGNOSIS — I7143 Infrarenal abdominal aortic aneurysm, without rupture: Secondary | ICD-10-CM | POA: Insufficient documentation

## 2021-11-20 DIAGNOSIS — I480 Paroxysmal atrial fibrillation: Secondary | ICD-10-CM | POA: Insufficient documentation

## 2021-11-20 DIAGNOSIS — Z7901 Long term (current) use of anticoagulants: Secondary | ICD-10-CM | POA: Insufficient documentation

## 2022-02-05 ENCOUNTER — Other Ambulatory Visit: Payer: Self-pay

## 2022-02-05 ENCOUNTER — Ambulatory Visit (INDEPENDENT_AMBULATORY_CARE_PROVIDER_SITE_OTHER): Payer: No Typology Code available for payment source | Admitting: Orthopaedic Surgery

## 2022-02-05 ENCOUNTER — Ambulatory Visit: Payer: Self-pay

## 2022-02-05 ENCOUNTER — Encounter: Payer: Self-pay | Admitting: Orthopaedic Surgery

## 2022-02-05 VITALS — BP 126/64 | HR 62 | Ht 72.0 in | Wt 238.0 lb

## 2022-02-05 DIAGNOSIS — G8929 Other chronic pain: Secondary | ICD-10-CM | POA: Diagnosis not present

## 2022-02-05 DIAGNOSIS — M545 Low back pain, unspecified: Secondary | ICD-10-CM
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
## 2022-02-22 ENCOUNTER — Ambulatory Visit
Admission: RE | Admit: 2022-02-22 | Discharge: 2022-02-22 | Disposition: A | Discharge status: Home / Self Care | Payer: Medicare Other | Source: Ambulatory Visit | Attending: Orthopaedic Surgery | Admitting: Orthopaedic Surgery

## 2022-02-22 DIAGNOSIS — M545 Low back pain, unspecified: Secondary | ICD-10-CM

## 2022-02-24 ENCOUNTER — Telehealth: Payer: Self-pay

## 2022-02-24 NOTE — Telephone Encounter (Signed)
Please see message below. Radiology report suggests referral to Vascular. ?

## 2022-02-24 NOTE — Telephone Encounter (Signed)
FYI- ? ?MRI results for L-Spine are in patient's chart. ?Thank you. ?

## 2022-02-26 ENCOUNTER — Ambulatory Visit (INDEPENDENT_AMBULATORY_CARE_PROVIDER_SITE_OTHER): Payer: No Typology Code available for payment source | Admitting: Orthopaedic Surgery

## 2022-02-26 ENCOUNTER — Encounter: Payer: Self-pay | Admitting: Orthopaedic Surgery

## 2022-02-26 VITALS — BP 144/54 | HR 78 | Ht 72.0 in | Wt 238.0 lb

## 2022-02-26 DIAGNOSIS — M48062 Spinal stenosis, lumbar region with neurogenic claudication: Secondary | ICD-10-CM | POA: Diagnosis not present

## 2022-02-26 DIAGNOSIS — G8929 Other chronic pain: Secondary | ICD-10-CM | POA: Diagnosis not present

## 2022-02-26 DIAGNOSIS — M545 Low back pain, unspecified: Secondary | ICD-10-CM | POA: Diagnosis not present

## 2022-02-26 NOTE — Progress Notes (Signed)
? ?Office Visit Note ?  ?Patient: David Duarte           ?Date of Birth: Jun 10, 1941           ?MRN: [id number] ?Visit Date: 02/26/2022 ?             ?Requested by: Redmond School, MD ?[address] ?[place],  [place] [address] ?PCP: Redmond School, MD ? ? ?Assessment & Plan: ?Visit Diagnoses:  ?1. Chronic bilateral low back pain, unspecified whether sciatica present   ?2. Spinal stenosis of lumbar region with neurogenic claudication   ? ? ?Plan: We will try a single lumbar epidural see if this gives him some relief.  If not we discussed lumbar decompression surgery would be needed at L3-4 and L4-5.  He can follow-up with me after the epidural. ? ?Follow-Up Instructions: Return in about 5 weeks (around 04/02/2022).  ? ?Orders:  ?Orders Placed This Encounter  ?Procedures  ? Ambulatory referral to Physical Medicine Rehab  ? ?No orders of the defined types were placed in this encounter. ? ? ? ? Procedures: ?No procedures performed ? ? ?Clinical Data: ?No additional findings. ? ? ?Subjective: ?Chief Complaint  ?Patient presents with  ? Lower Back - Follow-up  ?  MRI lumbar review  ? ? ?HPI 81 year old male returns with progressive back pain neurogenic claudication symptoms.  He is worked in gradual losing weight BMI down to 32.  He does have diabetes on metformin.  He has to lean over a grocery cart has pain with standing he can only walk 1 block.  He gets relief if he sits and then can get up and repeat.  MRI scan has been obtained.  Patient's past smoker.  Patient had previous cardiac imaging showed 68% ejection fraction 2013 with some fixed defects on cardiac imaging.  No current chest pain problems. ? ?MRI scan showed previous aneurysms which have been treated once in [facility] with endovascular stenting of common iliacs and also abdominal aortic aneurysms.  He had broad-based disc bulge at L3-4 with lumbar spinal stenosis at that level.  There is also moderate multifactorial stenosis at L4-5 level. ? ?Plain  previous plain radiograph showed a coil present as well as the multiple stents mentioned above.  No spondylolisthesis noted on lumbar imaging. ? ?Review of Systems updated unchanged. ? ? ?Objective: ?Vital Signs: BP (!) 144/54   Pulse 78   Ht 6' (1.829 m)   Wt 238 lb (108 kg)   BMI 32.28 kg/m?  ? ?Physical Exam ?Constitutional:   ?   Appearance: He is well-developed.  ?HENT:  ?   Head: Normocephalic and atraumatic.  ?   Right Ear: External ear normal.  ?   Left Ear: External ear normal.  ?Eyes:  ?   Pupils: Pupils are equal, round, and reactive to light.  ?Neck:  ?   Thyroid: No thyromegaly.  ?   Trachea: No tracheal deviation.  ?Cardiovascular:  ?   Rate and Rhythm: Normal rate.  ?Pulmonary:  ?   Effort: Pulmonary effort is normal.  ?   Breath sounds: No wheezing.  ?Abdominal:  ?   General: Bowel sounds are normal.  ?   Palpations: Abdomen is soft.  ?Musculoskeletal:  ?   Cervical back: Neck supple.  ?Skin: ?   General: Skin is warm and dry.  ?   Capillary Refill: Capillary refill takes less than 2 seconds.  ?Neurological:  ?   Mental Status: He is alert and oriented to person, place,  and time.  ?Psychiatric:     ?   Behavior: Behavior normal.     ?   Thought Content: Thought content normal.     ?   Judgment: Judgment normal.  ? ? ?Ortho Exam patient has palpable pedal pulses negative logroll hips knees reach full extension.  Anterior tib EHL gastrocsoleus is strong normal heel toe gait. ? ?Specialty Comments:  ?MRI LUMBAR SPINE WITHOUT CONTRAST ?  ?TECHNIQUE: ?Multiplanar, multisequence MR imaging of the lumbar spine was ?performed. No intravenous contrast was administered. ?  ?COMPARISON:  Lumbar x-ray 02/05/2022, CT angio chest abdomen pelvis ?09/02/2017 ?  ?FINDINGS: ?Segmentation:  Standard. ?  ?Alignment:  Physiologic. ?  ?Vertebrae: No acute fracture, evidence of discitis, or aggressive ?bone lesion. ?  ?Conus medullaris and cauda equina: Conus extends to the T12-L1 ?level. Conus and cauda equina appear  normal. ?  ?Paraspinal and other soft tissues: No acute paraspinal abnormality. 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 Left common iliac ?artery aneurysm measuring at least 2.9 cm. Partially visualized ?infrarenal abdominal aortic aneurysm just above the bifurcation ?measuring over 6 cm. ?  ?Disc levels: ?  ?Disc spaces: Disc desiccation throughout the lumbar spine. ?  ?T12-L1: No significant disc bulge. No neural foraminal stenosis. No ?central canal stenosis. ?  ?L1-L2: No significant disc bulge. No neural foraminal stenosis. No ?central canal stenosis. Mild bilateral facet arthropathy. ?  ?L2-L3: Broad-based disc bulge. Moderate bilateral facet arthropathy. ?No foraminal or central stenosis. Right lateral recess stenosis. ?  ?L3-L4: Broad-based disc bulge flattening the ventral thecal sac with ?a small central disc protrusion. Moderate bilateral facet ?arthropathy with ligamentum flavum infolding. Moderate spinal ?stenosis. Mild left foraminal stenosis. No right foraminal stenosis. ?Bilateral subarticular recess stenosis. ?  ?L4-L5: Mild broad-based disc bulge. Moderate bilateral facet ?arthropathy with bilateral facet effusions. Bilateral subarticular ?recess stenosis. No foraminal stenosis. Moderate spinal stenosis. ?  ?L5-S1: No significant disc bulge. No neural foraminal stenosis. No ?central canal stenosis. Moderate right and mild left facet ?arthropathy. ?  ?IMPRESSION: ?1. At L3-4 there is a broad-based disc bulge flattening the ventral ?thecal sac with a small central disc protrusion. Moderate bilateral ?facet arthropathy with ligamentum flavum infolding. Moderate spinal ?stenosis. Mild left foraminal stenosis. No right foraminal stenosis. ?Bilateral subarticular recess stenosis. ?2. At L4-5 there is a mild broad-based disc bulge. Moderate ?bilateral facet arthropathy with bilateral facet effusions. ?Bilateral subarticular recess stenosis. No foraminal stenosis. ?Moderate spinal stenosis. ?3.  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 ?4. Partially visualized infrarenal abdominal aortic aneurysm just ?above the bifurcation measuring over 6 cm. Recommend further ?characterization with a CT angiogram of the abdomen and pelvis. ?Recommend referral to vascular surgery. This recommendation follows ?ACR consensus guidelines: White Paper of the ACR Incidental Findings ?Committee II on Vascular Findings. J Am Coll Radiol 2013; ?16:967-893. ?  ?  ?Electronically Signed ?  By: Kathreen Devoid M.D. ?  On: 02/23/2022 11:48 ? ?Imaging: ?CLINICAL DATA:  Low back pain for a year. ?  ?EXAM: ?MRI LUMBAR SPINE WITHOUT CONTRAST ?  ?TECHNIQUE: ?Multiplanar, multisequence MR imaging of the lumbar spine was ?performed. No intravenous contrast was administered. ?  ?COMPARISON:  Lumbar x-ray 02/05/2022, CT angio chest abdomen pelvis ?09/02/2017 ?  ?FINDINGS: ?Segmentation:  Standard. ?  ?Alignment:  Physiologic. ?  ?Vertebrae: No acute fracture, evidence of discitis, or aggressive ?bone lesion. ?  ?Conus medullaris and cauda equina: Conus extends to the T12-L1 ?level.  Conus and cauda equina appear normal. ?  ?Paraspinal and other soft tissues: No acute paraspinal abnormality. 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 Left common iliac ?artery aneurysm measuring at least 2.9 cm. Partially visualized ?infrarenal abdominal aortic aneurysm just above the bifurcation ?measuring over 6 cm. ?  ?Disc levels: ?  ?Disc spaces: Disc desiccation throughout the lumbar spine. ?  ?T12-L1: No significant disc bulge. No neural foraminal stenosis. No ?central canal stenosis. ?  ?L1-L2: No significant disc bulge. No neural foraminal stenosis. No ?central canal stenosis. Mild bilateral facet arthropathy. ?  ?L2-L3: Broad-based disc bulge. Moderate bilateral facet arthropathy. ?No foraminal or central stenosis. Right lateral recess stenosis. ?  ?L3-L4: Broad-based disc bulge  flattening the ventral thecal sac with ?a small central disc protrusion. Moderate bilateral facet ?arthropathy with ligamentum flavum infolding. Moderate spinal ?stenosis. Mild left foraminal stenosis. No ri

## 2022-02-26 NOTE — Telephone Encounter (Signed)
Patient has appt today. Will discuss at appt. ?

## 2022-02-27 DIAGNOSIS — M48061 Spinal stenosis, lumbar region without neurogenic claudication: Secondary | ICD-10-CM | POA: Insufficient documentation

## 2022-04-01 ENCOUNTER — Ambulatory Visit (INDEPENDENT_AMBULATORY_CARE_PROVIDER_SITE_OTHER): Payer: No Typology Code available for payment source | Admitting: Physical Medicine and Rehabilitation

## 2022-04-01 ENCOUNTER — Ambulatory Visit: Payer: Self-pay

## 2022-04-01 ENCOUNTER — Encounter: Payer: Self-pay | Admitting: Physical Medicine and Rehabilitation

## 2022-04-01 VITALS — BP 148/80 | HR 62

## 2022-04-01 DIAGNOSIS — M5416 Radiculopathy, lumbar region: Secondary | ICD-10-CM | POA: Diagnosis not present

## 2022-04-01 MED ORDER — METHYLPREDNISOLONE ACETATE 80 MG/ML IJ SUSP
80.0000 mg | Freq: Once | INTRAMUSCULAR | Status: AC
  Administered 2022-04-01: 80 mg

## 2022-04-01 NOTE — Patient Instructions (Signed)

## 2022-04-01 NOTE — Progress Notes (Signed)
Pt state lower back pain that travels down both legs. Pt state walking and twisting makes the pain worse. Pt state he takes over the counter pain meds to help ease his pain.  Numeric Pain Rating Scale and Functional Assessment Average Pain 5   In the last MONTH (on 0-10 scale) has pain interfered with the following?  1. General activity like being  able to carry out your everyday physical activities such as walking, climbing stairs, carrying groceries, or moving a chair?  Rating(10)   +Driver, +BT, -Dye Allergies.

## 2022-04-09 NOTE — Procedures (Signed)
Lumbosacral Transforaminal Epidural Steroid Injection - Sub-Pedicular Approach with Fluoroscopic Guidance  Patient: David Duarte      Date of Birth: [birth year] MRN: [id number] PCP: Redmond School, MD      Visit Date: 04/01/2022   Universal Protocol:    Date/Time: 04/01/2022  Consent Given By: the patient  Position: PRONE  Additional Comments: Vital signs were monitored before and after the procedure. Patient was prepped and draped in the usual sterile fashion. The correct patient, procedure, and site was verified.   Injection Procedure Details:   Procedure diagnoses: Lumbar radiculopathy [M54.16]    Meds Administered:  Meds ordered this encounter  Medications   methylPREDNISolone acetate (DEPO-MEDROL) injection 80 mg    Laterality: Bilateral  Location/Site: L3  Needle:5.0 in., 22 ga.  Short bevel or Quincke spinal needle  Needle Placement: Transforaminal  Findings:    -Comments: Excellent flow of contrast along the nerve, nerve root and into the epidural space.  Procedure Details: After squaring off the end-plates to get a true AP view, the C-arm was positioned so that an oblique view of the foramen as noted above was visualized. The target area is just inferior to the "nose of the scotty dog" or sub pedicular. The soft tissues overlying this structure were infiltrated with 2-3 ml. of 1% Lidocaine without Epinephrine.  The spinal needle was inserted toward the target using a "trajectory" view along the fluoroscope beam.  Under AP and lateral visualization, the needle was advanced so it did not puncture dura and was located close the 6 O'Clock position of the pedical in AP tracterory. Biplanar projections were used to confirm position. Aspiration was confirmed to be negative for CSF and/or blood. A 1-2 ml. volume of Isovue-250 was injected and flow of contrast was noted at each level. Radiographs were obtained for documentation purposes.   After attaining the  desired flow of contrast documented above, a 0.5 to 1.0 ml test dose of 0.25% Marcaine was injected into each respective transforaminal space.  The patient was observed for 90 seconds post injection.  After no sensory deficits were reported, and normal lower extremity motor function was noted,   the above injectate was administered so that equal amounts of the injectate were placed at each foramen (level) into the transforaminal epidural space.   Additional Comments:  The patient tolerated the procedure well Dressing: 2 x 2 sterile gauze and Band-Aid    Post-procedure details: Patient was observed during the procedure. Post-procedure instructions were reviewed.  Patient left the clinic in stable condition.

## 2022-04-09 NOTE — Progress Notes (Signed)
David Duarte - 81 y.o. male MRN [id number]  Date of birth: October 09, 1941  Office Visit Note: Visit Date: 04/01/2022 PCP: Redmond School, MD Referred by: Redmond School, MD  Subjective: Chief Complaint  Patient presents with   Lower Back - Pain   Right Leg - Pain   Left Leg - Pain   HPI:  David Duarte is a 81 y.o. male who comes in today at the request of Dr. Rodell Perna for planned Bilateral L3-4 Lumbar Transforaminal epidural steroid injection with fluoroscopic guidance.  The patient has failed conservative care including home exercise, medications, time and activity modification.  This injection will be diagnostic and hopefully therapeutic.  Please see requesting physician notes for further details and justification.  ROS Otherwise per HPI.  Assessment & Plan: Visit Diagnoses:    ICD-10-CM   1. Lumbar radiculopathy  M54.16 XR C-ARM NO REPORT    Epidural Steroid injection    methylPREDNISolone acetate (DEPO-MEDROL) injection 80 mg      Plan: No additional findings.   Meds & Orders:  Meds ordered this encounter  Medications   methylPREDNISolone acetate (DEPO-MEDROL) injection 80 mg    Orders Placed This Encounter  Procedures   XR C-ARM NO REPORT   Epidural Steroid injection    Follow-up: Return if symptoms worsen or fail to improve.   Procedures: No procedures performed  Lumbosacral Transforaminal Epidural Steroid Injection - Sub-Pedicular Approach with Fluoroscopic Guidance  Patient: David Duarte      Date of Birth: Mar 16, 1941 MRN: [id number] PCP: Redmond School, MD      Visit Date: 04/01/2022   Universal Protocol:    Date/Time: 04/01/2022  Consent Given By: the patient  Position: PRONE  Additional Comments: Vital signs were monitored before and after the procedure. Patient was prepped and draped in the usual sterile fashion. The correct patient, procedure, and site was verified.   Injection Procedure Details:   Procedure diagnoses:  Lumbar radiculopathy [M54.16]    Meds Administered:  Meds ordered this encounter  Medications   methylPREDNISolone acetate (DEPO-MEDROL) injection 80 mg    Laterality: Bilateral  Location/Site: L3  Needle:5.0 in., 22 ga.  Short bevel or Quincke spinal needle  Needle Placement: Transforaminal  Findings:    -Comments: Excellent flow of contrast along the nerve, nerve root and into the epidural space.  Procedure Details: After squaring off the end-plates to get a true AP view, the C-arm was positioned so that an oblique view of the foramen as noted above was visualized. The target area is just inferior to the "nose of the scotty dog" or sub pedicular. The soft tissues overlying this structure were infiltrated with 2-3 ml. of 1% Lidocaine without Epinephrine.  The spinal needle was inserted toward the target using a "trajectory" view along the fluoroscope beam.  Under AP and lateral visualization, the needle was advanced so it did not puncture dura and was located close the 6 O'Clock position of the pedical in AP tracterory. Biplanar projections were used to confirm position. Aspiration was confirmed to be negative for CSF and/or blood. A 1-2 ml. volume of Isovue-250 was injected and flow of contrast was noted at each level. Radiographs were obtained for documentation purposes.   After attaining the desired flow of contrast documented above, a 0.5 to 1.0 ml test dose of 0.25% Marcaine was injected into each respective transforaminal space.  The patient was observed for 90 seconds post injection.  After no sensory deficits were reported, and normal lower extremity motor  function was noted,   the above injectate was administered so that equal amounts of the injectate were placed at each foramen (level) into the transforaminal epidural space.   Additional Comments:  The patient tolerated the procedure well Dressing: 2 x 2 sterile gauze and Band-Aid    Post-procedure details: Patient was  observed during the procedure. Post-procedure instructions were reviewed.  Patient left the clinic in stable condition.    Clinical History: MRI LUMBAR SPINE WITHOUT CONTRAST   TECHNIQUE: Multiplanar, multisequence MR imaging of the lumbar spine was performed. No intravenous contrast was administered.   COMPARISON:  Lumbar x-ray 02/05/2022, CT angio chest abdomen pelvis 09/02/2017   FINDINGS: Segmentation:  Standard.   Alignment:  Physiologic.   Vertebrae: No acute fracture, evidence of discitis, or aggressive bone lesion.   Conus medullaris and cauda equina: Conus extends to the T12-L1 level. Conus and cauda equina appear normal.   Paraspinal and other soft tissues: No acute paraspinal abnormality. 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 Left common iliac artery aneurysm measuring at least 2.9 cm. Partially visualized infrarenal abdominal aortic aneurysm just above the bifurcation measuring over 6 cm.   Disc levels:   Disc spaces: Disc desiccation throughout the lumbar spine.   T12-L1: No significant disc bulge. No neural foraminal stenosis. No central canal stenosis.   L1-L2: No significant disc bulge. No neural foraminal stenosis. No central canal stenosis. Mild bilateral facet arthropathy.   L2-L3: Broad-based disc bulge. Moderate bilateral facet arthropathy. No foraminal or central stenosis. Right lateral recess stenosis.   L3-L4: Broad-based disc bulge flattening the ventral thecal sac with a small central disc protrusion. Moderate bilateral facet arthropathy with ligamentum flavum infolding. Moderate spinal stenosis. Mild left foraminal stenosis. No right foraminal stenosis. Bilateral subarticular recess stenosis.   L4-L5: Mild broad-based disc bulge. Moderate bilateral facet arthropathy with bilateral facet effusions. Bilateral subarticular recess stenosis. No foraminal stenosis. Moderate spinal stenosis.   L5-S1: No significant disc bulge.  No neural foraminal stenosis. No central canal stenosis. Moderate right and mild left facet arthropathy.   IMPRESSION: 1. At L3-4 there is a broad-based disc bulge flattening the ventral thecal sac with a small central disc protrusion. Moderate bilateral facet arthropathy with ligamentum flavum infolding. Moderate spinal stenosis. Mild left foraminal stenosis. No right foraminal stenosis. Bilateral subarticular recess stenosis. 2. At L4-5 there is a mild broad-based disc bulge. Moderate bilateral facet arthropathy with bilateral facet effusions. Bilateral subarticular recess stenosis. No foraminal stenosis. Moderate spinal stenosis. 3. 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 4. Partially visualized infrarenal abdominal aortic aneurysm just above the bifurcation measuring over 6 cm. Recommend further characterization with a CT angiogram of the abdomen and pelvis. Recommend referral to vascular surgery. This recommendation follows ACR consensus guidelines: White Paper of the ACR Incidental Findings Committee II on Vascular Findings. J Am Coll Radiol 2013; 10:789-794.     Electronically Signed   By: Kathreen Devoid M.D.   On: 02/23/2022 11:48     Objective:  VS:  HT:    WT:   BMI:     BP:(!) 148/80  HR:62bpm  TEMP: ( )  RESP:  Physical Exam Vitals and nursing note reviewed.  Constitutional:      General: He is not in acute distress.    Appearance: Normal appearance. He is not ill-appearing.  HENT:     Head: Normocephalic and atraumatic.     Right Ear: External ear  normal.     Left Ear: External ear normal.     Nose: No congestion.  Eyes:     Extraocular Movements: Extraocular movements intact.  Cardiovascular:     Rate and Rhythm: Normal rate.     Pulses: Normal pulses.  Pulmonary:     Effort: Pulmonary effort is normal. No respiratory distress.  Abdominal:     General: There is no distension.      Palpations: Abdomen is soft.  Musculoskeletal:        General: No tenderness or signs of injury.     Cervical back: Neck supple.     Right lower leg: No edema.     Left lower leg: No edema.     Comments: Patient has good distal strength without clonus.  Skin:    Findings: No erythema or rash.  Neurological:     General: No focal deficit present.     Mental Status: He is alert and oriented to person, place, and time.     Sensory: No sensory deficit.     Motor: No weakness or abnormal muscle tone.     Coordination: Coordination normal.  Psychiatric:        Mood and Affect: Mood normal.        Behavior: Behavior normal.     Imaging: No results found.

## 2022-04-21 IMAGING — MR MR LUMBAR SPINE W/O CM
4 of 5 series · 25 of 48 positions shown · non-contrast
Comparison: Lumbar x-ray 02/05/2022, CT angio chest abdomen pelvis
09/02/2017

CLINICAL DATA: Low back pain for a year.

EXAM:
MRI LUMBAR SPINE WITHOUT CONTRAST
TECHNIQUE: Multiplanar, multisequence MR imaging of the lumbar spine was
performed. No intravenous contrast was administered.

[Series 3: T2 · sagittal · 4.0mm · 0.53mm/px · 6 of 15 slices shown (1 of 2)]
[im 1/15]
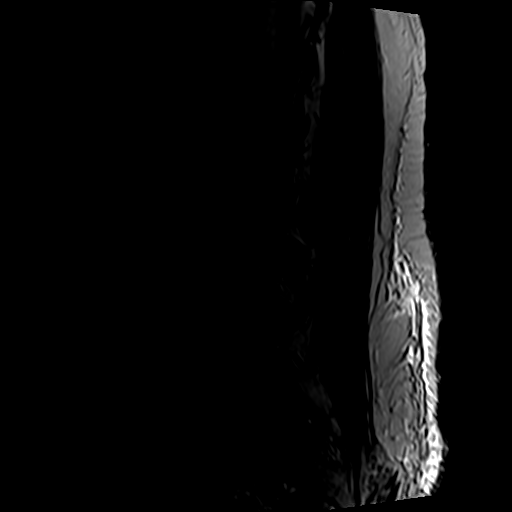
[im 3/15]
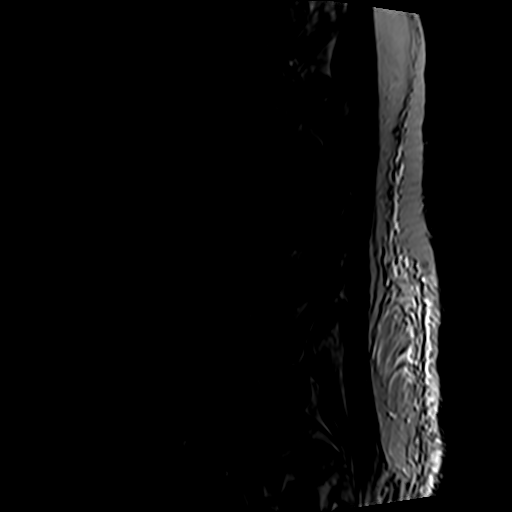
[im 6/15]
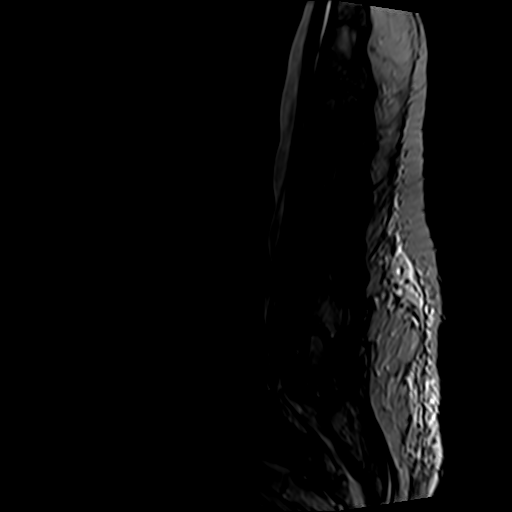
[im 9/15]
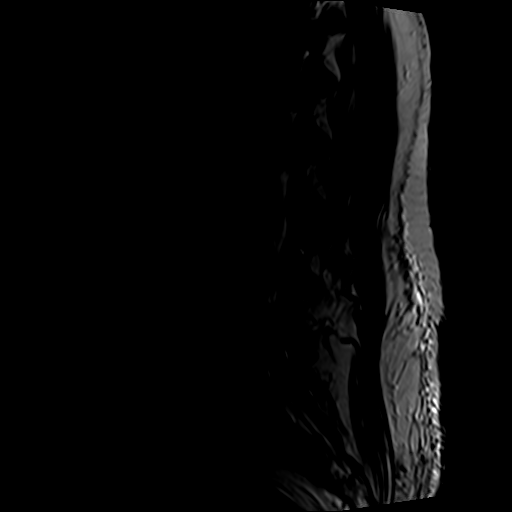
[im 12/15]
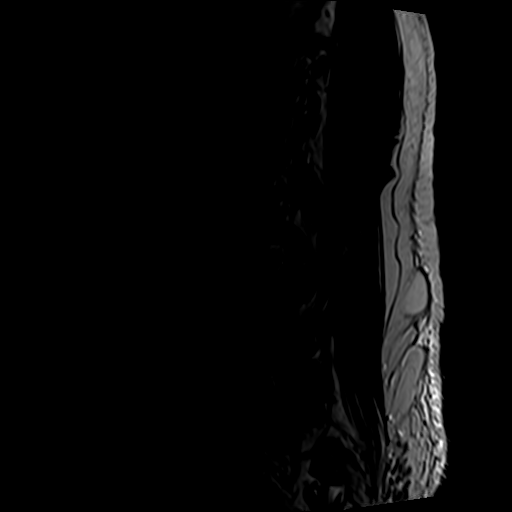
[im 15/15]
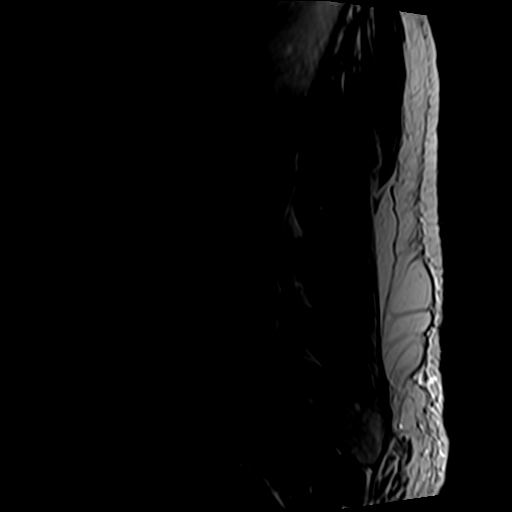

[Series 5: T1 · sagittal · 4.0mm · 0.53mm/px · 6 of 15 slices shown (1 of 2)]
[im 1/15]
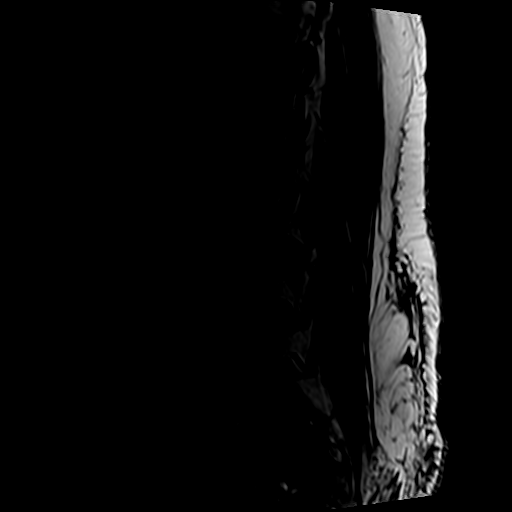
[im 3/15]
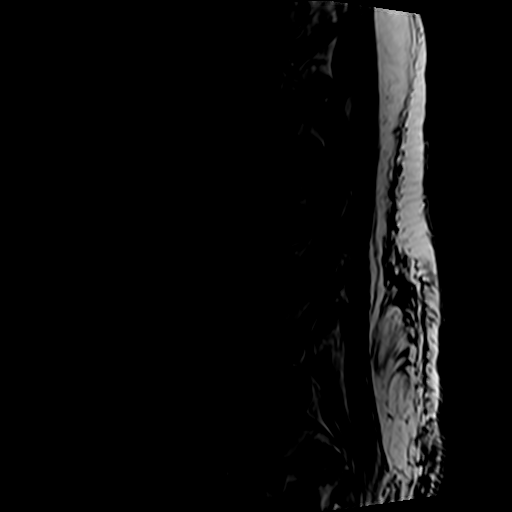
[im 6/15]
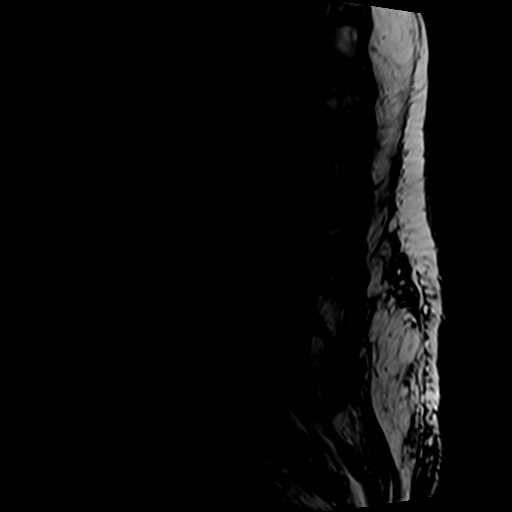
[im 9/15]
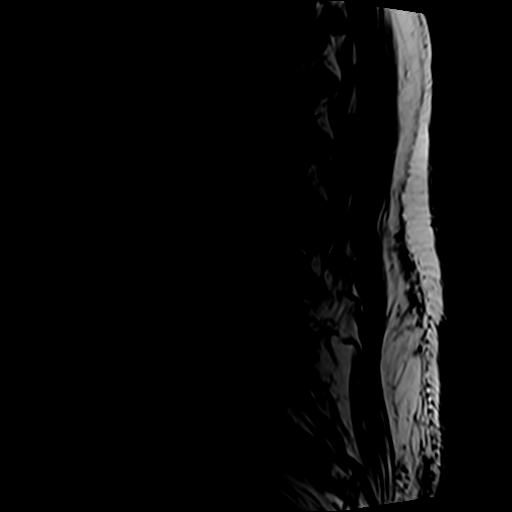
[im 12/15]
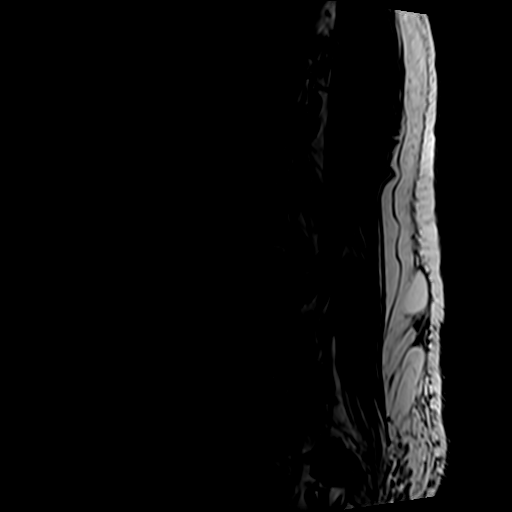
[im 15/15]
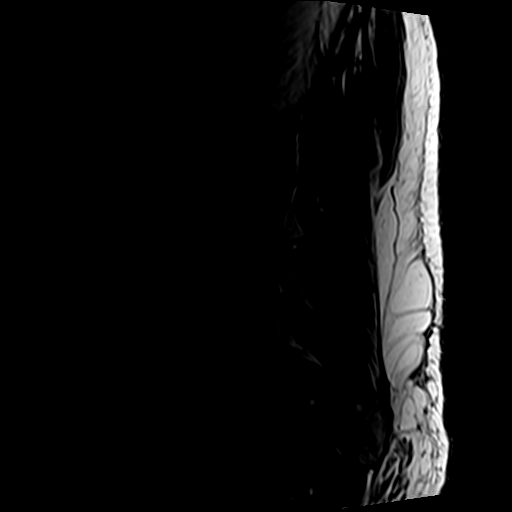

[Series 6: T2 · axial · 4.0mm · 0.70mm/px · z∈[-29,+198]mm · 9 of 41 slices shown (2 of 2)]
[im 1/41]
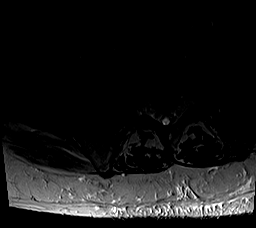
[im 6/41]
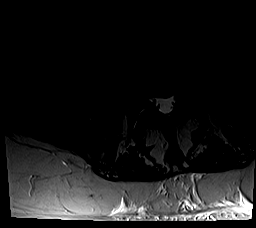
[im 12/41]
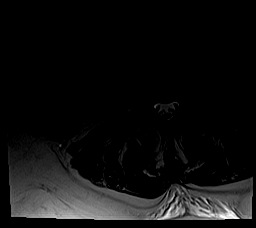
[im 18/41]
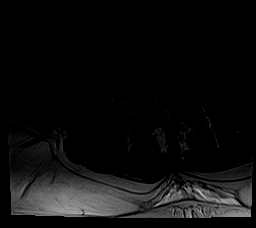
[im 21/41]
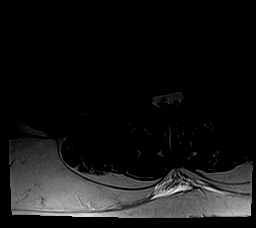
[im 23/41]
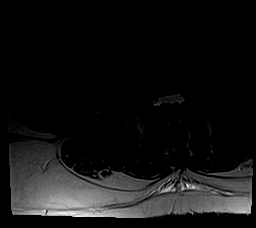
[im 29/41]
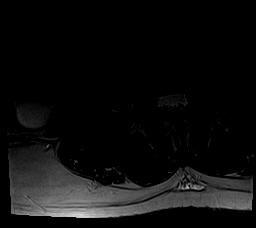
[im 35/41]
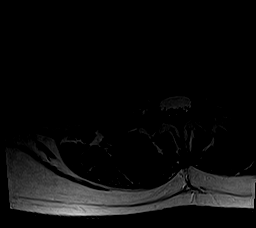
[im 41/41]
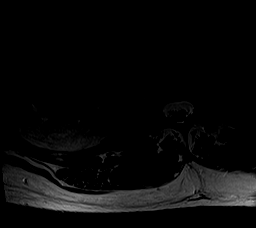

[Series 7: T1 · axial · 4.0mm · 0.35mm/px · z∈[-29,+166]mm · 4 of 41 slices shown (2 of 2)]
[im 1/41]
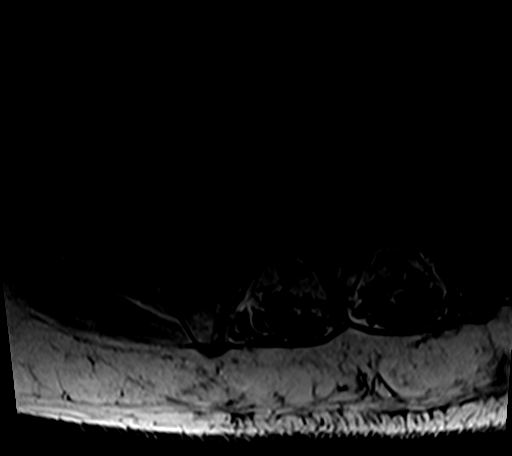
[im 6/41]
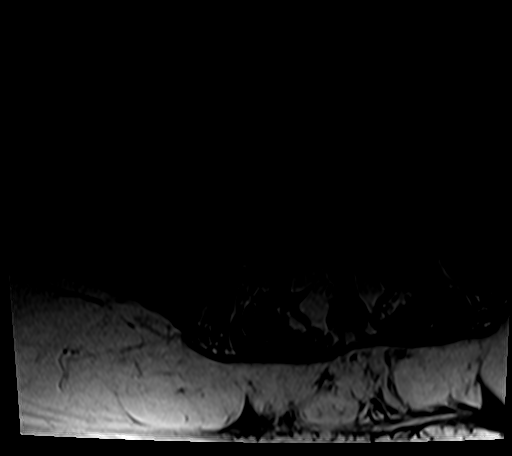
[im 21/41]
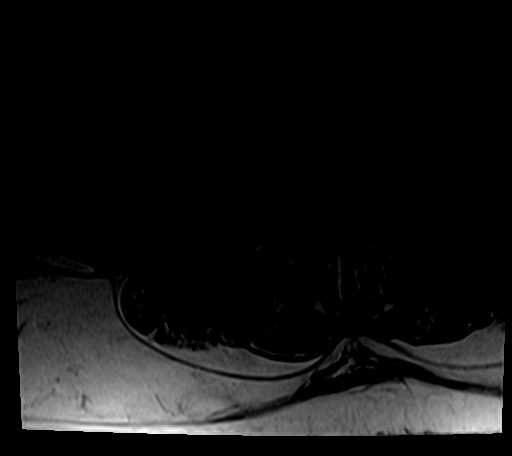
[im 35/41]
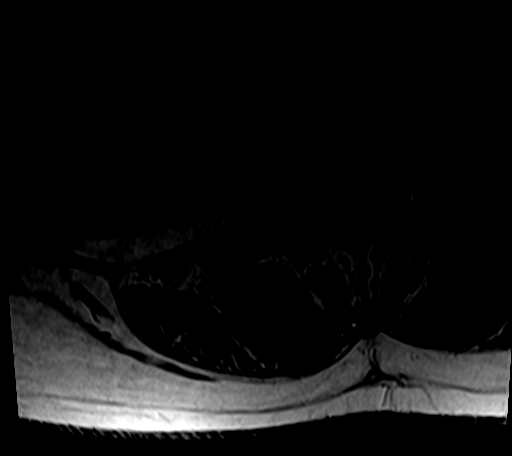

[25 of 48 positions shown; findings below may reference images not displayed]

FINDINGS: Segmentation:  Standard.

Alignment:  Physiologic.

Vertebrae: No acute fracture, evidence of discitis, or aggressive
bone lesion.

Conus medullaris and cauda equina: Conus extends to the T12-L1
level. Conus and cauda equina appear normal.

Paraspinal and other soft tissues: No acute paraspinal abnormality.
Right common iliac artery aneurysm measuring 3 cm. Left common iliac
artery aneurysm measuring at least 2.9 cm. Partially visualized
infrarenal abdominal aortic aneurysm just above the bifurcation
measuring over 6 cm.

Disc levels:

Disc spaces: Disc desiccation throughout the lumbar spine.

T12-L1: No significant disc bulge. No neural foraminal stenosis. No
central canal stenosis.

L1-L2: No significant disc bulge. No neural foraminal stenosis. No
central canal stenosis. Mild bilateral facet arthropathy.

L2-L3: Broad-based disc bulge. Moderate bilateral facet arthropathy.
No foraminal or central stenosis. Right lateral recess stenosis.

L3-L4: Broad-based disc bulge flattening the ventral thecal sac with
a small central disc protrusion. Moderate bilateral facet
arthropathy with ligamentum flavum infolding. Moderate spinal
stenosis. Mild left foraminal stenosis. No right foraminal stenosis.
Bilateral subarticular recess stenosis.

L4-L5: Mild broad-based disc bulge. Moderate bilateral facet
arthropathy with bilateral facet effusions. Bilateral subarticular
recess stenosis. No foraminal stenosis. Moderate spinal stenosis.

L5-S1: No significant disc bulge. No neural foraminal stenosis. No
central canal stenosis. Moderate right and mild left facet
arthropathy.
IMPRESSION: 1. At L3-4 there is a broad-based disc bulge flattening the ventral
thecal sac with a small central disc protrusion. Moderate bilateral
facet arthropathy with ligamentum flavum infolding. Moderate spinal
stenosis. Mild left foraminal stenosis. No right foraminal stenosis.
Bilateral subarticular recess stenosis.
2. At L4-5 there is a mild broad-based disc bulge. Moderate
bilateral facet arthropathy with bilateral facet effusions.
Bilateral subarticular recess stenosis. No foraminal stenosis.
Moderate spinal stenosis.
3. Partially visualized bilateral common iliac artery aneurysms
measuring 3 cm on the right and 2.9 cm on left.
4. Partially visualized infrarenal abdominal aortic aneurysm just
above the bifurcation measuring over 6 cm. Recommend further
characterization with a CT angiogram of the abdomen and pelvis.
Recommend referral to vascular surgery. This recommendation follows
ACR consensus guidelines: White Paper of the ACR Incidental Findings
Committee II on Vascular Findings. [HOSPITAL] [id number];

## 2022-04-24 ENCOUNTER — Encounter: Payer: Self-pay | Admitting: Orthopaedic Surgery

## 2022-04-24 ENCOUNTER — Ambulatory Visit (INDEPENDENT_AMBULATORY_CARE_PROVIDER_SITE_OTHER): Payer: No Typology Code available for payment source | Admitting: Orthopaedic Surgery

## 2022-04-24 VITALS — Ht 72.0 in | Wt 246.4 lb

## 2022-04-24 DIAGNOSIS — M48062 Spinal stenosis, lumbar region with neurogenic claudication: Secondary | ICD-10-CM | POA: Diagnosis not present

## 2022-04-30 ENCOUNTER — Encounter: Payer: Self-pay | Admitting: Gastroenterology

## 2022-04-30 ENCOUNTER — Ambulatory Visit (INDEPENDENT_AMBULATORY_CARE_PROVIDER_SITE_OTHER): Payer: No Typology Code available for payment source | Admitting: Gastroenterology

## 2022-04-30 ENCOUNTER — Telehealth: Payer: Self-pay

## 2022-04-30 VITALS — BP 164/69 | HR 87 | Temp 98.2°F | Ht 72.0 in | Wt 245.1 lb

## 2022-04-30 DIAGNOSIS — R112 Nausea with vomiting, unspecified: Secondary | ICD-10-CM

## 2022-04-30 DIAGNOSIS — R079 Chest pain, unspecified: Secondary | ICD-10-CM

## 2022-04-30 DIAGNOSIS — R1030 Lower abdominal pain, unspecified: Secondary | ICD-10-CM | POA: Diagnosis not present

## 2022-04-30 DIAGNOSIS — R0602 Shortness of breath: Secondary | ICD-10-CM

## 2022-04-30 DIAGNOSIS — K5900 Constipation, unspecified: Secondary | ICD-10-CM

## 2022-04-30 DIAGNOSIS — K7469 Other cirrhosis of liver: Secondary | ICD-10-CM

## 2022-04-30 MED ORDER — ONDANSETRON HCL 4 MG PO TABS: 4.0000 mg | ORAL_TABLET | Freq: Three times a day (TID) | ORAL | 1 refills | Status: DC

## 2022-04-30 NOTE — Telephone Encounter (Signed)
OLLIVANDER SEE (DOB Nov 17, 1940) [address]  [place] [id number]    CARDIAC CLEARANCE   April 30, 2022   Dear Dr. Redmond School,   The above patient will need Cardiac Clearance to have a EGD with dilation  ASA III with Dr Manus Rudd.  (Dx : N/V).  Will it be ok for this patient to hold his Eliquis 48  hrs prior to the procedure. The date is to be determined. The anesthesia to be used for the procedure is Propofol.      Thank you, Oleh Genin, CMA
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
## 2022-04-30 NOTE — Patient Instructions (Addendum)
We are referring you back to Cardiology due to chest pain with exertion and associated shortness of breath.  We will be arranging an upper endoscopy with dilation by Dr. Gala Romney once you are ok by Cardiology. No Januvia day of procedure. No Tresiba day of procedure.   We will have you stop Eliquis 2 days before the endoscopy, but we are making sure ok with your PCP first  We have ordered routine labs today. You will need a routine ultrasound in the future (of your liver).   For constipation: let's try samples of Linzess.  Start taking Linzess 1 capsule 30 minutes before breakfast daily. It is normal to have some looser stool starting out for the first few days, but this should improve. If it does not, please call us, as we will need to adjust the dosage.   Please let us know how Linzess works for you!  I sent in Zofran to take before meals for nausea. Stick with softer foods, smaller portions.    It was a pleasure to see you today. I want to create trusting relationships with patients to provide genuine, compassionate, and quality care. I value your feedback. If you receive a survey regarding your visit,  I greatly appreciate you taking time to fill this out.   Annitta Needs, PhD, ANP-BC [facility]enter[facility]

## 2022-05-02 LAB — CBC WITH DIFFERENTIAL/PLATELET
Absolute Monocytes: 1387 cells/uL — ABNORMAL HIGH (ref 200–950)
Basophils Absolute: 51 cells/uL (ref 0–200)
Basophils Relative: 0.7 %
Eosinophils Absolute: 88 cells/uL (ref 15–500)
Eosinophils Relative: 1.2 %
HCT: 26.6 % — ABNORMAL LOW (ref 38.5–50.0)
Hemoglobin: 8.6 g/dL — ABNORMAL LOW (ref 13.2–17.1)
Lymphs Abs: 1168 cells/uL (ref 850–3900)
MCH: 32.7 pg (ref 27.0–33.0)
MCHC: 32.3 g/dL (ref 32.0–36.0)
MCV: 101.1 fL — ABNORMAL HIGH (ref 80.0–100.0)
MPV: 12.9 fL — ABNORMAL HIGH (ref 7.5–12.5)
Monocytes Relative: 19 %
Neutro Abs: 4606 cells/uL (ref 1500–7800)
Neutrophils Relative %: 63.1 %
Platelets: 182 10*3/uL (ref 140–400)
RBC: 2.63 10*6/uL — ABNORMAL LOW (ref 4.20–5.80)
RDW: 15.7 % — ABNORMAL HIGH (ref 11.0–15.0)
Total Lymphocyte: 16 %
WBC: 7.3 10*3/uL (ref 3.8–10.8)

## 2022-05-02 LAB — PROTIME-INR
INR: 1
Prothrombin Time: 10.4 s (ref 9.0–11.5)

## 2022-05-02 LAB — COMPLETE METABOLIC PANEL WITH GFR
AG Ratio: 1.2 (calc) (ref 1.0–2.5)
ALT: 5 U/L — ABNORMAL LOW (ref 9–46)
AST: 11 U/L (ref 10–35)
Albumin: 4.2 g/dL (ref 3.6–5.1)
Alkaline phosphatase (APISO): 43 U/L (ref 35–144)
BUN: 20 mg/dL (ref 7–25)
CO2: 25 mmol/L (ref 20–32)
Calcium: 9.3 mg/dL (ref 8.6–10.3)
Chloride: 105 mmol/L (ref 98–110)
Creat: 1.18 mg/dL (ref 0.70–1.28)
Globulin: 3.4 g/dL (calc) (ref 1.9–3.7)
Glucose, Bld: 100 mg/dL — ABNORMAL HIGH (ref 65–99)
Potassium: 4.6 mmol/L (ref 3.5–5.3)
Sodium: 139 mmol/L (ref 135–146)
Total Bilirubin: 1.1 mg/dL (ref 0.2–1.2)
Total Protein: 7.6 g/dL (ref 6.1–8.1)
eGFR: 63 mL/min/{1.73_m2} (ref >=60)

## 2022-05-02 LAB — AFP TUMOR MARKER: AFP-Tumor Marker: 1.6 ng/mL (ref <6.1)

## 2022-05-06 ENCOUNTER — Other Ambulatory Visit: Payer: Self-pay

## 2022-05-06 DIAGNOSIS — D649 Anemia, unspecified: Secondary | ICD-10-CM

## 2022-05-06 DIAGNOSIS — K7460 Unspecified cirrhosis of liver: Secondary | ICD-10-CM

## 2022-05-06 DIAGNOSIS — R1319 Other dysphagia: Secondary | ICD-10-CM

## 2022-05-07 ENCOUNTER — Ambulatory Visit (HOSPITAL_COMMUNITY)
Admission: RE | Admit: 2022-05-07 | Discharge: 2022-05-07 | Disposition: A | Discharge status: Home / Self Care | Payer: Medicare Other | Source: Ambulatory Visit | Attending: Gastroenterology | Admitting: Gastroenterology

## 2022-05-07 DIAGNOSIS — K7469 Other cirrhosis of liver: Secondary | ICD-10-CM | POA: Insufficient documentation

## 2022-05-07 LAB — IRON,TIBC AND FERRITIN PANEL
%SAT: 16 % (calc) — ABNORMAL LOW (ref 20–48)
Ferritin: 64 ng/mL (ref 24–380)
Iron: 77 ug/dL (ref 50–180)
TIBC: 483 mcg/dL (calc) — ABNORMAL HIGH (ref 250–425)

## 2022-05-11 ENCOUNTER — Encounter: Payer: Self-pay | Admitting: Gastroenterology

## 2022-05-20 ENCOUNTER — Telehealth: Payer: Self-pay | Admitting: Gastroenterology

## 2022-05-20 ENCOUNTER — Encounter: Payer: Self-pay | Admitting: Gastroenterology

## 2022-05-20 NOTE — Telephone Encounter (Signed)
Per cardiology notes on 7/6 "7/6- called pt number no answer and would not allow you to leave a message.  The the recording would say "I'm sorry I can not hear you".  -sas "

## 2022-05-20 NOTE — Telephone Encounter (Signed)
What is status of cardiology referral? I would like for him to have an EGD/dilation as soon as possible.

## 2022-05-27 NOTE — Telephone Encounter (Signed)
Pt scheduled with cardiology  07/02/22

## 2022-07-02 ENCOUNTER — Ambulatory Visit (INDEPENDENT_AMBULATORY_CARE_PROVIDER_SITE_OTHER): Payer: No Typology Code available for payment source | Admitting: Cardiology

## 2022-07-02 ENCOUNTER — Encounter: Payer: Self-pay | Admitting: *Deleted

## 2022-07-02 ENCOUNTER — Encounter: Payer: Self-pay | Admitting: Cardiology

## 2022-07-02 ENCOUNTER — Telehealth: Payer: Self-pay | Admitting: Cardiology

## 2022-07-02 VITALS — BP 130/54 | HR 60 | Ht 72.0 in | Wt 251.6 lb

## 2022-07-02 DIAGNOSIS — R0609 Other forms of dyspnea: Secondary | ICD-10-CM | POA: Diagnosis not present

## 2022-07-02 DIAGNOSIS — E782 Mixed hyperlipidemia: Secondary | ICD-10-CM

## 2022-07-02 DIAGNOSIS — I739 Peripheral vascular disease, unspecified: Secondary | ICD-10-CM | POA: Diagnosis not present

## 2022-07-02 DIAGNOSIS — Z01818 Encounter for other preprocedural examination: Secondary | ICD-10-CM | POA: Diagnosis not present

## 2022-07-02 DIAGNOSIS — I480 Paroxysmal atrial fibrillation: Secondary | ICD-10-CM | POA: Diagnosis not present
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
## 2022-07-02 NOTE — Telephone Encounter (Signed)
Checking percert on the following patient for testing scheduled at [facility].     LEXISCAN   07/04/2022  ECHO 07/03/2022 [facility]

## 2022-07-02 NOTE — Patient Instructions (Addendum)
Medication Instructions:  Your physician recommends that you continue on your current medications as directed. Please refer to the Current Medication list given to you today.  Labwork: none  Testing/Procedures: Your physician has requested that you have an echocardiogram. Echocardiography is a painless test that uses sound waves to create images of your heart. It provides your doctor with information about the size and shape of your heart and how well your heart's chambers and valves are working. This procedure takes approximately one hour. There are no restrictions for this procedure. Your physician has requested that you have a lexiscan myoview. For further information please visit www.[email or web address]. Please follow instruction sheet, as given.  Follow-Up: Your physician recommends that you schedule a follow-up appointment in: pending  Any Other Special Instructions Will Be Listed Below (If Applicable).  If you need a refill on your cardiac medications before your next appointment, please call your pharmacy. 

## 2022-07-03 ENCOUNTER — Ambulatory Visit (INDEPENDENT_AMBULATORY_CARE_PROVIDER_SITE_OTHER): Payer: No Typology Code available for payment source

## 2022-07-03 DIAGNOSIS — R0609 Other forms of dyspnea: Secondary | ICD-10-CM | POA: Diagnosis not present

## 2022-07-03 DIAGNOSIS — Z01818 Encounter for other preprocedural examination: Secondary | ICD-10-CM | POA: Diagnosis not present

## 2022-07-03 LAB — ECHOCARDIOGRAM COMPLETE
AR max vel: 1.54 cm2
AV Area VTI: 1.45 cm2
AV Area mean vel: 1.53 cm2
AV Mean grad: 11.5 mmHg
AV Peak grad: 20.4 mmHg
Ao pk vel: 2.26 m/s
Area-P 1/2: 3.19 cm2
Calc EF: 60.6 %
MV M vel: 4.37 m/s
MV Peak grad: 76.5 mmHg
S' Lateral: 2.65 cm
Single Plane A2C EF: 58.7 %
Single Plane A4C EF: 60.4 %

## 2022-07-04 ENCOUNTER — Encounter (HOSPITAL_COMMUNITY)
Admission: RE | Admit: 2022-07-04 | Discharge: 2022-07-04 | Disposition: A | Discharge status: Home / Self Care | Payer: Medicare Other | Source: Ambulatory Visit | Attending: Cardiology | Admitting: Cardiology

## 2022-07-04 ENCOUNTER — Ambulatory Visit (HOSPITAL_COMMUNITY)
Admission: RE | Admit: 2022-07-04 | Discharge: 2022-07-04 | Disposition: A | Discharge status: Home / Self Care | Payer: Medicare Other | Source: Ambulatory Visit | Attending: Cardiology | Admitting: Cardiology

## 2022-07-04 ENCOUNTER — Encounter (HOSPITAL_COMMUNITY): Payer: Self-pay

## 2022-07-04 DIAGNOSIS — Z01818 Encounter for other preprocedural examination: Secondary | ICD-10-CM | POA: Insufficient documentation

## 2022-07-04 DIAGNOSIS — Z01810 Encounter for preprocedural cardiovascular examination: Secondary | ICD-10-CM

## 2022-07-04 DIAGNOSIS — R0609 Other forms of dyspnea: Secondary | ICD-10-CM | POA: Diagnosis not present

## 2022-07-04 LAB — NM MYOCAR MULTI W/SPECT W/WALL MOTION / EF
LV dias vol: 132 mL (ref 62–150)
LV sys vol: 70 mL
Nuc Stress EF: 47 %
Peak HR: 72 {beats}/min
RATE: 0.6
Rest HR: 60 {beats}/min
Rest Nuclear Isotope Dose: 11 mCi
SDS: 5
SRS: 2
SSS: 7
ST Depression (mm): 0 mm
Stress Nuclear Isotope Dose: 33 mCi
TID: 1.05

## 2022-07-04 MED ORDER — REGADENOSON 0.4 MG/5ML IV SOLN
INTRAVENOUS | Status: AC
  Administered 2022-07-04: 0.4 mg via INTRAVENOUS
  Filled 2022-07-04: qty 5

## 2022-07-04 MED ORDER — SODIUM CHLORIDE FLUSH 0.9 % IV SOLN
INTRAVENOUS | Status: AC
  Administered 2022-07-04: 10 mL via INTRAVENOUS
  Filled 2022-07-04: qty 10

## 2022-07-04 MED ORDER — TECHNETIUM TC 99M TETROFOSMIN IV KIT
10.0000 | PACK | Freq: Once | INTRAVENOUS | Status: AC | PRN
  Administered 2022-07-04: 11 via INTRAVENOUS

## 2022-07-04 MED ORDER — TECHNETIUM TC 99M TETROFOSMIN IV KIT
30.0000 | PACK | Freq: Once | INTRAVENOUS | Status: AC | PRN
  Administered 2022-07-04: 33 via INTRAVENOUS

## 2022-07-07 ENCOUNTER — Telehealth: Payer: Self-pay | Admitting: Cardiology

## 2022-07-07 ENCOUNTER — Other Ambulatory Visit: Payer: Self-pay | Admitting: *Deleted

## 2022-07-07 DIAGNOSIS — Z79899 Other long term (current) drug therapy: Secondary | ICD-10-CM

## 2022-07-07 DIAGNOSIS — R0609 Other forms of dyspnea: Secondary | ICD-10-CM

## 2022-07-07 MED ORDER — FUROSEMIDE 20 MG PO TABS: 20.0000 mg | ORAL_TABLET | Freq: Every day | ORAL | 1 refills | Status: DC

## 2022-07-07 NOTE — Telephone Encounter (Signed)
-----   Message from Satira Sark, MD sent at 07/04/2022  4:22 PM EDT ----- Results reviewed.  I also reviewed the study images myself.  Study is suggestive of underlying ischemic heart disease, probable inferior wall scar with mild peri-infarct ischemia, also mild apical anteroseptal ischemia.  Agree that this is intermediate risk, LVEF is normal by concurrent echocardiogram.  He has mild aortic stenosis which should not increase his risk.  I think he can proceed with an EGD as this is generally a low risk procedure.  He can be managed medically during this time, although I do think that we will need to consider a cardiac catheterization ultimately for diagnostic purposes and to make sure that we do not need to pursue any revascularization options.  Understanding his GI status first however would make sense prior to committing him to DAPT and potentially anticoagulation as well.  Please make sure that we have an office visit scheduled after his EGD, likely within a month.

## 2022-07-07 NOTE — Telephone Encounter (Signed)
Pt's wife returning nurses call regarding results. Call transferred

## 2022-07-07 NOTE — Telephone Encounter (Signed)
Patient and wife informed and verbalized understanding of plan. Lab order faxed to [facility]

## 2022-07-07 NOTE — Telephone Encounter (Signed)
-----   Message from Satira Sark, MD sent at 07/03/2022  1:06 PM EDT ----- Results reviewed.  Echocardiogram demonstrates normal LVEF at 60 to 65% with moderate diastolic dysfunction.  Does have moderately elevated estimated RVSP as well.  Aortic stenosis is mild and should not impact surgical risk.  Suggest starting low-dose diuretic to help decrease LV and RV pressures which may help with shortness of breath.  Start Lasix 20 mg daily, no potassium supplement as yet, recheck BMET in 2 weeks.  Follow-up on stress testing as ordered.

## 2022-07-08 NOTE — Telephone Encounter (Signed)
Mindy:  He has seen cardiology. He has mild aortic stenosis. May proceed with EGD. He is being medically managed right now with close follow-up by Cardiology.   Please arrange EGD/dilation with Dr. Gala Romney. ASA 3. Reason: dysphagia, unintentional weight loss. HOLD ELIQUIS 48 hours prior.   No Januvia day of procedure. 1/2 dose insulin day prior, no insulin morning of procedure.

## 2022-07-09 NOTE — Telephone Encounter (Signed)
Will call pt to schedule once we receive future schedule 

## 2022-07-16 ENCOUNTER — Telehealth: Payer: Self-pay | Admitting: Cardiology

## 2022-07-16 NOTE — Telephone Encounter (Signed)
Pt's wife came into the [place] office- states that when the pt takes his Eliquis he's getting sick and throwing up.   Would like to know if he can stop taking them?  Please call [phone]

## 2022-07-16 NOTE — Telephone Encounter (Signed)
Spoke with patient  since wife was still out running errands. Patient says he was concerned that blood look like water this morning when he checked his blood sugar. Denies nausea and vomiting recently. Says he's had n/v for some time. Reports SOB when walking to mailbox but has improve since staring furosemide. Denies chest pain or dizziness. Advised to have wife call office when she returns home to discuss further. Verbalized understanding.

## 2022-07-17 NOTE — Telephone Encounter (Signed)
Patient's wife is returning call. 

## 2022-07-17 NOTE — Telephone Encounter (Signed)
Spoke with wife and explained that patient did need to follow up with GI for N/V symptoms. Says she will contact GI provider today for an appointment. Also discussed conversation that was had with patient on yesterday. Verbalized understanding of plan.

## 2022-07-18 ENCOUNTER — Encounter: Payer: Self-pay | Admitting: *Deleted

## 2022-07-21 ENCOUNTER — Encounter: Payer: Self-pay | Admitting: *Deleted

## 2022-07-23 LAB — BASIC METABOLIC PANEL
BUN: 16 mg/dL (ref 7–25)
CO2: 28 mmol/L (ref 20–32)
Calcium: 9.1 mg/dL (ref 8.6–10.3)
Chloride: 103 mmol/L (ref 98–110)
Creat: 0.95 mg/dL (ref 0.70–1.28)
Glucose, Bld: 95 mg/dL (ref 65–99)
Potassium: 4.2 mmol/L (ref 3.5–5.3)
Sodium: 139 mmol/L (ref 135–146)

## 2022-08-05 ENCOUNTER — Other Ambulatory Visit: Payer: Self-pay | Admitting: Cardiology
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
## 2022-08-19 ENCOUNTER — Encounter: Payer: Self-pay | Admitting: Cardiology

## 2022-08-19 ENCOUNTER — Ambulatory Visit: Payer: Medicare Other | Attending: Cardiology | Admitting: Cardiology

## 2022-08-19 VITALS — BP 130/64 | HR 72 | Ht 72.0 in | Wt 248.6 lb

## 2022-08-19 DIAGNOSIS — R9439 Abnormal result of other cardiovascular function study: Secondary | ICD-10-CM | POA: Insufficient documentation

## 2022-08-19 DIAGNOSIS — I739 Peripheral vascular disease, unspecified: Secondary | ICD-10-CM | POA: Diagnosis present

## 2022-08-19 DIAGNOSIS — I480 Paroxysmal atrial fibrillation: Secondary | ICD-10-CM | POA: Insufficient documentation

## 2022-08-19 DIAGNOSIS — I259 Chronic ischemic heart disease, unspecified: Secondary | ICD-10-CM | POA: Insufficient documentation

## 2022-08-19 NOTE — Patient Instructions (Addendum)
Medication Instructions:  Your physician recommends that you continue on your current medications as directed. Please refer to the Current Medication list given to you today.  Labwork: none  Testing/Procedures: none  Follow-Up: Your physician recommends that you schedule a follow-up appointment in: 6 weeks  Any Other Special Instructions Will Be Listed Below (If Applicable).  If you need a refill on your cardiac medications before your next appointment, please call your pharmacy. 

## 2022-09-01 NOTE — Patient Instructions (Signed)
David Duarte  09/01/2022     '@PREFPERIOPPHARMACY'$ @   Your procedure is scheduled on  09/04/2022.   Report to [facility] at  0700  A.M.   Call this number if you have problems the morning of surgery:  [phone]  If you experience any cold or flu symptoms such as cough, fever, chills, shortness of breath, etc. between now and your scheduled surgery, please notify us at the above number.   Remember:  Follow the diet and prep instructions given to you by the office.        Your last dose of eliquis should be on 09/01/2022.       Take 1/2 of your morning insulin on 09/03/2022.        DO NOT take any medications for diabetes the morning of your procedure.     Take these medicines the morning of surgery with A SIP OF WATER                       aricept, lexapro, prilosec, zofran (if needed).     Do not wear jewelry, make-up or nail polish.  Do not wear lotions, powders, or perfumes, or deodorant.  Do not shave 48 hours prior to surgery.  Men may shave face and neck.  Do not bring valuables to the hospital.  [facility] is not responsible for any belongings or valuables.  Contacts, dentures or bridgework may not be worn into surgery.  Leave your suitcase in the car.  After surgery it may be brought to your room.  For patients admitted to the hospital, discharge time will be determined by your treatment team.  Patients discharged the day of surgery will not be allowed to drive home and must have someone with them for 24 hours.    Special instructions:   DO NOT smoke tobacco or vape for 24 hours before your procedure.  Please read over the following fact sheets that you were given. Anesthesia Post-op Instructions and Care and Recovery After Surgery      Upper Endoscopy, Adult, Care After After the procedure, it is common to have a sore throat. It is also common to have: Mild stomach pain or discomfort. Bloating. Nausea. Follow these instructions at  home: The instructions below may help you care for yourself at home. Your health care provider may give you more instructions. If you have questions, ask your health care provider. If you were given a sedative during the procedure, it can affect you for several hours. Do not drive or operate machinery until your health care provider says that it is safe. If you will be going home right after the procedure, plan to have a responsible adult: Take you home from the hospital or clinic. You will not be allowed to drive. Care for you for the time you are told. Follow instructions from your health care provider about what you may eat and drink. Return to your normal activities as told by your health care provider. Ask your health care provider what activities are safe for you. Take over-the-counter and prescription medicines only as told by your health care provider. Contact a health care provider if you: Have a sore throat that lasts longer than one day. Have trouble swallowing. Have a fever. Get help right away if you: Vomit blood or your vomit looks like coffee grounds. Have bloody, black, or tarry stools. Have a very bad sore throat or you cannot swallow.  Have difficulty breathing or very bad pain in your chest or abdomen. These symptoms may be an emergency. Get help right away. Call 911. Do not wait to see if the symptoms will go away. Do not drive yourself to the hospital. Summary After the procedure, it is common to have a sore throat, mild stomach discomfort, bloating, and nausea. If you were given a sedative during the procedure, it can affect you for several hours. Do not drive until your health care provider says that it is safe. Follow instructions from your health care provider about what you may eat and drink. Return to your normal activities as told by your health care provider. This information is not intended to replace advice given to you by your health care provider. Make sure  you discuss any questions you have with your health care provider. Document Revised: 02/05/2022 Document Reviewed: 02/05/2022 Elsevier Patient Education  [place] After This sheet gives you information about how to care for yourself after your procedure. Your health care provider may also give you more specific instructions. If you have problems or questions, contact your health care provider. What can I expect after the procedure? After the procedure, it is common to have: Tiredness. Forgetfulness about what happened after the procedure. Impaired judgment for important decisions. Nausea or vomiting. Some difficulty with balance. Follow these instructions at home: For the time period you were told by your health care provider:     Rest as needed. Do not participate in activities where you could fall or become injured. Do not drive or use machinery. Do not drink alcohol. Do not take sleeping pills or medicines that cause drowsiness. Do not make important decisions or sign legal documents. Do not take care of children on your own. Eating and drinking Follow the diet that is recommended by your health care provider. Drink enough fluid to keep your urine pale yellow. If you vomit: Drink water, juice, or soup when you can drink without vomiting. Make sure you have little or no nausea before eating solid foods. General instructions Have a responsible adult stay with you for the time you are told. It is important to have someone help care for you until you are awake and alert. Take over-the-counter and prescription medicines only as told by your health care provider. If you have sleep apnea, surgery and certain medicines can increase your risk for breathing problems. Follow instructions from your health care provider about wearing your sleep device: Anytime you are sleeping, including during daytime naps. While taking prescription pain medicines,  sleeping medicines, or medicines that make you drowsy. Avoid smoking. Keep all follow-up visits as told by your health care provider. This is important. Contact a health care provider if: You keep feeling nauseous or you keep vomiting. You feel light-headed. You are still sleepy or having trouble with balance after 24 hours. You develop a rash. You have a fever. You have redness or swelling around the IV site. Get help right away if: You have trouble breathing. You have new-onset confusion at home. Summary For several hours after your procedure, you may feel tired. You may also be forgetful and have poor judgment. Have a responsible adult stay with you for the time you are told. It is important to have someone help care for you until you are awake and alert. Rest as told. Do not drive or operate machinery. Do not drink alcohol or take sleeping pills. Get help right away if you  have trouble breathing, or if you suddenly become confused. This information is not intended to replace advice given to you by your health care provider. Make sure you discuss any questions you have with your health care provider. Document Revised: 10/01/2021 Document Reviewed: 09/29/2019 Elsevier Patient Education  [place].

## 2022-09-02 ENCOUNTER — Encounter (HOSPITAL_COMMUNITY): Payer: Self-pay

## 2022-09-02 ENCOUNTER — Encounter (HOSPITAL_COMMUNITY)
Admission: RE | Admit: 2022-09-02 | Discharge: 2022-09-02 | Disposition: A | Discharge status: Home / Self Care | Payer: Medicare Other | Source: Ambulatory Visit | Attending: Internal Medicine | Admitting: Internal Medicine

## 2022-09-02 VITALS — BP 132/64 | HR 56 | Temp 97.8°F | Resp 18 | Ht 72.0 in | Wt 248.6 lb

## 2022-09-02 DIAGNOSIS — K7469 Other cirrhosis of liver: Secondary | ICD-10-CM | POA: Diagnosis not present

## 2022-09-02 DIAGNOSIS — Z01812 Encounter for preprocedural laboratory examination: Secondary | ICD-10-CM | POA: Diagnosis present

## 2022-09-02 LAB — CBC WITH DIFFERENTIAL/PLATELET
Abs Immature Granulocytes: 0.16 10*3/uL — ABNORMAL HIGH (ref 0.00–0.07)
Basophils Absolute: 0.1 10*3/uL (ref 0.0–0.1)
Basophils Relative: 1 %
Eosinophils Absolute: 0.1 10*3/uL (ref 0.0–0.5)
Eosinophils Relative: 1 %
HCT: 26.2 % — ABNORMAL LOW (ref 39.0–52.0)
Hemoglobin: 7.7 g/dL — ABNORMAL LOW (ref 13.0–17.0)
Immature Granulocytes: 3 %
Lymphocytes Relative: 20 %
Lymphs Abs: 1.3 10*3/uL (ref 0.7–4.0)
MCH: 30.7 pg (ref 26.0–34.0)
MCHC: 29.4 g/dL — ABNORMAL LOW (ref 30.0–36.0)
MCV: 104.4 fL — ABNORMAL HIGH (ref 80.0–100.0)
Monocytes Absolute: 1.9 10*3/uL — ABNORMAL HIGH (ref 0.1–1.0)
Monocytes Relative: 29 %
Neutro Abs: 3.1 10*3/uL (ref 1.7–7.7)
Neutrophils Relative %: 46 %
Platelets: 166 10*3/uL (ref 150–400)
RBC: 2.51 MIL/uL — ABNORMAL LOW (ref 4.22–5.81)
RDW: 16.6 % — ABNORMAL HIGH (ref 11.5–15.5)
WBC: 6.5 10*3/uL (ref 4.0–10.5)
nRBC: 0 % (ref 0.0–0.2)

## 2022-09-02 LAB — COMPREHENSIVE METABOLIC PANEL
ALT: 8 U/L (ref 0–44)
AST: 13 U/L — ABNORMAL LOW (ref 15–41)
Albumin: 3.8 g/dL (ref 3.5–5.0)
Alkaline Phosphatase: 41 U/L (ref 38–126)
Anion gap: 8 (ref 5–15)
BUN: 15 mg/dL (ref 8–23)
CO2: 25 mmol/L (ref 22–32)
Calcium: 8.7 mg/dL — ABNORMAL LOW (ref 8.9–10.3)
Chloride: 105 mmol/L (ref 98–111)
Creatinine, Ser: 1.12 mg/dL (ref 0.61–1.24)
GFR, Estimated: >60 mL/min (ref >60)
Glucose, Bld: 77 mg/dL (ref 70–99)
Potassium: 4 mmol/L (ref 3.5–5.1)
Sodium: 138 mmol/L (ref 135–145)
Total Bilirubin: 1.4 mg/dL — ABNORMAL HIGH (ref 0.3–1.2)
Total Protein: 7.8 g/dL (ref 6.5–8.1)

## 2022-09-02 LAB — PROTIME-INR
INR: 1.2 (ref 0.8–1.2)
Prothrombin Time: 14.8 seconds (ref 11.4–15.2)

## 2022-09-04 ENCOUNTER — Encounter (HOSPITAL_COMMUNITY): Payer: Self-pay | Admitting: Internal Medicine

## 2022-09-04 ENCOUNTER — Other Ambulatory Visit: Payer: Self-pay

## 2022-09-04 ENCOUNTER — Telehealth: Payer: Self-pay

## 2022-09-04 ENCOUNTER — Ambulatory Visit (HOSPITAL_COMMUNITY)
Admission: RE | Admit: 2022-09-04 | Discharge: 2022-09-04 | Disposition: A | Discharge status: Home / Self Care | Payer: Medicare Other | Source: Ambulatory Visit | Attending: Internal Medicine | Admitting: Internal Medicine

## 2022-09-04 ENCOUNTER — Encounter (HOSPITAL_COMMUNITY)
Admission: RE | Disposition: A | Discharge status: Home / Self Care | Payer: Self-pay | Source: Ambulatory Visit | Attending: Internal Medicine

## 2022-09-04 ENCOUNTER — Ambulatory Visit (HOSPITAL_BASED_OUTPATIENT_CLINIC_OR_DEPARTMENT_OTHER): Payer: Medicare Other | Admitting: Anesthesiology

## 2022-09-04 ENCOUNTER — Ambulatory Visit (HOSPITAL_COMMUNITY): Payer: Medicare Other | Admitting: Anesthesiology

## 2022-09-04 DIAGNOSIS — G709 Myoneural disorder, unspecified: Secondary | ICD-10-CM | POA: Insufficient documentation

## 2022-09-04 DIAGNOSIS — Z87891 Personal history of nicotine dependence: Secondary | ICD-10-CM | POA: Diagnosis not present

## 2022-09-04 DIAGNOSIS — F419 Anxiety disorder, unspecified: Secondary | ICD-10-CM | POA: Insufficient documentation

## 2022-09-04 DIAGNOSIS — K7460 Unspecified cirrhosis of liver: Secondary | ICD-10-CM | POA: Insufficient documentation

## 2022-09-04 DIAGNOSIS — I4891 Unspecified atrial fibrillation: Secondary | ICD-10-CM | POA: Diagnosis not present

## 2022-09-04 DIAGNOSIS — K766 Portal hypertension: Secondary | ICD-10-CM

## 2022-09-04 DIAGNOSIS — R1310 Dysphagia, unspecified: Secondary | ICD-10-CM

## 2022-09-04 DIAGNOSIS — R112 Nausea with vomiting, unspecified: Secondary | ICD-10-CM | POA: Insufficient documentation

## 2022-09-04 DIAGNOSIS — K3189 Other diseases of stomach and duodenum: Secondary | ICD-10-CM

## 2022-09-04 DIAGNOSIS — K219 Gastro-esophageal reflux disease without esophagitis: Secondary | ICD-10-CM | POA: Insufficient documentation

## 2022-09-04 HISTORY — PX: MALONEY DILATION: SHX5535

## 2022-09-04 HISTORY — PX: ESOPHAGOGASTRODUODENOSCOPY (EGD) WITH PROPOFOL: SHX5813

## 2022-09-04 LAB — GLUCOSE, CAPILLARY: Glucose-Capillary: 93 mg/dL (ref 70–99)

## 2022-09-04 SURGERY — ESOPHAGOGASTRODUODENOSCOPY (EGD) WITH PROPOFOL
Anesthesia: General

## 2022-09-04 MED ORDER — PROPOFOL 10 MG/ML IV BOLUS
INTRAVENOUS | Status: DC | PRN
  Administered 2022-09-04 (×2): 50 mg via INTRAVENOUS

## 2022-09-04 MED ORDER — STERILE WATER FOR IRRIGATION IR SOLN
Status: DC | PRN
  Administered 2022-09-04: 60 mL

## 2022-09-04 MED ORDER — PANTOPRAZOLE SODIUM 40 MG PO TBEC: 40.0000 mg | DELAYED_RELEASE_TABLET | Freq: Two times a day (BID) | ORAL | 3 refills | Status: DC

## 2022-09-04 MED ORDER — LACTATED RINGERS IV SOLN: INTRAVENOUS | Status: DC

## 2022-09-04 MED ORDER — LIDOCAINE HCL (CARDIAC) PF 100 MG/5ML IV SOSY
PREFILLED_SYRINGE | INTRAVENOUS | Status: DC | PRN
  Administered 2022-09-04: 100 mg via INTRAVENOUS

## 2022-09-04 NOTE — Discharge Instructions (Addendum)
EGD Discharge instructions Please read the instructions outlined below and refer to this sheet in the next few weeks. These discharge instructions provide you with general information on caring for yourself after you leave the hospital. Your doctor may also give you specific instructions. While your treatment has been planned according to the most current medical practices available, unavoidable complications occasionally occur. If you have any problems or questions after discharge, please call your doctor. ACTIVITY You may resume your regular activity but move at a slower pace for the next 24 hours.  Take frequent rest periods for the next 24 hours.  Walking will help expel (get rid of) the air and reduce the bloated feeling in your abdomen.  No driving for 24 hours (because of the anesthesia (medicine) used during the test).  You may shower.  Do not sign any important legal documents or operate any machinery for 24 hours (because of the anesthesia used during the test).  NUTRITION Drink plenty of fluids.  You may resume your normal diet.  Begin with a light meal and progress to your normal diet.  Avoid alcoholic beverages for 24 hours or as instructed by your caregiver.  MEDICATIONS You may resume your normal medications unless your caregiver tells you otherwise.  WHAT YOU CAN EXPECT TODAY You may experience abdominal discomfort such as a feeling of fullness or "gas" pains.  FOLLOW-UP Your doctor will discuss the results of your test with you.  SEEK IMMEDIATE MEDICAL ATTENTION IF ANY OF THE FOLLOWING OCCUR: Excessive nausea (feeling sick to your stomach) and/or vomiting.  Severe abdominal pain and distention (swelling).  Trouble swallowing.  Temperature over 101 F (37.8 C).  Rectal bleeding or vomiting of blood.    Your esophagus appeared normal today.  It was stretched  No blockage in your stomach.  Increase Protonix to 40 mg twice daily before breakfast and supper.  You may  have a lazy stomach due to diabetes.  Gastroparesis diet (5-6 smaller meals daily instead of 3 typical meals  Solid phase gastric emptying study in the near future to evaluate postprandial nausea and vomiting  Office visit in 4 weeks with Roseanne Kaufman  From a GI standpoint, can proceed with cardiology evaluation.  At patient request, I called Emma at [phone]-no answer.  Resume Eliquis today

## 2022-09-04 NOTE — Anesthesia Preprocedure Evaluation (Signed)
Anesthesia Evaluation  Patient identified by MRN, date of birth, ID band Patient awake    Reviewed: Allergy & Precautions, H&P , NPO status , Patient's Chart, lab work & pertinent test results, reviewed documented beta blocker date and time   Airway Mallampati: II  TM Distance: >3 FB Neck ROM: full    Dental no notable dental hx.    Pulmonary neg pulmonary ROS, former smoker,    Pulmonary exam normal breath sounds clear to auscultation       Cardiovascular Exercise Tolerance: Good + dysrhythmias Atrial Fibrillation  Rhythm:irregular Rate:Normal     Neuro/Psych PSYCHIATRIC DISORDERS Anxiety  Neuromuscular disease    GI/Hepatic hiatal hernia, GERD  Medicated,(+) Cirrhosis       ,   Endo/Other  negative endocrine ROSdiabetes, Type 2  Renal/GU negative Renal ROS  negative genitourinary   Musculoskeletal   Abdominal   Peds  Hematology  (+) Blood dyscrasia, anemia ,   Anesthesia Other Findings   Reproductive/Obstetrics negative OB ROS                             Anesthesia Physical Anesthesia Plan  ASA: 2  Anesthesia Plan: General   Post-op Pain Management:    Induction:   PONV Risk Score and Plan: Propofol infusion  Airway Management Planned:   Additional Equipment:   Intra-op Plan:   Post-operative Plan:   Informed Consent: I have reviewed the patients History and Physical, chart, labs and discussed the procedure including the risks, benefits and alternatives for the proposed anesthesia with the patient or authorized representative who has indicated his/her understanding and acceptance.     Dental Advisory Given  Plan Discussed with: CRNA  Anesthesia Plan Comments:         Anesthesia Quick Evaluation

## 2022-09-04 NOTE — Telephone Encounter (Signed)
-----   Message from Daneil Dolin, MD sent at 09/04/2022  8:34 AM EDT ----- New prescription for Protonix 40 mg twice daily dispense 60 with 3 refills.  Thanks.

## 2022-09-04 NOTE — Op Note (Signed)
[facility] Patient Name: David Duarte Procedure Date: 09/04/2022 7:50 AM MRN: [id number] Date of Birth: 1941/10/30 Attending MD: Norvel Richards , MD, [id number] CSN: [id number] Age: 81 Admit Type: Outpatient Procedure:                Upper GI endoscopy Indications:              Dysphagia, Nausea with vomiting; NO ABDOMINAL PAIN Providers:                Norvel Richards, MD, Caprice Kluver, Gwynneth Albright RN, RN, Ladoris Gene Technician, Technician Referring MD:              Medicines:                Propofol per Anesthesia Complications:            No immediate complications. Estimated Blood Loss:     Estimated blood loss: none. Procedure:                Pre-Anesthesia Assessment:                           - Prior to the procedure, a History and Physical                            was performed, and patient medications and                            allergies were reviewed. The patient's tolerance of                            previous anesthesia was also reviewed. The risks                            and benefits of the procedure and the sedation                            options and risks were discussed with the patient.                            All questions were answered, and informed consent                            was obtained. Prior Anticoagulants: The patient has                            taken no anticoagulant or antiplatelet agents. ASA                            Grade Assessment: III - A patient with severe                            systemic disease. After reviewing the risks and  benefits, the patient was deemed in satisfactory                            condition to undergo the procedure.                           After obtaining informed consent, the endoscope was                            passed under direct vision. Throughout the                            procedure, the patient's  blood pressure, pulse, and                            oxygen saturations were monitored continuously. The                            GIF-H190 ([id number]) scope was introduced through the                            mouth, and advanced to the second part of duodenum.                            The upper GI endoscopy was accomplished without                            difficulty. The patient tolerated the procedure                            well. Scope In: 8:14:52 AM Scope Out: 8:19:41 AM Total Procedure Duration: 0 hours 4 minutes 49 seconds  Findings:      The examined esophagus was normal. Gastric cavity empty. Mild diffuse       mucosal changes consistent with portal hypertensive gastropathy. No       gastric varices or other mucosal abnormality. Pylorus patent.      The duodenal bulb and second portion of the duodenum were normal. The       scope was withdrawn. Dilation was performed with a Maloney dilator with       mild resistance at 56 Fr. The dilation site was examined following       endoscope reinsertion and showed no change. Estimated blood loss: none. Impression:               - Normal esophagus. Dilated. Mild portal                            hypertensive gastropathy.                           - Normal duodenal bulb and second portion of the                            duodenum.                           -  No specimens collected. Moderate Sedation:      Moderate (conscious) sedation was personally administered by an       anesthesia professional. The following parameters were monitored: oxygen       saturation, heart rate, blood pressure, respiratory rate, EKG, adequacy       of pulmonary ventilation, and response to care. Recommendation:           - Patient has a contact number available for                            emergencies. The signs and symptoms of potential                            delayed complications were discussed with the                            patient.  Return to normal activities tomorrow.                            Written discharge instructions were provided to the                            patient.                           - Resume previous diet.                           - Continue present medications. Gastroparesis diet.                            Solid-phase gastric emptying study to further                            evaluate for gastroparesis as an outpatient                           - Return to my office in 1 month. From a GI                            standpoint, at this time I feel that patient should                            go ahead and proceed with cardiology evaluation                            including catheterization, etc. Procedure Code(s):        --- Professional ---                           [phone], Esophagogastroduodenoscopy, flexible,                            transoral; diagnostic, including collection of  specimen(s) by brushing or washing, when performed                            (separate procedure)                           43450, Dilation of esophagus, by unguided sound or                            bougie, single or multiple passes Diagnosis Code(s):        --- Professional ---                           R13.10, Dysphagia, unspecified                           R11.2, Nausea with vomiting, unspecified CPT copyright 2022 American Medical Association. All rights reserved. The codes documented in this report are preliminary and upon coder review may  be revised to meet current compliance requirements. [name]. Aleksandra Raben, MD Norvel Richards, MD 09/04/2022 8:41:20 AM This report has been signed electronically. Number of Addenda: 0
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
## 2022-09-04 NOTE — Telephone Encounter (Signed)
RX updated and new RX sent to pharmacy on file.

## 2022-09-04 NOTE — Transfer of Care (Signed)
Immediate Anesthesia Transfer of Care Note  Patient: David Duarte  Procedure(s) Performed: ESOPHAGOGASTRODUODENOSCOPY (EGD) WITH PROPOFOL MALONEY DILATION  Patient Location: PACU and Short Stay  Anesthesia Type:General  Level of Consciousness: awake, alert , oriented and patient cooperative  Airway & Oxygen Therapy: Patient Spontanous Breathing  Post-op Assessment: Report given to RN, Post -op Vital signs reviewed and stable and Patient moving all extremities X 4  Post vital signs: Reviewed and stable  Last Vitals:  Vitals Value Taken Time  BP    Temp    Pulse    Resp    SpO2      Last Pain:  Vitals:   09/04/22 0811  TempSrc:   PainSc: 3          Complications: No notable events documented.

## 2022-09-05 NOTE — Anesthesia Postprocedure Evaluation (Signed)
Anesthesia Post Note  Patient: David Duarte  Procedure(s) Performed: ESOPHAGOGASTRODUODENOSCOPY (EGD) WITH PROPOFOL [place]  Patient location during evaluation: Phase II Anesthesia Type: General Level of consciousness: awake Pain management: pain level controlled Vital Signs Assessment: post-procedure vital signs reviewed and stable Respiratory status: spontaneous breathing and respiratory function stable Cardiovascular status: blood pressure returned to baseline and stable Postop Assessment: no headache and no apparent nausea or vomiting Anesthetic complications: no Comments: Late entry   No notable events documented.   Last Vitals:  Vitals:   09/04/22 0824 09/04/22 0829  BP: (!) 99/45 118/62  Pulse: 60   Resp: 14   Temp: 36.8 C   SpO2: 95%     Last Pain:  Vitals:   09/04/22 0824  TempSrc: Oral  PainSc: 0-No pain                 Louann Sjogren

## 2022-09-08 ENCOUNTER — Encounter (HOSPITAL_COMMUNITY): Payer: Self-pay | Admitting: Internal Medicine

## 2022-09-11 ENCOUNTER — Encounter: Payer: Self-pay | Admitting: Orthopaedic Surgery

## 2022-09-11 ENCOUNTER — Ambulatory Visit (INDEPENDENT_AMBULATORY_CARE_PROVIDER_SITE_OTHER): Payer: No Typology Code available for payment source | Admitting: Orthopaedic Surgery

## 2022-09-11 VITALS — Ht 72.0 in | Wt 248.0 lb

## 2022-09-11 DIAGNOSIS — I259 Chronic ischemic heart disease, unspecified: Secondary | ICD-10-CM | POA: Diagnosis not present

## 2022-09-11 DIAGNOSIS — M48062 Spinal stenosis, lumbar region with neurogenic claudication: Secondary | ICD-10-CM
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
## 2022-09-16 ENCOUNTER — Ambulatory Visit (INDEPENDENT_AMBULATORY_CARE_PROVIDER_SITE_OTHER): Payer: No Typology Code available for payment source | Admitting: Orthopedic Surgery

## 2022-09-16 ENCOUNTER — Ambulatory Visit (INDEPENDENT_AMBULATORY_CARE_PROVIDER_SITE_OTHER): Payer: No Typology Code available for payment source

## 2022-09-16 DIAGNOSIS — M545 Low back pain, unspecified: Secondary | ICD-10-CM | POA: Diagnosis not present

## 2022-09-16 DIAGNOSIS — G8929 Other chronic pain: Secondary | ICD-10-CM

## 2022-09-16 NOTE — Progress Notes (Signed)
Orthopedic Spine Surgery Office Note  Assessment: Patient is a 81 y.o. male with low back pain, possible facetogenic etiology.  He does have lateral recess stenosis at L3-4 and L4-5, but is not having any leg pain so do not think the stenosis is the etiology of his symptoms   Plan: -Explained that chronic low back pain is a difficult problem.  He does have facet arthropathy with hypertrophic facets that could be a reason for back pain.  He also has fatty infiltration of his paraspinal muscles in his lower lumbar spine. We discussed weight loss and core strengthening as long-term management strategies to reduce the pain.  A referral was provided to him for physical therapy to work on lumbar stabilization.  I also talked about pain management for medical management and possible RFA to see if that gives him some relief.  I discussed the fact that our goal is not complete pain relief but to make his pain more tolerable so he can do his daily activities.  I will see him back on an as-needed basis.   Patient expressed understanding of the plan and all questions were answered to the patient's satisfaction.   ___________________________________________________________________________   History:  Patient is a 81 y.o. male who presents today for lumbar spine.  Patient has had several years of low back pain that has been getting worse recently.  He feels it throughout the lumbar spine.  He says he gets better if he sits down.  Is worse with any activity especially flexion and extension to the back  He has not had any relief with the conservative treatments he is tried.  His pain seems to wax and wane in terms of severity.  He says there are some days that it so bad that he cannot do anything.  However, other times his pain is more tolerable.  There was no trauma or injury that brought on the pain.   Weakness: Denies Symptoms of imbalance: Denies Paresthesias and numbness: Denies Bowel or bladder  incontinence: Denies Saddle anesthesia: Denies  Treatments tried: Tylenol, NSAIDs, steroid injection  Review of systems: Denies fevers and chills, night sweats, unexplained weight loss, history of cancer.  Pain has caused him to wake at night in the past  Past medical history: Hyperlipidemia Depression GERD Diabetes  Allergies: NKDA  Past surgical history:  Left leg fracture ORIF Polypectomy Vascular bypass surgery with coiling  Social history: Denies use of nicotine product (smoking, vaping, patches, smokeless) Alcohol use: Denies Denies recreational drug use   Physical Exam:  General: no acute distress, appears stated age Neurologic: alert, answering questions appropriately, following commands Respiratory: unlabored breathing on room air, symmetric chest rise Psychiatric: appropriate affect, normal cadence to speech   MSK (spine):  -Strength exam      Left  Right EHL    4/5  4/5 TA    5/5  5/5 GSC    5/5  5/5 Knee extension  5/5  5/5 Hip flexion   5/5  5/5  -Sensory exam    Sensation intact to light touch in L3-S1 nerve distributions of bilateral lower extremities  -Achilles DTR: 1/4 on the left, 1/4 on the right -Patellar tendon DTR: 1/4 on the left, 1/4 on the right  -Straight leg raise: Negative -Contralateral straight leg raise: Negative -Clonus: no beats bilaterally  -Left hip exam: Pain with FADIR but not same pain that he presented for.  No pain through remainder of range of motion at the hip.  Negative Stinchfield. -Right  hip exam: Pain with FADIR but not same pain that he presented for.  No pain through remainder of range of motion at the hip.  Negative Stinchfield.  Imaging: XR of the lumbar spine from 09/16/2022 was independently reviewed and interpreted, showing vascular stenting with what appears to be coils on the right side. Anterior osteophyte formation at L3/4 and L2/3. No significant disc height loss. No fracture or dislocation. No  evidence of instability on flexion/extension.   MRI of the lumbar spine from 02/22/2022 was independently reviewed and interpreted, showing lateral recess stenosis at L3/4, lateral recess stenosis at L4/5. Foraminal stenosis on the left at L3/4. Facet arthropathy. Fatty infiltration of the lower lumbar paraspinal muscles.   Patient name: David Duarte Patient MRN: [id number] Date of visit: 09/16/22

## 2022-09-18 ENCOUNTER — Ambulatory Visit: Payer: No Typology Code available for payment source | Admitting: Orthopaedic Surgery

## 2022-09-23 ENCOUNTER — Ambulatory Visit: Payer: Medicare Other | Attending: Orthopedic Surgery | Admitting: Physical Therapy

## 2022-09-23 ENCOUNTER — Other Ambulatory Visit: Payer: Self-pay

## 2022-09-23 ENCOUNTER — Encounter: Payer: Self-pay | Admitting: Physical Medicine & Rehabilitation

## 2022-09-23 DIAGNOSIS — M545 Low back pain, unspecified: Secondary | ICD-10-CM | POA: Insufficient documentation

## 2022-09-23 DIAGNOSIS — R293 Abnormal posture: Secondary | ICD-10-CM | POA: Insufficient documentation

## 2022-09-23 DIAGNOSIS — M5459 Other low back pain: Secondary | ICD-10-CM | POA: Insufficient documentation

## 2022-09-23 DIAGNOSIS — G8929 Other chronic pain: Secondary | ICD-10-CM | POA: Insufficient documentation
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
## 2022-09-25 ENCOUNTER — Ambulatory Visit: Payer: Medicare Other

## 2022-09-25 DIAGNOSIS — M5459 Other low back pain: Secondary | ICD-10-CM | POA: Diagnosis not present

## 2022-09-25 DIAGNOSIS — R293 Abnormal posture: Secondary | ICD-10-CM
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
## 2022-09-29 ENCOUNTER — Ambulatory Visit: Payer: Medicare Other | Admitting: Physical Therapy

## 2022-09-29 ENCOUNTER — Telehealth: Payer: Self-pay

## 2022-09-29 ENCOUNTER — Encounter: Payer: Self-pay | Admitting: Physical Therapy

## 2022-09-29 DIAGNOSIS — R293 Abnormal posture: Secondary | ICD-10-CM

## 2022-09-29 DIAGNOSIS — M5459 Other low back pain: Secondary | ICD-10-CM

## 2022-09-29 NOTE — Telephone Encounter (Signed)
Returned call from vm and no answer. LMOVM for pt to call regarding his call last Friday 17th.

## 2022-09-29 NOTE — Telephone Encounter (Signed)
Returning pt call and no ans. Vm left for the pt to return call
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
## 2022-09-30 ENCOUNTER — Encounter: Payer: Self-pay | Admitting: Gastroenterology

## 2022-09-30 ENCOUNTER — Ambulatory Visit (INDEPENDENT_AMBULATORY_CARE_PROVIDER_SITE_OTHER): Payer: No Typology Code available for payment source | Admitting: Gastroenterology

## 2022-09-30 VITALS — BP 127/69 | HR 71 | Temp 98.6°F | Ht 72.0 in | Wt 240.6 lb

## 2022-09-30 DIAGNOSIS — D539 Nutritional anemia, unspecified: Secondary | ICD-10-CM | POA: Diagnosis not present

## 2022-09-30 DIAGNOSIS — K7469 Other cirrhosis of liver: Secondary | ICD-10-CM | POA: Diagnosis not present

## 2022-09-30 DIAGNOSIS — K5900 Constipation, unspecified: Secondary | ICD-10-CM

## 2022-09-30 DIAGNOSIS — R112 Nausea with vomiting, unspecified: Secondary | ICD-10-CM

## 2022-09-30 DIAGNOSIS — I259 Chronic ischemic heart disease, unspecified: Secondary | ICD-10-CM | POA: Diagnosis not present
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
## 2022-09-30 NOTE — Patient Instructions (Signed)
Please have blood work done at [facility].  Routine ultrasound will be due in Dec 2023.  I have ordered a gastric emptying test to further evaluate your symptoms.  For constipation: let's try the lower dosage of Linzess. Linzess works best when taken once a day every day, on an empty stomach, at least 30 minutes before your first meal of the day.  When Linzess is taken daily as directed:  *Constipation relief is typically felt in about a week *IBS-C patients may begin to experience relief from belly pain and overall abdominal symptoms (pain, discomfort, and bloating) in about 1 week,   with symptoms typically improving over 12 weeks.  Diarrhea may occur in the first 2 weeks -keep taking it.  The diarrhea should go away and you should start having normal, complete, full bowel movements. It may be helpful to start treatment when you can be near the comfort of your own bathroom, such as a weekend.   Will see you in 3 months!  I enjoyed seeing you again today! As you know, I value our relationship and want to provide genuine, compassionate, and quality care. I welcome your feedback. If you receive a survey regarding your visit,  I greatly appreciate you taking time to fill this out. See you next time!  Annitta Needs, PhD, ANP-BC [facility]

## 2022-10-01 ENCOUNTER — Ambulatory Visit: Payer: Medicare Other

## 2022-10-01 LAB — CBC WITH DIFFERENTIAL/PLATELET
Basophils Absolute: 0.1 10*3/uL (ref 0.0–0.2)
Basos: 1 %
EOS (ABSOLUTE): 0.1 10*3/uL (ref 0.0–0.4)
Eos: 1 %
Hematocrit: 26.8 % — ABNORMAL LOW (ref 37.5–51.0)
Hemoglobin: 8.1 g/dL — ABNORMAL LOW (ref 13.0–17.7)
Immature Grans (Abs): 0.3 10*3/uL — ABNORMAL HIGH (ref 0.0–0.1)
Immature Granulocytes: 4 %
Lymphocytes Absolute: 1.7 10*3/uL (ref 0.7–3.1)
Lymphs: 21 %
MCH: 29.9 pg (ref 26.6–33.0)
MCHC: 30.2 g/dL — ABNORMAL LOW (ref 31.5–35.7)
MCV: 99 fL — ABNORMAL HIGH (ref 79–97)
Monocytes Absolute: 2.2 10*3/uL — ABNORMAL HIGH (ref 0.1–0.9)
Monocytes: 26 %
Neutrophils Absolute: 4 10*3/uL (ref 1.4–7.0)
Neutrophils: 47 %
Platelets: 185 10*3/uL (ref 150–450)
RBC: 2.71 x10E6/uL — CL (ref 4.14–5.80)
RDW: 16 % — ABNORMAL HIGH (ref 11.6–15.4)
WBC: 8.4 10*3/uL (ref 3.4–10.8)

## 2022-10-01 LAB — B12 AND FOLATE PANEL
Folate: 5.8 ng/mL (ref >3.0)
Vitamin B-12: 522 pg/mL (ref 232–1245)

## 2022-10-06 ENCOUNTER — Encounter: Payer: Self-pay | Admitting: Physical Therapy

## 2022-10-06 ENCOUNTER — Ambulatory Visit: Payer: Medicare Other | Admitting: Physical Therapy

## 2022-10-06 DIAGNOSIS — M5459 Other low back pain: Secondary | ICD-10-CM

## 2022-10-06 DIAGNOSIS — R293 Abnormal posture: Secondary | ICD-10-CM
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
## 2022-10-08 ENCOUNTER — Encounter: Payer: Self-pay | Admitting: Cardiology

## 2022-10-08 ENCOUNTER — Ambulatory Visit: Payer: Medicare Other | Attending: Cardiology | Admitting: Cardiology

## 2022-10-08 VITALS — BP 120/58 | HR 65 | Ht 72.0 in | Wt 234.6 lb

## 2022-10-08 DIAGNOSIS — I480 Paroxysmal atrial fibrillation: Secondary | ICD-10-CM | POA: Diagnosis not present

## 2022-10-08 DIAGNOSIS — I739 Peripheral vascular disease, unspecified: Secondary | ICD-10-CM | POA: Insufficient documentation

## 2022-10-08 DIAGNOSIS — I259 Chronic ischemic heart disease, unspecified: Secondary | ICD-10-CM | POA: Insufficient documentation

## 2022-10-08 NOTE — Patient Instructions (Signed)
Medication Instructions:  ?Your physician recommends that you continue on your current medications as directed. Please refer to the Current Medication list given to you today. ? ? ?Labwork: ?None today ? ?Testing/Procedures: ?None today ? ?Follow-Up: ?3 months ? ?Any Other Special Instructions Will Be Listed Below (If Applicable). ? ?If you need a refill on your cardiac medications before your next appointment, please call your pharmacy. ? ?

## 2022-10-09 ENCOUNTER — Encounter: Payer: Self-pay | Admitting: *Deleted

## 2022-10-09 ENCOUNTER — Ambulatory Visit: Payer: Medicare Other | Admitting: *Deleted

## 2022-10-09 DIAGNOSIS — R293 Abnormal posture: Secondary | ICD-10-CM

## 2022-10-09 DIAGNOSIS — M5459 Other low back pain: Secondary | ICD-10-CM
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
## 2022-10-13 ENCOUNTER — Other Ambulatory Visit: Payer: Self-pay | Admitting: Cardiology

## 2022-10-14 ENCOUNTER — Ambulatory Visit: Payer: Medicare Other | Attending: Orthopedic Surgery | Admitting: Physical Therapy

## 2022-10-15 ENCOUNTER — Encounter (HOSPITAL_COMMUNITY): Payer: No Typology Code available for payment source

## 2022-10-16 ENCOUNTER — Encounter: Payer: No Typology Code available for payment source | Admitting: Physical Therapy

## 2022-10-17 ENCOUNTER — Encounter (HOSPITAL_COMMUNITY)
Admission: RE | Admit: 2022-10-17 | Discharge: 2022-10-17 | Disposition: A | Discharge status: Home / Self Care | Payer: Medicare Other | Source: Ambulatory Visit | Attending: Gastroenterology | Admitting: Gastroenterology

## 2022-10-17 DIAGNOSIS — R112 Nausea with vomiting, unspecified: Secondary | ICD-10-CM | POA: Diagnosis present

## 2022-10-17 MED ORDER — TECHNETIUM TC 99M SULFUR COLLOID
2.0000 | Freq: Once | INTRAVENOUS | Status: AC | PRN
  Administered 2022-10-17: 2.2 via INTRAVENOUS

## 2022-10-20 ENCOUNTER — Other Ambulatory Visit: Payer: Self-pay

## 2022-10-20 DIAGNOSIS — K7469 Other cirrhosis of liver: Secondary | ICD-10-CM

## 2022-10-20 DIAGNOSIS — D649 Anemia, unspecified: Secondary | ICD-10-CM

## 2022-10-21 ENCOUNTER — Encounter: Payer: Self-pay | Admitting: *Deleted

## 2022-10-22 ENCOUNTER — Ambulatory Visit (HOSPITAL_COMMUNITY): Admission: RE | Admit: 2022-10-22 | Payer: No Typology Code available for payment source | Source: Ambulatory Visit

## 2022-10-29 ENCOUNTER — Encounter (HOSPITAL_COMMUNITY): Payer: Self-pay | Admitting: Internal Medicine

## 2022-10-29 ENCOUNTER — Ambulatory Visit (HOSPITAL_COMMUNITY)
Admission: RE | Admit: 2022-10-29 | Discharge: 2022-10-29 | Disposition: A | Discharge status: Home / Self Care | Payer: Medicare Other | Source: Ambulatory Visit | Attending: Internal Medicine | Admitting: Internal Medicine

## 2022-10-29 ENCOUNTER — Encounter (HOSPITAL_COMMUNITY)
Admission: RE | Disposition: A | Discharge status: Home / Self Care | Payer: Self-pay | Source: Ambulatory Visit | Attending: Internal Medicine

## 2022-10-29 DIAGNOSIS — D509 Iron deficiency anemia, unspecified: Secondary | ICD-10-CM | POA: Diagnosis present

## 2022-10-29 DIAGNOSIS — K7460 Unspecified cirrhosis of liver: Secondary | ICD-10-CM | POA: Insufficient documentation

## 2022-10-29 DIAGNOSIS — R162 Hepatomegaly with splenomegaly, not elsewhere classified: Secondary | ICD-10-CM | POA: Insufficient documentation

## 2022-10-29 HISTORY — PX: GIVENS CAPSULE STUDY: SHX5432

## 2022-10-29 SURGERY — IMAGING PROCEDURE, GI TRACT, INTRALUMINAL, VIA CAPSULE
Anesthesia: Monitor Anesthesia Care

## 2022-10-31 ENCOUNTER — Encounter (HOSPITAL_COMMUNITY): Payer: Self-pay | Admitting: Internal Medicine

## 2022-10-31 NOTE — Procedures (Incomplete)
Small Bowel [place] Capsule Study Procedure date:  10/29/22  PCP:  Dr. Bernerd Limbo, MD  Indication for procedure: 81 year old male with history of GERD, dysphagia, constipation, adenomatous colon polyps, cirrhosis, macrocytic anemia.  EGD 09/04/22 with normal esophagus s/p dilation, mild portal gastropathy, normal duodenum.  Last TCS in February 2022 with diverticulosis, 2 tubular adenomas at the splenic flexure removed, otherwise normal.  Has continued to have evidence of anemia with most recent hemoglobin being 7.6 s/p 1 unit PRBC.  Capsule study recommended for complete evaluation of anemia. No follow up CBC post transfusion.  Patient data:  Wt: 103 kg Ht: 6'  Findings:  Complete to the cecum. Single gastric erosion noted at 00:22:33. Few possible phlebectasia's within the small bowel at 01:04:50, 01:17:46, 1:30:41, 01:32:52, 01:33:44. Few tiny scattered erosions/petechiae in the distal small bowel around 2 hours and 2 hours 2 minutes.   First Gastric image:  00:02:52 First Duodenal image: 00:31:57 First Ileo-Cecal Valve image: 02:09:30 First Cecal image: 02:09:37 Gastric Passage time: 0h 25mSmall Bowel Passage time:  1h 31mSummary & Recommendations: Complete study to the cecum with a small gastric erosion and few mid small bowel phlebectasia's not likely cause of anemia. Few distal TI erosions/petechiae. Likely anemia secondary to cirrhosis in the setting of chronic anticoagulation and intermittent oozing.  Complete CBC and iron studies this week.  Begin oral ferrous sulfate 325 mg daily Follow up in office in 4 weeks with AB or Dr. RoGala Romneyonitor for rectal bleeding or melena and If evidence of rectal bleeding would consider updating colonoscopy.  Would recommend referral to hematology given need for transfusion.    CoVenetia NightMSN, APRN, FNP-BC, AGACNP-BC [facility]

## 2022-11-05 ENCOUNTER — Telehealth: Payer: Self-pay | Admitting: Internal Medicine

## 2022-11-05 NOTE — Telephone Encounter (Signed)
Wife left a message that patient had a procedure done last week and they are wanting to see if the results are back.  Please call when you have them.

## 2022-11-05 NOTE — Telephone Encounter (Signed)
Please advise 

## 2022-11-06 ENCOUNTER — Telehealth: Payer: Self-pay | Admitting: Gastroenterology

## 2022-11-06 DIAGNOSIS — D649 Anemia, unspecified: Secondary | ICD-10-CM

## 2022-11-06 NOTE — Telephone Encounter (Signed)
Patient's capsule study read.  Has been sent to Dr. Gala Romney for final review.  Findings stated below: Complete study to the cecum with a small gastric erosion and few mid small bowel phlebectasia's not likely cause of anemia. Few distal TI erosions/petechiae. Likely anemia secondary to cirrhosis in the setting of chronic anticoagulation and intermittent oozing.  Recommendations: Complete CBC and iron studies this week.  Begin oral ferrous sulfate 325 mg daily Follow up in office in 4 weeks with AB or Dr. Gala Romney Monitor for rectal bleeding or melena and If evidence of rectal bleeding would consider updating colonoscopy.  Would recommend referral to hematology given need for transfusion.   Demetrius Charity -please arrange CBC and iron panel (iron, saturation, and ferritin) to be performed this week or early next week (Tuesday).  Advised patient to begin oral ferrous sulfate 325 mg daily, will send prescription to pharmacy.  Advised him to notify us if he has any rectal bleeding or melena.  May need to consider updating colonoscopy.  Advised him that I would like for him to follow-up with hematology given his need for transfusion in the past.  Mandy -Ensure patient has follow-up in 4 weeks with Vicente Males or Dr. Gala Romney.   Mindy/Tammy R -'s referral to hematology.  Diagnosis: Anemia, need for blood transfusion.  Venetia Night, MSN, APRN, FNP-BC, AGACNP-BC [facility] at [facility]

## 2022-11-06 NOTE — Telephone Encounter (Signed)
Error

## 2022-11-06 NOTE — Telephone Encounter (Signed)
Referral sent for hematology

## 2022-11-06 NOTE — Addendum Note (Signed)
Addended by: Cheron Every on: 11/06/2022 02:50 PM   Modules accepted: Orders

## 2022-11-06 NOTE — Telephone Encounter (Signed)
Spoke with patient's wife and informed her that we have five business days to get the results to her and that the results and images were being reviewed.

## 2022-11-06 NOTE — Telephone Encounter (Signed)
Loma Sousa was assigned to read this. Please address with her. We can let him know that she will be reviewing images with Dr. Gala Romney as well.

## 2022-11-07 NOTE — Telephone Encounter (Signed)
Spoke to wife Terrence Dupont [facility]) informed her of recommendations. She voiced understanding.

## 2022-11-07 NOTE — Telephone Encounter (Signed)
Spoke to pt's wife Terrence Dupont ([place]), informed her of results and recommendations. She voiced understanding. She states he was sick all day yesterday. He had nausea and vomiting. She states she thinks he needs something stronger than Zofran.

## 2022-11-13 ENCOUNTER — Inpatient Hospital Stay: Payer: Medicare Other | Attending: Hematology | Admitting: Hematology

## 2022-11-13 ENCOUNTER — Encounter: Payer: Self-pay | Admitting: Hematology

## 2022-11-13 ENCOUNTER — Inpatient Hospital Stay: Payer: Medicare Other

## 2022-11-13 VITALS — BP 129/79 | HR 58 | Temp 98.2°F | Resp 16 | Ht 72.0 in | Wt 237.6 lb

## 2022-11-13 DIAGNOSIS — D509 Iron deficiency anemia, unspecified: Secondary | ICD-10-CM | POA: Diagnosis not present

## 2022-11-13 DIAGNOSIS — I480 Paroxysmal atrial fibrillation: Secondary | ICD-10-CM | POA: Insufficient documentation

## 2022-11-13 DIAGNOSIS — D539 Nutritional anemia, unspecified: Secondary | ICD-10-CM | POA: Diagnosis not present

## 2022-11-13 DIAGNOSIS — Z7901 Long term (current) use of anticoagulants: Secondary | ICD-10-CM | POA: Insufficient documentation

## 2022-11-13 DIAGNOSIS — Z86711 Personal history of pulmonary embolism: Secondary | ICD-10-CM | POA: Diagnosis not present

## 2022-11-13 DIAGNOSIS — D649 Anemia, unspecified: Secondary | ICD-10-CM | POA: Diagnosis not present

## 2022-11-13 DIAGNOSIS — Z79899 Other long term (current) drug therapy: Secondary | ICD-10-CM | POA: Insufficient documentation

## 2022-11-13 LAB — RETICULOCYTES
Immature Retic Fract: 31.4 % — ABNORMAL HIGH (ref 2.3–15.9)
RBC.: 2.67 MIL/uL — ABNORMAL LOW (ref 4.22–5.81)
Retic Count, Absolute: 182.4 10*3/uL (ref 19.0–186.0)
Retic Ct Pct: 6.8 % — ABNORMAL HIGH (ref 0.4–3.1)

## 2022-11-13 LAB — CBC WITH DIFFERENTIAL/PLATELET
Abs Immature Granulocytes: 0.27 10*3/uL — ABNORMAL HIGH (ref 0.00–0.07)
Band Neutrophils: 0 %
Basophils Absolute: 0 10*3/uL (ref 0.0–0.1)
Basophils Relative: 0 %
Blasts: 0 %
Eosinophils Absolute: 0.1 10*3/uL (ref 0.0–0.5)
Eosinophils Relative: 1 %
HCT: 27.5 % — ABNORMAL LOW (ref 39.0–52.0)
Hemoglobin: 8.3 g/dL — ABNORMAL LOW (ref 13.0–17.0)
Lymphocytes Relative: 24 %
Lymphs Abs: 1.6 10*3/uL (ref 0.7–4.0)
MCH: 31.3 pg (ref 26.0–34.0)
MCHC: 30.2 g/dL (ref 30.0–36.0)
MCV: 103.8 fL — ABNORMAL HIGH (ref 80.0–100.0)
Metamyelocytes Relative: 0 %
Monocytes Absolute: 1.6 10*3/uL — ABNORMAL HIGH (ref 0.1–1.0)
Monocytes Relative: 24 %
Myelocytes: 4 %
Neutro Abs: 3.1 10*3/uL (ref 1.7–7.7)
Neutrophils Relative %: 47 %
Other: 0 %
Platelets: 160 10*3/uL (ref 150–400)
Promyelocytes Relative: 0 %
RBC: 2.65 MIL/uL — ABNORMAL LOW (ref 4.22–5.81)
RDW: 18.7 % — ABNORMAL HIGH (ref 11.5–15.5)
WBC: 6.7 10*3/uL (ref 4.0–10.5)
nRBC: 0 % (ref 0.0–0.2)
nRBC: 0 /100 WBC

## 2022-11-13 LAB — DIRECT ANTIGLOBULIN TEST (NOT AT ARMC)
DAT, IgG: NEGATIVE
DAT, complement: POSITIVE

## 2022-11-13 LAB — IRON AND TIBC
Iron: 72 ug/dL (ref 45–182)
Saturation Ratios: 14 % — ABNORMAL LOW (ref 17.9–39.5)
TIBC: 520 ug/dL — ABNORMAL HIGH (ref 250–450)
UIBC: 448 ug/dL

## 2022-11-13 LAB — VITAMIN B12: Vitamin B-12: 342 pg/mL (ref 180–914)

## 2022-11-13 LAB — FERRITIN: Ferritin: 78 ng/mL (ref 24–336)

## 2022-11-13 LAB — TSH: TSH: 4.144 u[IU]/mL (ref 0.350–4.500)

## 2022-11-13 LAB — LACTATE DEHYDROGENASE: LDH: 169 U/L (ref 98–192)

## 2022-11-13 NOTE — Patient Instructions (Addendum)
[facility]  Discharge Instructions  You were seen and examined today by Dr. Delton Coombes. Dr. Delton Coombes is a hematologist, meaning that he specializes in blood abnormalities. Dr. Delton Coombes discussed your past medical history, family history of cancers/blood conditions and the events that led to you being here today.'  You were referred to Dr. Delton Coombes due to anemia (low blood counts).  Dr. Delton Coombes has recommended additional lab work today for further evaluation.  To rule out the fact that you may be losing blood through your stool, Dr. Delton Coombes has requested that you collect your stool on 3 different occurrences for testing. Once completed, please bring them back to the lab here at [facility].  Please follow-up as scheduled.  Thank you for choosing [facility] to provide your oncology and hematology care.   To afford each patient quality time with our provider, please arrive at least 15 minutes before your scheduled appointment time. You may need to reschedule your appointment if you arrive late (10 or more minutes). Arriving late affects you and other patients whose appointments are after yours.  Also, if you miss three or more appointments without notifying the office, you may be dismissed from the clinic at the provider's discretion.    Again, thank you for choosing [facility].  Our hope is that these requests will decrease the amount of time that you wait before being seen by our physicians.   If you have a lab appointment with the [facility] please come in thru the Main Entrance and check in at the main information desk.           _____________________________________________________________  Should you have questions after your visit to [facility], please contact our office at [phone] and follow the prompts.  Our office hours are 8:00 a.m. to 4:30 p.m. Monday - Thursday and 8:00 a.m. to  2:30 p.m. Friday.  Please note that voicemails left after 4:00 p.m. may not be returned until the following business day.  We are closed weekends and all major holidays.  You do have access to a nurse 24-7, just call the main number to the clinic [phone] and do not press any options, hold on the line and a nurse will answer the phone.    For prescription refill requests, have your pharmacy contact our office and allow 72 hours.    Masks are optional in the cancer centers. If you would like for your care team to wear a mask while they are taking care of you, please let them know. You may have one support person who is at least 81 years old accompany you for your appointments.
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
## 2022-11-14 ENCOUNTER — Encounter: Payer: Medicare Other | Admitting: Physical Medicine & Rehabilitation

## 2022-11-14 LAB — KAPPA/LAMBDA LIGHT CHAINS
Kappa free light chain: 92.6 mg/L — ABNORMAL HIGH (ref 3.3–19.4)
Kappa, lambda light chain ratio: 1.83 — ABNORMAL HIGH (ref 0.26–1.65)
Lambda free light chains: 50.5 mg/L — ABNORMAL HIGH (ref 5.7–26.3)

## 2022-11-15 LAB — METHYLMALONIC ACID, SERUM: Methylmalonic Acid, Quantitative: 286 nmol/L (ref 0–378)

## 2022-11-15 LAB — HAPTOGLOBIN: Haptoglobin: 165 mg/dL (ref 34–355)

## 2022-11-17 ENCOUNTER — Other Ambulatory Visit: Payer: Self-pay

## 2022-11-17 ENCOUNTER — Other Ambulatory Visit (HOSPITAL_COMMUNITY): Admission: RE | Admit: 2022-11-17 | Payer: Medicare Other | Source: Home / Self Care

## 2022-11-17 DIAGNOSIS — D649 Anemia, unspecified: Secondary | ICD-10-CM

## 2022-11-17 DIAGNOSIS — D539 Nutritional anemia, unspecified: Secondary | ICD-10-CM

## 2022-11-17 DIAGNOSIS — D509 Iron deficiency anemia, unspecified: Secondary | ICD-10-CM | POA: Diagnosis not present

## 2022-11-17 LAB — PROTEIN ELECTROPHORESIS, SERUM
A/G Ratio: 0.9 (ref 0.7–1.7)
Albumin ELP: 3.6 g/dL (ref 2.9–4.4)
Alpha-1-Globulin: 0.3 g/dL (ref 0.0–0.4)
Alpha-2-Globulin: 0.9 g/dL (ref 0.4–1.0)
Beta Globulin: 1.2 g/dL (ref 0.7–1.3)
Gamma Globulin: 1.5 g/dL (ref 0.4–1.8)
Globulin, Total: 3.9 g/dL (ref 2.2–3.9)
Total Protein ELP: 7.5 g/dL (ref 6.0–8.5)

## 2022-11-17 LAB — COPPER, SERUM: Copper: 147 ug/dL — ABNORMAL HIGH (ref 69–132)

## 2022-11-17 LAB — OCCULT BLOOD X 1 CARD TO LAB, STOOL
Fecal Occult Bld: NEGATIVE
Fecal Occult Bld: NEGATIVE
Fecal Occult Bld: NEGATIVE
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
## 2022-11-20 ENCOUNTER — Inpatient Hospital Stay (HOSPITAL_BASED_OUTPATIENT_CLINIC_OR_DEPARTMENT_OTHER): Payer: Medicare Other | Admitting: Physician Assistant

## 2022-11-20 ENCOUNTER — Encounter: Payer: Self-pay | Admitting: Physician Assistant

## 2022-11-20 VITALS — BP 139/66 | HR 66 | Temp 97.8°F | Resp 18 | Wt 231.0 lb

## 2022-11-20 DIAGNOSIS — D649 Anemia, unspecified: Secondary | ICD-10-CM | POA: Diagnosis not present

## 2022-11-20 DIAGNOSIS — D509 Iron deficiency anemia, unspecified: Secondary | ICD-10-CM | POA: Diagnosis not present

## 2022-11-20 DIAGNOSIS — D500 Iron deficiency anemia secondary to blood loss (chronic): Secondary | ICD-10-CM | POA: Diagnosis not present

## 2022-11-20 DIAGNOSIS — D539 Nutritional anemia, unspecified: Secondary | ICD-10-CM | POA: Diagnosis not present

## 2022-11-20 NOTE — Patient Instructions (Signed)
[facility] **   You were seen today by Tarri Abernethy PA-C for your iron deficiency anemia.   We will schedule you for IV iron x 2 doses.  Please see attached handout for important information regarding IV iron infusions. Will check labs and see you for follow-up visit in about 2 months.  ** Thank you for trusting me with your healthcare!  I strive to provide all of my patients with quality care at each visit.  If you receive a survey for this visit, I would be so grateful to you for taking the time to provide feedback.  Thank you in advance!  ~ Ladeana Laplant                   Dr. Derek Jack   &   Tarri Abernethy, PA-C   - - - - - - - - - - - - - - - - - -    Thank you for choosing [facility] to provide your oncology and hematology care.  To afford each patient quality time with our provider, please arrive at least 15 minutes before your scheduled appointment time.   If you have a lab appointment with the [facility] please come in thru the Main Entrance and check in at the main information desk.  You need to re-schedule your appointment should you arrive 10 or more minutes late.  We strive to give you quality time with our providers, and arriving late affects you and other patients whose appointments are after yours.  Also, if you no show three or more times for appointments you may be dismissed from the clinic at the providers discretion.     Again, thank you for choosing [facility].  Our hope is that these requests will decrease the amount of time that you wait before being seen by our physicians.       _____________________________________________________________  Should you have questions after your visit to [facility], please contact our office at [phone] and follow the prompts.  Our office hours are 8:00 a.m. and 4:30 p.m.  Monday - Friday.  Please note that voicemails left after 4:00 p.m. may not be returned until the following business day.  We are closed weekends and major holidays.  You do have access to a nurse 24-7, just call the main number to the clinic [phone] and do not press any options, hold on the line and a nurse will answer the phone.    For prescription refill requests, have your pharmacy contact our office and allow 72 hours.

## 2022-11-21 ENCOUNTER — Encounter: Payer: Self-pay | Admitting: Physician Assistant

## 2022-11-21 ENCOUNTER — Encounter: Payer: Self-pay | Admitting: Hematology

## 2022-11-21 DIAGNOSIS — D500 Iron deficiency anemia secondary to blood loss (chronic): Secondary | ICD-10-CM | POA: Insufficient documentation

## 2022-11-21 HISTORY — DX: Iron deficiency anemia secondary to blood loss (chronic): D50.0

## 2022-11-27 ENCOUNTER — Inpatient Hospital Stay: Payer: Medicare Other

## 2022-11-27 ENCOUNTER — Inpatient Hospital Stay: Payer: Medicare Other | Admitting: Licensed Clinical Social Worker

## 2022-11-27 VITALS — BP 117/65 | HR 56 | Temp 98.0°F | Resp 18

## 2022-11-27 DIAGNOSIS — D500 Iron deficiency anemia secondary to blood loss (chronic): Secondary | ICD-10-CM

## 2022-11-27 DIAGNOSIS — D509 Iron deficiency anemia, unspecified: Secondary | ICD-10-CM | POA: Diagnosis not present

## 2022-11-27 MED ORDER — ACETAMINOPHEN 325 MG PO TABS: 650.0000 mg | ORAL_TABLET | Freq: Once | ORAL | Status: DC

## 2022-11-27 MED ORDER — SODIUM CHLORIDE 0.9 % IV SOLN
510.0000 mg | Freq: Once | INTRAVENOUS | Status: AC
  Administered 2022-11-27: 510 mg via INTRAVENOUS
  Filled 2022-11-27: qty 510

## 2022-11-27 MED ORDER — SODIUM CHLORIDE 0.9 % IV SOLN: Freq: Once | INTRAVENOUS | Status: AC

## 2022-11-27 MED ORDER — LORATADINE 10 MG PO TABS
10.0000 mg | ORAL_TABLET | Freq: Once | ORAL | Status: AC
  Administered 2022-11-27: 10 mg via ORAL
  Filled 2022-11-27: qty 1

## 2022-11-27 NOTE — Progress Notes (Signed)
Feraheme given today per MD orders. Tolerated infusion without adverse affects. Vital signs stable. No complaints at this time. Discharged from clinic ambulatory in stable condition. Alert and oriented x 3. F/U with [facility] as scheduled.  

## 2022-11-27 NOTE — Patient Instructions (Signed)
[place]  Discharge Instructions: Thank you for choosing [facility] to provide your oncology and hematology care.  If you have a lab appointment with the [facility], please come in thru the Main Entrance and check in at the main information desk.  Wear comfortable clothing and clothing appropriate for easy access to any Portacath or PICC line.   We strive to give you quality time with your provider. You may need to reschedule your appointment if you arrive late (15 or more minutes).  Arriving late affects you and other patients whose appointments are after yours.  Also, if you miss three or more appointments without notifying the office, you may be dismissed from the clinic at the provider's discretion.      For prescription refill requests, have your pharmacy contact our office and allow 72 hours for refills to be completed.    Today you received the following chemotherapy and/or immunotherapy agents Feraheme. Ferumoxytol Injection What is this medication? FERUMOXYTOL (FER ue MOX i tol) treats low levels of iron in your body (iron deficiency anemia). Iron is a mineral that plays an important role in making red blood cells, which carry oxygen from your lungs to the rest of your body. This medicine may be used for other purposes; ask your health care provider or pharmacist if you have questions. COMMON BRAND NAME(S): Feraheme What should I tell my care team before I take this medication? They need to know if you have any of these conditions: Anemia not caused by low iron levels High levels of iron in the blood Magnetic resonance imaging (MRI) test scheduled An unusual or allergic reaction to iron, other medications, foods, dyes, or preservatives Pregnant or trying to get pregnant Breastfeeding How should I use this medication? This medication is injected into a vein. It is given by your care team in a hospital or clinic setting. Talk to your care  team the use of this medication in children. Special care may be needed. Overdosage: If you think you have taken too much of this medicine contact a poison control center or emergency room at once. NOTE: This medicine is only for you. Do not share this medicine with others. What if I miss a dose? It is important not to miss your dose. Call your care team if you are unable to keep an appointment. What may interact with this medication? Other iron products This list may not describe all possible interactions. Give your health care provider a list of all the medicines, herbs, non-prescription drugs, or dietary supplements you use. Also tell them if you smoke, drink alcohol, or use illegal drugs. Some items may interact with your medicine. What should I watch for while using this medication? Visit your care team regularly. Tell your care team if your symptoms do not start to get better or if they get worse. You may need blood work done while you are taking this medication. You may need to follow a special diet. Talk to your care team. Foods that contain iron include: whole grains/cereals, dried fruits, beans, or peas, leafy green vegetables, and organ meats (liver, kidney). What side effects may I notice from receiving this medication? Side effects that you should report to your care team as soon as possible: Allergic reactions--skin rash, itching, hives, swelling of the face, lips, tongue, or throat Low blood pressure--dizziness, feeling faint or lightheaded, blurry vision Shortness of breath Side effects that usually do not require medical attention (report to your  care team if they continue or are bothersome): Flushing Headache Joint pain Muscle pain Nausea Pain, redness, or irritation at injection site This list may not describe all possible side effects. Call your doctor for medical advice about side effects. You may report side effects to FDA at 1-800-FDA-1088. Where should I keep my  medication? This medication is given in a hospital or clinic and will not be stored at home. NOTE: This sheet is a summary. It may not cover all possible information. If you have questions about this medicine, talk to your doctor, pharmacist, or health care provider.  2023 Elsevier/Gold Standard (2021-03-20 00:00:00)       To help prevent nausea and vomiting after your treatment, we encourage you to take your nausea medication as directed.  BELOW ARE SYMPTOMS THAT SHOULD BE REPORTED IMMEDIATELY: *FEVER GREATER THAN 100.4 F (38 C) OR HIGHER *CHILLS OR SWEATING *NAUSEA AND VOMITING THAT IS NOT CONTROLLED WITH YOUR NAUSEA MEDICATION *UNUSUAL SHORTNESS OF BREATH *UNUSUAL BRUISING OR BLEEDING *URINARY PROBLEMS (pain or burning when urinating, or frequent urination) *BOWEL PROBLEMS (unusual diarrhea, constipation, pain near the anus) TENDERNESS IN MOUTH AND THROAT WITH OR WITHOUT PRESENCE OF ULCERS (sore throat, sores in mouth, or a toothache) UNUSUAL RASH, SWELLING OR PAIN  UNUSUAL VAGINAL DISCHARGE OR ITCHING   Items with * indicate a potential emergency and should be followed up as soon as possible or go to the Emergency Department if any problems should occur.  Please show the CHEMOTHERAPY ALERT CARD or IMMUNOTHERAPY ALERT CARD at check-in to the Emergency Department and triage nurse.  Should you have questions after your visit or need to cancel or reschedule your appointment, please contact [facility] [phone]  and follow the prompts.  Office hours are 8:00 a.m. to 4:30 p.m. Monday - Friday. Please note that voicemails left after 4:00 p.m. may not be returned until the following business day.  We are closed weekends and major holidays. You have access to a nurse at all times for urgent questions. Please call the main number to the clinic [phone] and follow the prompts.  For any non-urgent questions, you may also contact your provider using MyChart. We now  offer e-Visits for anyone 18 and older to request care online for non-urgent symptoms. For details visit [email or web address].   Also download the MyChart app! Go to the app store, search "MyChart", open the app, select , and log in with your MyChart username and password.   

## 2022-12-03 ENCOUNTER — Encounter (HOSPITAL_COMMUNITY): Payer: Self-pay | Admitting: *Deleted

## 2022-12-03 NOTE — Progress Notes (Signed)
Patient called to request cancellation of Social Work appointment.  States he has no needs at this time.

## 2022-12-04 ENCOUNTER — Other Ambulatory Visit: Payer: No Typology Code available for payment source | Admitting: Licensed Clinical Social Worker

## 2022-12-05 ENCOUNTER — Inpatient Hospital Stay: Payer: Medicare Other

## 2022-12-05 VITALS — BP 127/58 | HR 58 | Temp 98.4°F | Resp 16

## 2022-12-05 DIAGNOSIS — D509 Iron deficiency anemia, unspecified: Secondary | ICD-10-CM | POA: Diagnosis not present

## 2022-12-05 DIAGNOSIS — D500 Iron deficiency anemia secondary to blood loss (chronic): Secondary | ICD-10-CM

## 2022-12-05 MED ORDER — SODIUM CHLORIDE 0.9 % IV SOLN: Freq: Once | INTRAVENOUS | Status: AC

## 2022-12-05 MED ORDER — LORATADINE 10 MG PO TABS: 10.0000 mg | ORAL_TABLET | Freq: Once | ORAL | Status: DC

## 2022-12-05 MED ORDER — ACETAMINOPHEN 325 MG PO TABS: 650.0000 mg | ORAL_TABLET | Freq: Once | ORAL | Status: DC

## 2022-12-05 MED ORDER — SODIUM CHLORIDE 0.9 % IV SOLN
510.0000 mg | Freq: Once | INTRAVENOUS | Status: AC
  Administered 2022-12-05: 510 mg via INTRAVENOUS
  Filled 2022-12-05: qty 510

## 2022-12-05 NOTE — Patient Instructions (Signed)
[facility]  Discharge Instructions: Thank you for choosing [facility] to provide your oncology and hematology care.  If you have a lab appointment with the Cancer Center, please come in thru the Main Entrance and check in at the main information desk.  Wear comfortable clothing and clothing appropriate for easy access to any Portacath or PICC line.   We strive to give you quality time with your provider. You may need to reschedule your appointment if you arrive late (15 or more minutes).  Arriving late affects you and other patients whose appointments are after yours.  Also, if you miss three or more appointments without notifying the office, you may be dismissed from the clinic at the provider's discretion.      For prescription refill requests, have your pharmacy contact our office and allow 72 hours for refills to be completed.    Today you received the following chemotherapy and/or immunotherapy agents Feraheme      To help prevent nausea and vomiting after your treatment, we encourage you to take your nausea medication as directed.  BELOW ARE SYMPTOMS THAT SHOULD BE REPORTED IMMEDIATELY: *FEVER GREATER THAN 100.4 F (38 C) OR HIGHER *CHILLS OR SWEATING *NAUSEA AND VOMITING THAT IS NOT CONTROLLED WITH YOUR NAUSEA MEDICATION *UNUSUAL SHORTNESS OF BREATH *UNUSUAL BRUISING OR BLEEDING *URINARY PROBLEMS (pain or burning when urinating, or frequent urination) *BOWEL PROBLEMS (unusual diarrhea, constipation, pain near the anus) TENDERNESS IN MOUTH AND THROAT WITH OR WITHOUT PRESENCE OF ULCERS (sore throat, sores in mouth, or a toothache) UNUSUAL RASH, SWELLING OR PAIN  UNUSUAL VAGINAL DISCHARGE OR ITCHING   Items with * indicate a potential emergency and should be followed up as soon as possible or go to the Emergency Department if any problems should occur.  Please show the CHEMOTHERAPY ALERT CARD or IMMUNOTHERAPY ALERT CARD at check-in to the  Emergency Department and triage nurse.  Should you have questions after your visit or need to cancel or reschedule your appointment, please contact [facility] [phone]  and follow the prompts.  Office hours are 8:00 a.m. to 4:30 p.m. Monday - Friday. Please note that voicemails left after 4:00 p.m. may not be returned until the following business day.  We are closed weekends and major holidays. You have access to a nurse at all times for urgent questions. Please call the main number to the clinic [phone] and follow the prompts.  For any non-urgent questions, you may also contact your provider using MyChart. We now offer e-Visits for anyone 18 and older to request care online for non-urgent symptoms. For details visit [email or web address].   Also download the MyChart app! Go to the app store, search "MyChart", open the app, select [facility], and log in with your MyChart username and password.   

## 2022-12-05 NOTE — Progress Notes (Signed)
Patient presents today for Feraheme infusion per providers order.  Vital signs WNL.  Patient has no new complaints at this time.  Peripheral IV started and blood return noted pre and post infusion.    Stable during infusion without adverse affects.  Vital signs stable.  No complaints at this time.  Discharge from clinic ambulatory in stable condition.  Alert and oriented X 3.  Follow up with [facility] as scheduled.  

## 2022-12-09 ENCOUNTER — Ambulatory Visit (INDEPENDENT_AMBULATORY_CARE_PROVIDER_SITE_OTHER): Payer: No Typology Code available for payment source | Admitting: Internal Medicine

## 2022-12-09 ENCOUNTER — Encounter: Payer: Self-pay | Admitting: Internal Medicine

## 2022-12-09 ENCOUNTER — Telehealth: Payer: Self-pay | Admitting: *Deleted

## 2022-12-09 ENCOUNTER — Ambulatory Visit (HOSPITAL_COMMUNITY)
Admission: RE | Admit: 2022-12-09 | Discharge: 2022-12-09 | Disposition: A | Discharge status: Home / Self Care | Payer: Medicare Other | Source: Ambulatory Visit | Attending: Internal Medicine | Admitting: Internal Medicine

## 2022-12-09 ENCOUNTER — Other Ambulatory Visit: Payer: Self-pay | Admitting: *Deleted

## 2022-12-09 VITALS — BP 145/65 | HR 73 | Temp 97.9°F | Ht 72.0 in | Wt 234.4 lb

## 2022-12-09 DIAGNOSIS — I729 Aneurysm of unspecified site: Secondary | ICD-10-CM | POA: Diagnosis not present

## 2022-12-09 DIAGNOSIS — D539 Nutritional anemia, unspecified: Secondary | ICD-10-CM

## 2022-12-09 MED ORDER — IOHEXOL 350 MG/ML SOLN
80.0000 mL | Freq: Once | INTRAVENOUS | Status: AC | PRN
  Administered 2022-12-09: 80 mL via INTRAVENOUS

## 2022-12-09 NOTE — Telephone Encounter (Signed)
No PA needed for CT . Reference #12/09/22 Ulice Brilliant.
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
## 2022-12-09 NOTE — Patient Instructions (Signed)
It was good to see you again today!  I agree with the prior diagnosis of diverticulitis.  You have taken 4 days of a 10-day course of Augmentin.  Based on your marked improvement in the past 4 days I think you are on the right track with your acute illness.  Continue taking Augmentin and finish the entire prescription  Low residue diet while on antibiotics  We will obtain a CT angiogram of the abdomen to assess for poor blood flow to your intestines and reassess your aneurysm.  Your blood pressure was elevated today.  It would be rechecked.  Stop taking omeprazole  Protonix or pantoprazole 40 mg twice daily 30 minutes before breakfast and supper should control reflux symptoms  Further recommendations to follow once CT scan has been done

## 2022-12-09 NOTE — Telephone Encounter (Signed)
[employer] [facility] [employer] : No

## 2022-12-29 ENCOUNTER — Other Ambulatory Visit: Payer: Self-pay | Admitting: Cardiology

## 2023-01-06 ENCOUNTER — Ambulatory Visit (INDEPENDENT_AMBULATORY_CARE_PROVIDER_SITE_OTHER): Payer: No Typology Code available for payment source | Admitting: Family Medicine

## 2023-01-06 ENCOUNTER — Encounter: Payer: Self-pay | Admitting: Family Medicine

## 2023-01-06 VITALS — BP 108/59 | HR 64 | Temp 96.4°F | Ht 67.5 in | Wt 229.8 lb

## 2023-01-06 DIAGNOSIS — I152 Hypertension secondary to endocrine disorders: Secondary | ICD-10-CM

## 2023-01-06 DIAGNOSIS — D500 Iron deficiency anemia secondary to blood loss (chronic): Secondary | ICD-10-CM

## 2023-01-06 DIAGNOSIS — E119 Type 2 diabetes mellitus without complications: Secondary | ICD-10-CM

## 2023-01-06 DIAGNOSIS — I480 Paroxysmal atrial fibrillation: Secondary | ICD-10-CM | POA: Diagnosis not present

## 2023-01-06 DIAGNOSIS — Z794 Long term (current) use of insulin: Secondary | ICD-10-CM

## 2023-01-06 DIAGNOSIS — E785 Hyperlipidemia, unspecified: Secondary | ICD-10-CM

## 2023-01-06 DIAGNOSIS — E1169 Type 2 diabetes mellitus with other specified complication: Secondary | ICD-10-CM | POA: Diagnosis not present

## 2023-01-06 DIAGNOSIS — E1159 Type 2 diabetes mellitus with other circulatory complications: Secondary | ICD-10-CM

## 2023-01-06 NOTE — Patient Instructions (Signed)
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
## 2023-01-07 LAB — LIPID PANEL
Chol/HDL Ratio: 3.5 ratio (ref 0.0–5.0)
Cholesterol, Total: 118 mg/dL (ref 100–199)
HDL: 34 mg/dL — ABNORMAL LOW (ref >39)
LDL Chol Calc (NIH): 54 mg/dL (ref 0–99)
Triglycerides: 178 mg/dL — ABNORMAL HIGH (ref 0–149)
VLDL Cholesterol Cal: 30 mg/dL (ref 5–40)

## 2023-01-07 LAB — ANEMIA PROFILE B
Basophils Absolute: 0.1 10*3/uL (ref 0.0–0.2)
Basos: 1 %
EOS (ABSOLUTE): 0.1 10*3/uL (ref 0.0–0.4)
Eos: 1 %
Ferritin: 603 ng/mL — ABNORMAL HIGH (ref 30–400)
Folate: 3.5 ng/mL (ref >3.0)
Hematocrit: 28.5 % — ABNORMAL LOW (ref 37.5–51.0)
Hemoglobin: 9.3 g/dL — ABNORMAL LOW (ref 13.0–17.7)
Immature Grans (Abs): 0.5 10*3/uL — ABNORMAL HIGH (ref 0.0–0.1)
Immature Granulocytes: 5 %
Iron Saturation: 33 % (ref 15–55)
Iron: 135 ug/dL (ref 38–169)
Lymphocytes Absolute: 2.3 10*3/uL (ref 0.7–3.1)
Lymphs: 25 %
MCH: 33.1 pg — ABNORMAL HIGH (ref 26.6–33.0)
MCHC: 32.6 g/dL (ref 31.5–35.7)
MCV: 101 fL — ABNORMAL HIGH (ref 79–97)
Monocytes Absolute: 2.3 10*3/uL — ABNORMAL HIGH (ref 0.1–0.9)
Monocytes: 26 %
Neutrophils Absolute: 3.8 10*3/uL (ref 1.4–7.0)
Neutrophils: 42 %
Platelets: 183 10*3/uL (ref 150–450)
RBC: 2.81 x10E6/uL — ABNORMAL LOW (ref 4.14–5.80)
RDW: 16 % — ABNORMAL HIGH (ref 11.6–15.4)
Retic Ct Pct: 8 % — ABNORMAL HIGH (ref 0.6–2.6)
Total Iron Binding Capacity: 414 ug/dL (ref 250–450)
UIBC: 279 ug/dL (ref 111–343)
Vitamin B-12: 617 pg/mL (ref 232–1245)
WBC: 9 10*3/uL (ref 3.4–10.8)

## 2023-01-07 LAB — CMP14+EGFR
ALT: 6 IU/L (ref 0–44)
AST: 11 IU/L (ref 0–40)
Albumin/Globulin Ratio: 1.3 (ref 1.2–2.2)
Albumin: 4.5 g/dL (ref 3.8–4.8)
Alkaline Phosphatase: 55 IU/L (ref 44–121)
BUN/Creatinine Ratio: 12 (ref 10–24)
BUN: 16 mg/dL (ref 8–27)
Bilirubin Total: 1.3 mg/dL — ABNORMAL HIGH (ref 0.0–1.2)
CO2: 22 mmol/L (ref 20–29)
Calcium: 9.2 mg/dL (ref 8.6–10.2)
Chloride: 99 mmol/L (ref 96–106)
Creatinine, Ser: 1.32 mg/dL — ABNORMAL HIGH (ref 0.76–1.27)
Globulin, Total: 3.4 g/dL (ref 1.5–4.5)
Glucose: 121 mg/dL — ABNORMAL HIGH (ref 70–99)
Potassium: 4.7 mmol/L (ref 3.5–5.2)
Sodium: 138 mmol/L (ref 134–144)
Total Protein: 7.9 g/dL (ref 6.0–8.5)
eGFR: 55 mL/min/{1.73_m2} — ABNORMAL LOW (ref >59)

## 2023-01-07 LAB — THYROID PANEL WITH TSH
Free Thyroxine Index: 2 (ref 1.2–4.9)
T3 Uptake Ratio: 26 % (ref 24–39)
T4, Total: 7.7 ug/dL (ref 4.5–12.0)
TSH: 3.29 u[IU]/mL (ref 0.450–4.500)

## 2023-01-07 LAB — BAYER DCA HB A1C WAIVED: HB A1C (BAYER DCA - WAIVED): 5.9 % — ABNORMAL HIGH (ref 4.8–5.6)

## 2023-01-08 ENCOUNTER — Encounter: Payer: Self-pay | Admitting: Orthopaedic Surgery

## 2023-01-08 ENCOUNTER — Ambulatory Visit (INDEPENDENT_AMBULATORY_CARE_PROVIDER_SITE_OTHER): Payer: No Typology Code available for payment source | Admitting: Orthopaedic Surgery

## 2023-01-08 ENCOUNTER — Other Ambulatory Visit: Payer: Self-pay | Admitting: Family Medicine

## 2023-01-08 ENCOUNTER — Ambulatory Visit (INDEPENDENT_AMBULATORY_CARE_PROVIDER_SITE_OTHER): Payer: No Typology Code available for payment source

## 2023-01-08 VITALS — Ht 67.5 in | Wt 229.0 lb

## 2023-01-08 DIAGNOSIS — M25561 Pain in right knee: Secondary | ICD-10-CM | POA: Diagnosis not present

## 2023-01-08 DIAGNOSIS — M1711 Unilateral primary osteoarthritis, right knee: Secondary | ICD-10-CM | POA: Diagnosis not present

## 2023-01-08 DIAGNOSIS — E1159 Type 2 diabetes mellitus with other circulatory complications: Secondary | ICD-10-CM

## 2023-01-08 LAB — MICROALBUMIN / CREATININE URINE RATIO
Creatinine, Urine: 142.6 mg/dL
Microalb/Creat Ratio: 37 mg/g creat — ABNORMAL HIGH (ref 0–29)
Microalbumin, Urine: 52.2 ug/mL

## 2023-01-08 MED ORDER — LISINOPRIL 5 MG PO TABS: 5.0000 mg | ORAL_TABLET | Freq: Every day | ORAL | 3 refills | Status: DC
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
## 2023-01-13 ENCOUNTER — Telehealth: Payer: Self-pay | Admitting: Cardiology

## 2023-01-13 NOTE — Telephone Encounter (Signed)
   Pre-operative Risk Assessment    Patient Name: David Duarte  DOB: 05/17/1941 MRN: [id number]      Request for Surgical Clearance    Procedure:   R total knee arthroplasty  Date of Surgery:  Clearance TBD                                 Surgeon:  Not indicated Surgeon's Group or Practice Name:  [facility] Phone number:  [phone] Fax number:  [phone] attn: Dondra Spry   Type of Clearance Requested:   - Pharmacy:  Hold need blood thinner instructions      Type of Anesthesia:  Spinal + block   Additional requests/questions:    SignedHipolito Bayley   01/13/2023, 4:49 PM

## 2023-01-14 ENCOUNTER — Ambulatory Visit: Payer: Medicare Other | Attending: Nurse Practitioner | Admitting: Nurse Practitioner

## 2023-01-14 ENCOUNTER — Encounter: Payer: Self-pay | Admitting: Nurse Practitioner

## 2023-01-14 VITALS — BP 122/58 | HR 64 | Ht 72.0 in | Wt 229.8 lb

## 2023-01-14 DIAGNOSIS — E119 Type 2 diabetes mellitus without complications: Secondary | ICD-10-CM | POA: Diagnosis present

## 2023-01-14 DIAGNOSIS — I10 Essential (primary) hypertension: Secondary | ICD-10-CM

## 2023-01-14 DIAGNOSIS — D539 Nutritional anemia, unspecified: Secondary | ICD-10-CM | POA: Diagnosis not present

## 2023-01-14 DIAGNOSIS — I480 Paroxysmal atrial fibrillation: Secondary | ICD-10-CM

## 2023-01-14 DIAGNOSIS — I7143 Infrarenal abdominal aortic aneurysm, without rupture: Secondary | ICD-10-CM

## 2023-01-14 DIAGNOSIS — E782 Mixed hyperlipidemia: Secondary | ICD-10-CM

## 2023-01-14 DIAGNOSIS — Z01818 Encounter for other preprocedural examination: Secondary | ICD-10-CM | POA: Diagnosis not present

## 2023-01-14 DIAGNOSIS — Z794 Long term (current) use of insulin: Secondary | ICD-10-CM

## 2023-01-14 DIAGNOSIS — R9439 Abnormal result of other cardiovascular function study: Secondary | ICD-10-CM | POA: Diagnosis not present

## 2023-01-14 NOTE — Telephone Encounter (Signed)
I received a request for right total knee arthroplasty today in preop.  He was seen by you in clinic today.  Pharmacy has returned recommendations for holding anticoagulation.  Would you be able to comment on preoperative cardiac evaluation?  Thank you for your help.  Jossie Ng. Wanona Stare NP-C     01/14/2023, 4:28 PM [facility] [address] Office [phone] Fax [phone]
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
## 2023-01-14 NOTE — Telephone Encounter (Signed)
In the setting of prior abnormal stress test with ongoing dyspnea on exertion, patient has been scheduled for diagnostic catheterization.  Further preoperative recommendations pending catheterization results.  Marquette Old 01/14/2023, 4:30 PM
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
## 2023-01-14 NOTE — Patient Instructions (Signed)
Medication Instructions:  Your physician recommends that you continue on your current medications as directed. Please refer to the Current Medication list given to you today.   *If you need a refill on your cardiac medications before your next appointment, please call your pharmacy*   Lab Work: BMET CBC  If you have labs (blood work) drawn today and your tests are completely normal, you will receive your results only by: [place] (if you have MyChart) OR A paper copy in the mail If you have any lab test that is abnormal or we need to change your treatment, we will call you to review the results.   Testing/Procedures: R/L Heart Cath   Follow-Up: At [facility], you and your health needs are our priority.  As part of our continuing mission to provide you with exceptional heart care, we have created designated Provider Care Teams.  These Care Teams include your primary Cardiologist (physician) and Advanced Practice Providers (APPs -  Physician Assistants and Nurse Practitioners) who all work together to provide you with the care you need, when you need it.  We recommend signing up for the patient portal called "MyChart".  Sign up information is provided on this After Visit Summary.  MyChart is used to connect with patients for Virtual Visits (Telemedicine).  Patients are able to view lab/test results, encounter notes, upcoming appointments, etc.  Non-urgent messages can be sent to your provider as well.   To learn more about what you can do with MyChart, go to [email or web address].    Your next appointment:   2 week(s)  Provider:   Rozann Lesches, MD    Other Instructions   [place] [facility] DEPT OF [place] [id number] [place] [address] Dept: [phone] Loc: [place]  01/14/2023  You are scheduled for a Cardiac Catheterization on Monday, March 11  with Dr. Harrell Gave End.  1. Please arrive at the [facility] (Main Entrance A) at [facility]: [place], [place] [address] at 7:00 AM (This time is two hours before your procedure to ensure your preparation). Free valet parking service is available.   Special note: Every effort is made to have your procedure done on time. Please understand that emergencies sometimes delay scheduled procedures.  2. Diet: Do not eat solid foods after midnight.  The patient may have clear liquids until 5am upon the day of the procedure.  3. Labs: You will need to have blood drawn on TODAY You do not need to be fasting.  4. Medication instructions in preparation for your procedure:   Contrast Allergy: No  Stop taking Eliquis (Apixiban) on Saturday, March 9.  Do Not take Insulin the day of cath.  On the morning of your procedure, take your Aspirin 81 mg and any morning medicines NOT listed above.  You may use sips of water.  5. Plan for one night stay--bring personal belongings. 6. Bring a current list of your medications and current insurance cards. 7. You MUST have a responsible person to drive you home. 8. Someone MUST be with you the first 24 hours after you arrive home or your discharge will be delayed. 9. Please wear clothes that are easy to get on and off and wear slip-on shoes.  Thank you for allowing [place] to care for you!   -- [place] [facility]

## 2023-01-14 NOTE — Telephone Encounter (Signed)
Patient with diagnosis of afib on Eliquis for anticoagulation.    Procedure: right TKA Date of procedure: TBD  CHA2DS2-VASc Score = 6  This indicates a 9.7% annual risk of stroke. The patient's score is based upon: CHF History: 1 HTN History: 1 Diabetes History: 1 Stroke History: 0 Vascular Disease History: 1 Age Score: 2 Gender Score: 0   PE in 2003, afib dx in 2022.  CrCl 29m/min Platelet count 140K  Per office protocol, patient can hold Eliquis for 3 days prior to procedure.    **This guidance is not considered finalized until pre-operative APP has relayed final recommendations.**

## 2023-01-15 ENCOUNTER — Inpatient Hospital Stay: Payer: Medicare Other | Attending: Hematology

## 2023-01-15 ENCOUNTER — Telehealth: Payer: Self-pay | Admitting: *Deleted

## 2023-01-15 ENCOUNTER — Other Ambulatory Visit: Payer: Self-pay

## 2023-01-15 DIAGNOSIS — E611 Iron deficiency: Secondary | ICD-10-CM | POA: Insufficient documentation

## 2023-01-15 DIAGNOSIS — D539 Nutritional anemia, unspecified: Secondary | ICD-10-CM

## 2023-01-15 DIAGNOSIS — E1122 Type 2 diabetes mellitus with diabetic chronic kidney disease: Secondary | ICD-10-CM | POA: Diagnosis not present

## 2023-01-15 DIAGNOSIS — D509 Iron deficiency anemia, unspecified: Secondary | ICD-10-CM | POA: Diagnosis present

## 2023-01-15 DIAGNOSIS — I480 Paroxysmal atrial fibrillation: Secondary | ICD-10-CM | POA: Diagnosis not present

## 2023-01-15 DIAGNOSIS — Z7901 Long term (current) use of anticoagulants: Secondary | ICD-10-CM | POA: Insufficient documentation

## 2023-01-15 DIAGNOSIS — Z86711 Personal history of pulmonary embolism: Secondary | ICD-10-CM | POA: Diagnosis not present

## 2023-01-15 DIAGNOSIS — K7460 Unspecified cirrhosis of liver: Secondary | ICD-10-CM | POA: Insufficient documentation

## 2023-01-15 DIAGNOSIS — D631 Anemia in chronic kidney disease: Secondary | ICD-10-CM | POA: Insufficient documentation

## 2023-01-15 DIAGNOSIS — Z87891 Personal history of nicotine dependence: Secondary | ICD-10-CM | POA: Insufficient documentation

## 2023-01-15 DIAGNOSIS — D500 Iron deficiency anemia secondary to blood loss (chronic): Secondary | ICD-10-CM

## 2023-01-15 DIAGNOSIS — Z79899 Other long term (current) drug therapy: Secondary | ICD-10-CM | POA: Diagnosis not present

## 2023-01-15 DIAGNOSIS — D649 Anemia, unspecified: Secondary | ICD-10-CM

## 2023-01-15 DIAGNOSIS — D696 Thrombocytopenia, unspecified: Secondary | ICD-10-CM | POA: Insufficient documentation

## 2023-01-15 DIAGNOSIS — N1832 Chronic kidney disease, stage 3b: Secondary | ICD-10-CM | POA: Insufficient documentation

## 2023-01-15 LAB — CBC WITH DIFFERENTIAL/PLATELET
Abs Immature Granulocytes: 0.38 10*3/uL — ABNORMAL HIGH (ref 0.00–0.07)
Basophils Absolute: 0 10*3/uL (ref 0.0–0.1)
Basophils Relative: 0 %
Eosinophils Absolute: 0.1 10*3/uL (ref 0.0–0.5)
Eosinophils Relative: 2 %
HCT: 26.5 % — ABNORMAL LOW (ref 39.0–52.0)
Hemoglobin: 8 g/dL — ABNORMAL LOW (ref 13.0–17.0)
Immature Granulocytes: 5 %
Lymphocytes Relative: 19 %
Lymphs Abs: 1.4 10*3/uL (ref 0.7–4.0)
MCH: 32.7 pg (ref 26.0–34.0)
MCHC: 30.2 g/dL (ref 30.0–36.0)
MCV: 108.2 fL — ABNORMAL HIGH (ref 80.0–100.0)
Monocytes Absolute: 2 10*3/uL — ABNORMAL HIGH (ref 0.1–1.0)
Monocytes Relative: 27 %
Neutro Abs: 3.4 10*3/uL (ref 1.7–7.7)
Neutrophils Relative %: 47 %
Platelets: 152 10*3/uL (ref 150–400)
RBC: 2.45 MIL/uL — ABNORMAL LOW (ref 4.22–5.81)
RDW: 17.8 % — ABNORMAL HIGH (ref 11.5–15.5)
WBC: 7.2 10*3/uL (ref 4.0–10.5)
nRBC: 0 % (ref 0.0–0.2)

## 2023-01-15 LAB — COMPREHENSIVE METABOLIC PANEL
ALT: 7 U/L (ref 0–44)
AST: 12 U/L — ABNORMAL LOW (ref 15–41)
Albumin: 4 g/dL (ref 3.5–5.0)
Alkaline Phosphatase: 45 U/L (ref 38–126)
Anion gap: 8 (ref 5–15)
BUN: 19 mg/dL (ref 8–23)
CO2: 25 mmol/L (ref 22–32)
Calcium: 8.4 mg/dL — ABNORMAL LOW (ref 8.9–10.3)
Chloride: 102 mmol/L (ref 98–111)
Creatinine, Ser: 1.21 mg/dL (ref 0.61–1.24)
GFR, Estimated: >60 mL/min (ref >60)
Glucose, Bld: 149 mg/dL — ABNORMAL HIGH (ref 70–99)
Potassium: 4.7 mmol/L (ref 3.5–5.1)
Sodium: 135 mmol/L (ref 135–145)
Total Bilirubin: 1.1 mg/dL (ref 0.3–1.2)
Total Protein: 8 g/dL (ref 6.5–8.1)

## 2023-01-15 LAB — DIRECT ANTIGLOBULIN TEST (NOT AT ARMC)
DAT, IgG: NEGATIVE
DAT, complement: NEGATIVE

## 2023-01-15 LAB — IRON AND TIBC
Iron: 96 ug/dL (ref 45–182)
Saturation Ratios: 21 % (ref 17.9–39.5)
TIBC: 463 ug/dL — ABNORMAL HIGH (ref 250–450)
UIBC: 367 ug/dL

## 2023-01-15 LAB — RETICULOCYTES
Immature Retic Fract: 31.8 % — ABNORMAL HIGH (ref 2.3–15.9)
RBC.: 2.4 MIL/uL — ABNORMAL LOW (ref 4.22–5.81)
Retic Count, Absolute: 191.5 10*3/uL — ABNORMAL HIGH (ref 19.0–186.0)
Retic Ct Pct: 8 % — ABNORMAL HIGH (ref 0.4–3.1)

## 2023-01-15 LAB — FERRITIN: Ferritin: 289 ng/mL (ref 24–336)

## 2023-01-15 LAB — LACTATE DEHYDROGENASE: LDH: 163 U/L (ref 98–192)

## 2023-01-15 NOTE — Telephone Encounter (Signed)
Left message for patient to call back to review procedure instructions

## 2023-01-15 NOTE — Telephone Encounter (Signed)
Reviewed procedure instructions with patient's wife, Terrence Dupont.

## 2023-01-15 NOTE — Telephone Encounter (Addendum)
Cardiac Catheterization scheduled at [facility] for: Monday January 19, 2023 9 AM Arrival time [facility] Entrance A at: 7 AM  Nothing to eat after midnight prior to procedure, clear liquids until 5 AM day of procedure.  Medication instructions: -Hold:  Eliquis-none 01/17/23 until post procedure  Lasix-AM of procedure  Insulin/Treshiba-AM of procedure -Other usual morning medications can be taken with sips of water including aspirin 81 mg.  Confirmed patient has responsible adult to drive home post procedure and be with patient first 24 hours after arriving home.  Plan to go home the same day, you will only stay overnight if medically necessary.  Unsuccessful attempt to reach patient by telephone to review instructions, no answer, no voicemail.

## 2023-01-16 ENCOUNTER — Telehealth: Payer: Self-pay | Admitting: Pharmacist

## 2023-01-16 ENCOUNTER — Other Ambulatory Visit: Payer: Self-pay

## 2023-01-16 ENCOUNTER — Telehealth: Payer: Self-pay | Admitting: Nurse Practitioner

## 2023-01-16 DIAGNOSIS — D500 Iron deficiency anemia secondary to blood loss (chronic): Secondary | ICD-10-CM

## 2023-01-16 DIAGNOSIS — E119 Type 2 diabetes mellitus without complications: Secondary | ICD-10-CM

## 2023-01-16 NOTE — Telephone Encounter (Signed)
    01/16/2023 Name: David Duarte MRN: [id number] DOB: 09/14/1941   Request to optimize patient's diabetes regimen.  [id number] placed to have patient scheduled for pharmacy clinic.  Regina Eck, PharmD, [place] Clinical Pharmacist, [facility]

## 2023-01-16 NOTE — Telephone Encounter (Signed)
Patient notified and verbalized understanding. Patient had no questions or concerns at this time.  

## 2023-01-16 NOTE — Telephone Encounter (Signed)
   Labs stable for pending cath.  Ongoing anemia, but overall stable.  Lab Results  Component Value Date   CREATININE 1.21 01/15/2023   BUN 19 01/15/2023   NA 135 01/15/2023   K 4.7 01/15/2023   CL 102 01/15/2023   CO2 25 01/15/2023    Lab Results  Component Value Date   WBC 7.2 01/15/2023   HGB 8.0 (L) 01/15/2023   HCT 26.5 (L) 01/15/2023   MCV 108.2 (H) 01/15/2023   PLT 152 01/15/2023     Murray Hodgkins, NP 01/16/2023, 10:22 AM

## 2023-01-17 LAB — MISC LABCORP TEST (SEND OUT): Labcorp test code: 121265

## 2023-01-19 ENCOUNTER — Other Ambulatory Visit: Payer: Self-pay

## 2023-01-19 ENCOUNTER — Encounter (HOSPITAL_COMMUNITY)
Admission: RE | Disposition: A | Discharge status: Home / Self Care | Payer: Self-pay | Source: Home / Self Care | Attending: Internal Medicine

## 2023-01-19 ENCOUNTER — Telehealth: Payer: Self-pay

## 2023-01-19 ENCOUNTER — Encounter (HOSPITAL_COMMUNITY): Payer: Self-pay | Admitting: Internal Medicine

## 2023-01-19 ENCOUNTER — Ambulatory Visit (HOSPITAL_COMMUNITY)
Admission: RE | Admit: 2023-01-19 | Discharge: 2023-01-19 | Disposition: A | Discharge status: Home / Self Care | Payer: Medicare Other | Attending: Internal Medicine | Admitting: Internal Medicine

## 2023-01-19 ENCOUNTER — Telehealth: Payer: Self-pay | Admitting: Family Medicine

## 2023-01-19 DIAGNOSIS — I2584 Coronary atherosclerosis due to calcified coronary lesion: Secondary | ICD-10-CM | POA: Diagnosis not present

## 2023-01-19 DIAGNOSIS — I480 Paroxysmal atrial fibrillation: Secondary | ICD-10-CM | POA: Diagnosis not present

## 2023-01-19 DIAGNOSIS — I723 Aneurysm of iliac artery: Secondary | ICD-10-CM | POA: Diagnosis not present

## 2023-01-19 DIAGNOSIS — I10 Essential (primary) hypertension: Secondary | ICD-10-CM | POA: Insufficient documentation

## 2023-01-19 DIAGNOSIS — I714 Abdominal aortic aneurysm, without rupture, unspecified: Secondary | ICD-10-CM | POA: Diagnosis not present

## 2023-01-19 DIAGNOSIS — R9439 Abnormal result of other cardiovascular function study: Secondary | ICD-10-CM | POA: Diagnosis not present

## 2023-01-19 DIAGNOSIS — I2582 Chronic total occlusion of coronary artery: Secondary | ICD-10-CM | POA: Insufficient documentation

## 2023-01-19 DIAGNOSIS — Z79899 Other long term (current) drug therapy: Secondary | ICD-10-CM | POA: Insufficient documentation

## 2023-01-19 DIAGNOSIS — E119 Type 2 diabetes mellitus without complications: Secondary | ICD-10-CM | POA: Diagnosis not present

## 2023-01-19 DIAGNOSIS — D649 Anemia, unspecified: Secondary | ICD-10-CM | POA: Diagnosis not present

## 2023-01-19 DIAGNOSIS — Z794 Long term (current) use of insulin: Secondary | ICD-10-CM | POA: Diagnosis not present

## 2023-01-19 DIAGNOSIS — E785 Hyperlipidemia, unspecified: Secondary | ICD-10-CM | POA: Diagnosis not present

## 2023-01-19 DIAGNOSIS — K7460 Unspecified cirrhosis of liver: Secondary | ICD-10-CM | POA: Diagnosis not present

## 2023-01-19 DIAGNOSIS — R0609 Other forms of dyspnea: Secondary | ICD-10-CM | POA: Diagnosis not present

## 2023-01-19 DIAGNOSIS — Z7901 Long term (current) use of anticoagulants: Secondary | ICD-10-CM | POA: Diagnosis not present

## 2023-01-19 DIAGNOSIS — Z87891 Personal history of nicotine dependence: Secondary | ICD-10-CM | POA: Insufficient documentation

## 2023-01-19 DIAGNOSIS — I2510 Atherosclerotic heart disease of native coronary artery without angina pectoris: Secondary | ICD-10-CM | POA: Diagnosis not present

## 2023-01-19 HISTORY — PX: RIGHT/LEFT HEART CATH AND CORONARY ANGIOGRAPHY: CATH118266

## 2023-01-19 LAB — BASIC METABOLIC PANEL
Anion gap: 12 (ref 5–15)
BUN: 16 mg/dL (ref 8–23)
CO2: 22 mmol/L (ref 22–32)
Calcium: 8.6 mg/dL — ABNORMAL LOW (ref 8.9–10.3)
Chloride: 102 mmol/L (ref 98–111)
Creatinine, Ser: 1.21 mg/dL (ref 0.61–1.24)
GFR, Estimated: >60 mL/min (ref >60)
Glucose, Bld: 174 mg/dL — ABNORMAL HIGH (ref 70–99)
Potassium: 3.8 mmol/L (ref 3.5–5.1)
Sodium: 136 mmol/L (ref 135–145)

## 2023-01-19 LAB — CBC
HCT: 26.3 % — ABNORMAL LOW (ref 39.0–52.0)
Hemoglobin: 8.2 g/dL — ABNORMAL LOW (ref 13.0–17.0)
MCH: 32.9 pg (ref 26.0–34.0)
MCHC: 31.2 g/dL (ref 30.0–36.0)
MCV: 105.6 fL — ABNORMAL HIGH (ref 80.0–100.0)
Platelets: 130 10*3/uL — ABNORMAL LOW (ref 150–400)
RBC: 2.49 MIL/uL — ABNORMAL LOW (ref 4.22–5.81)
RDW: 17.6 % — ABNORMAL HIGH (ref 11.5–15.5)
WBC: 9.4 10*3/uL (ref 4.0–10.5)
nRBC: 0 % (ref 0.0–0.2)

## 2023-01-19 LAB — POCT I-STAT 7, (LYTES, BLD GAS, ICA,H+H)
Acid-base deficit: 3 mmol/L — ABNORMAL HIGH (ref 0.0–2.0)
Bicarbonate: 22 mmol/L (ref 20.0–28.0)
Calcium, Ion: 1.18 mmol/L (ref 1.15–1.40)
HCT: 22 % — ABNORMAL LOW (ref 39.0–52.0)
Hemoglobin: 7.5 g/dL — ABNORMAL LOW (ref 13.0–17.0)
O2 Saturation: 95 %
Potassium: 3.9 mmol/L (ref 3.5–5.1)
Sodium: 140 mmol/L (ref 135–145)
TCO2: 23 mmol/L (ref 22–32)
pCO2 arterial: 36.5 mmHg (ref 32–48)
pH, Arterial: 7.388 (ref 7.35–7.45)
pO2, Arterial: 77 mmHg — ABNORMAL LOW (ref 83–108)

## 2023-01-19 LAB — POCT I-STAT EG7
Acid-base deficit: 2 mmol/L (ref 0.0–2.0)
Bicarbonate: 23.3 mmol/L (ref 20.0–28.0)
Calcium, Ion: 1.18 mmol/L (ref 1.15–1.40)
HCT: 22 % — ABNORMAL LOW (ref 39.0–52.0)
Hemoglobin: 7.5 g/dL — ABNORMAL LOW (ref 13.0–17.0)
O2 Saturation: 60 %
Potassium: 4 mmol/L (ref 3.5–5.1)
Sodium: 140 mmol/L (ref 135–145)
TCO2: 25 mmol/L (ref 22–32)
pCO2, Ven: 41 mmHg — ABNORMAL LOW (ref 44–60)
pH, Ven: 7.362 (ref 7.25–7.43)
pO2, Ven: 32 mmHg (ref 32–45)

## 2023-01-19 LAB — GLUCOSE, CAPILLARY: Glucose-Capillary: 170 mg/dL — ABNORMAL HIGH (ref 70–99)

## 2023-01-19 SURGERY — RIGHT/LEFT HEART CATH AND CORONARY ANGIOGRAPHY
Anesthesia: LOCAL

## 2023-01-19 MED ORDER — SODIUM CHLORIDE 0.9 % WEIGHT BASED INFUSION: 1.0000 mL/kg/h | INTRAVENOUS | Status: DC

## 2023-01-19 MED ORDER — ASPIRIN 81 MG PO CHEW
81.0000 mg | CHEWABLE_TABLET | ORAL | Status: AC
  Administered 2023-01-19: 81 mg via ORAL
  Filled 2023-01-19: qty 1

## 2023-01-19 MED ORDER — FENTANYL CITRATE (PF) 100 MCG/2ML IJ SOLN
INTRAMUSCULAR | Status: DC | PRN
  Administered 2023-01-19: 12.5 ug via INTRAVENOUS

## 2023-01-19 MED ORDER — HEPARIN SODIUM (PORCINE) 1000 UNIT/ML IJ SOLN
INTRAMUSCULAR | Status: DC | PRN
  Administered 2023-01-19: 5000 [IU] via INTRAVENOUS

## 2023-01-19 MED ORDER — MIDAZOLAM HCL 2 MG/2ML IJ SOLN
INTRAMUSCULAR | Status: DC | PRN
  Administered 2023-01-19: .5 mg via INTRAVENOUS

## 2023-01-19 MED ORDER — LIDOCAINE HCL (PF) 1 % IJ SOLN
INTRAMUSCULAR | Status: DC | PRN
  Administered 2023-01-19: 5 mL

## 2023-01-19 MED ORDER — ASPIRIN 81 MG PO CHEW: 81.0000 mg | CHEWABLE_TABLET | ORAL | Status: DC

## 2023-01-19 MED ORDER — VERAPAMIL HCL 2.5 MG/ML IV SOLN
INTRAVENOUS | Status: DC | PRN
  Administered 2023-01-19: 10 mL via INTRA_ARTERIAL

## 2023-01-19 MED ORDER — FENTANYL CITRATE (PF) 100 MCG/2ML IJ SOLN
INTRAMUSCULAR | Status: AC
  Filled 2023-01-19: qty 2

## 2023-01-19 MED ORDER — SODIUM CHLORIDE 0.9% FLUSH: 3.0000 mL | INTRAVENOUS | Status: DC | PRN

## 2023-01-19 MED ORDER — SODIUM CHLORIDE 0.9 % IV SOLN: 250.0000 mL | INTRAVENOUS | Status: DC | PRN

## 2023-01-19 MED ORDER — HYDRALAZINE HCL 20 MG/ML IJ SOLN: 10.0000 mg | INTRAMUSCULAR | Status: DC | PRN

## 2023-01-19 MED ORDER — LIDOCAINE HCL (PF) 1 % IJ SOLN
INTRAMUSCULAR | Status: AC
  Filled 2023-01-19: qty 30

## 2023-01-19 MED ORDER — LABETALOL HCL 5 MG/ML IV SOLN: 10.0000 mg | INTRAVENOUS | Status: DC | PRN

## 2023-01-19 MED ORDER — SODIUM CHLORIDE 0.9% FLUSH: 3.0000 mL | Freq: Two times a day (BID) | INTRAVENOUS | Status: DC

## 2023-01-19 MED ORDER — HEPARIN (PORCINE) IN NACL 1000-0.9 UT/500ML-% IV SOLN
INTRAVENOUS | Status: DC | PRN
  Administered 2023-01-19 (×2): 500 mL

## 2023-01-19 MED ORDER — SODIUM CHLORIDE 0.9 % WEIGHT BASED INFUSION
3.0000 mL/kg/h | INTRAVENOUS | Status: AC
  Administered 2023-01-19: 3 mL/kg/h via INTRAVENOUS

## 2023-01-19 MED ORDER — IOHEXOL 350 MG/ML SOLN
INTRAVENOUS | Status: DC | PRN
  Administered 2023-01-19: 88 mL

## 2023-01-19 MED ORDER — ACETAMINOPHEN 325 MG PO TABS: 650.0000 mg | ORAL_TABLET | ORAL | Status: DC | PRN

## 2023-01-19 MED ORDER — ONDANSETRON HCL 4 MG/2ML IJ SOLN: 4.0000 mg | Freq: Four times a day (QID) | INTRAMUSCULAR | Status: DC | PRN

## 2023-01-19 MED ORDER — MIDAZOLAM HCL 2 MG/2ML IJ SOLN
INTRAMUSCULAR | Status: AC
  Filled 2023-01-19: qty 2

## 2023-01-19 MED ORDER — VERAPAMIL HCL 2.5 MG/ML IV SOLN
INTRAVENOUS | Status: AC
  Filled 2023-01-19: qty 2

## 2023-01-19 MED ORDER — HEPARIN SODIUM (PORCINE) 1000 UNIT/ML IJ SOLN
INTRAMUSCULAR | Status: AC
  Filled 2023-01-19: qty 10

## 2023-01-19 MED ORDER — SODIUM CHLORIDE 0.9 % IV SOLN: INTRAVENOUS | Status: DC

## 2023-01-19 SURGICAL SUPPLY — 13 items
CATH 5FR JL3.5 JR4 ANG PIG MP (CATHETERS) IMPLANT
CATH BALLN WEDGE 5F 110CM (CATHETERS) IMPLANT
DEVICE RAD COMP TR BAND LRG (VASCULAR PRODUCTS) IMPLANT
GLIDESHEATH SLEND SS 6F .021 (SHEATH) IMPLANT
GUIDEWIRE .025 260CM (WIRE) IMPLANT
GUIDEWIRE INQWIRE 1.5J.035X260 (WIRE) IMPLANT
INQWIRE 1.5J .035X260CM (WIRE) ×1
KIT HEART LEFT (KITS) ×2 IMPLANT
PACK CARDIAC CATHETERIZATION (CUSTOM PROCEDURE TRAY) ×2 IMPLANT
SHEATH GLIDE SLENDER 4/5FR (SHEATH) IMPLANT
SHEATH PROBE COVER 6X72 (BAG) IMPLANT
TRANSDUCER W/STOPCOCK (MISCELLANEOUS) ×2 IMPLANT
TUBING CIL FLEX 10 FLL-RA (TUBING) ×2 IMPLANT

## 2023-01-19 NOTE — Interval H&P Note (Signed)
History and Physical Interval Note:  01/19/2023 9:08 AM  David Duarte  has presented today for surgery, with the diagnosis of abnormal stress test and preop evaluation.  The various methods of treatment have been discussed with the patient and family. After consideration of risks, benefits and other options for treatment, the patient has consented to  Procedure(s): RIGHT/LEFT HEART CATH AND CORONARY ANGIOGRAPHY (N/A) as a surgical intervention.  The patient's history has been reviewed, patient examined, no change in status, stable for surgery.  I have reviewed the patient's chart and labs.  Questions were answered to the patient's satisfaction.    Cath Lab Visit (complete for each Cath Lab visit)  Clinical Evaluation Leading to the Procedure:   ACS: No.  Non-ACS:    Anginal Classification: No Symptoms  Anti-ischemic medical therapy: No Therapy  Non-Invasive Test Results: Intermediate-risk stress test findings: cardiac mortality 1-3%/year  Prior CABG: No previous CABG  David Duarte

## 2023-01-19 NOTE — Discharge Instructions (Signed)

## 2023-01-19 NOTE — Telephone Encounter (Signed)
Called patient to schedule Medicare Annual Wellness Visit (AWV). No voicemail available to leave a message.  Last date of AWV: due 11/10/2009 awvi per [facility]  Please schedule an appointment at any time with either Laura or Courtney, NHA's. .  If any questions, please contact me at [phone].  Thank you,  Stephanie,  AMB Clinical Support [facility] AWV Program Direct Dial ??[phone]   

## 2023-01-19 NOTE — Progress Notes (Unsigned)
  Care Management   Outreach Note  01/19/2023 Name: David Duarte MRN: [id number] DOB: 12/16/1940  An unsuccessful telephone outreach was attempted today to contact the patient about Chronic Care Management needs.    Follow Up Plan:  A HIPAA compliant phone message was left for the patient providing contact information and requesting a return call.  The care management team will reach out to the patient again over the next 7 days.  If patient returns call to provider office, please advise to call [facility] * at [phone] Larsson, [place], [place] [address] Direct Dial: [phone] [email or web address]

## 2023-01-19 NOTE — Progress Notes (Signed)
TR BAND REMOVAL  LOCATION:    right radial  DEFLATED PER PROTOCOL:    Yes.    TIME BAND OFF / DRESSING APPLIED:    1210 gauze dressing applied   SITE UPON ARRIVAL:    Level 0  SITE AFTER BAND REMOVAL:    Level 0  CIRCULATION SENSATION AND MOVEMENT:    Within Normal Limits   Yes.    COMMENTS:   no issues noted

## 2023-01-19 NOTE — Brief Op Note (Signed)
BRIEF CARDIAC CATHETERIZATION NOTE  01/19/2023  10:28 AM  PATIENT:  David Duarte  82 y.o. 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 Normal LVEF (>65%). Mildly elevated left and right heart filling pressures. Normal Fick cardiac output/index.  RECOMMENDATIONS: Continue medical therapy and secondary prevention of coronary artery disease.  Nelva Bush, MD [facility]

## 2023-01-20 LAB — TYPE AND SCREEN
ABO/RH(D): O POS
Antibody Screen: POSITIVE
DAT, IgG: NEGATIVE
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
## 2023-01-22 ENCOUNTER — Inpatient Hospital Stay: Payer: Medicare Other

## 2023-01-22 ENCOUNTER — Other Ambulatory Visit: Payer: Self-pay | Admitting: Gastroenterology

## 2023-01-22 ENCOUNTER — Inpatient Hospital Stay (HOSPITAL_BASED_OUTPATIENT_CLINIC_OR_DEPARTMENT_OTHER): Payer: Medicare Other | Admitting: Physician Assistant

## 2023-01-22 ENCOUNTER — Ambulatory Visit: Payer: No Typology Code available for payment source | Admitting: Physician Assistant

## 2023-01-22 ENCOUNTER — Encounter: Payer: Self-pay | Admitting: Physician Assistant

## 2023-01-22 VITALS — BP 136/68 | HR 71 | Temp 98.0°F | Resp 19 | Ht 67.5 in | Wt 228.8 lb

## 2023-01-22 DIAGNOSIS — N1832 Chronic kidney disease, stage 3b: Secondary | ICD-10-CM

## 2023-01-22 DIAGNOSIS — Z86711 Personal history of pulmonary embolism: Secondary | ICD-10-CM

## 2023-01-22 DIAGNOSIS — E1122 Type 2 diabetes mellitus with diabetic chronic kidney disease: Secondary | ICD-10-CM | POA: Diagnosis not present

## 2023-01-22 DIAGNOSIS — Z87891 Personal history of nicotine dependence: Secondary | ICD-10-CM

## 2023-01-22 DIAGNOSIS — K7460 Unspecified cirrhosis of liver: Secondary | ICD-10-CM

## 2023-01-22 DIAGNOSIS — D696 Thrombocytopenia, unspecified: Secondary | ICD-10-CM

## 2023-01-22 DIAGNOSIS — D631 Anemia in chronic kidney disease: Secondary | ICD-10-CM

## 2023-01-22 DIAGNOSIS — I480 Paroxysmal atrial fibrillation: Secondary | ICD-10-CM | POA: Diagnosis not present

## 2023-01-22 DIAGNOSIS — D649 Anemia, unspecified: Secondary | ICD-10-CM

## 2023-01-22 DIAGNOSIS — Z7901 Long term (current) use of anticoagulants: Secondary | ICD-10-CM

## 2023-01-22 DIAGNOSIS — D509 Iron deficiency anemia, unspecified: Secondary | ICD-10-CM | POA: Diagnosis not present

## 2023-01-22 DIAGNOSIS — D500 Iron deficiency anemia secondary to blood loss (chronic): Secondary | ICD-10-CM

## 2023-01-22 DIAGNOSIS — D539 Nutritional anemia, unspecified: Secondary | ICD-10-CM

## 2023-01-22 DIAGNOSIS — Z79899 Other long term (current) drug therapy: Secondary | ICD-10-CM

## 2023-01-22 HISTORY — DX: Anemia in chronic kidney disease: D63.1

## 2023-01-22 LAB — SAMPLE TO BLOOD BANK

## 2023-01-22 LAB — CBC WITH DIFFERENTIAL/PLATELET
Abs Immature Granulocytes: 0.32 10*3/uL — ABNORMAL HIGH (ref 0.00–0.07)
Basophils Absolute: 0 10*3/uL (ref 0.0–0.1)
Basophils Relative: 0 %
Eosinophils Absolute: 0.2 10*3/uL (ref 0.0–0.5)
Eosinophils Relative: 3 %
HCT: 24.3 % — ABNORMAL LOW (ref 39.0–52.0)
Hemoglobin: 7.6 g/dL — ABNORMAL LOW (ref 13.0–17.0)
Immature Granulocytes: 5 %
Lymphocytes Relative: 15 %
Lymphs Abs: 1 10*3/uL (ref 0.7–4.0)
MCH: 33 pg (ref 26.0–34.0)
MCHC: 31.3 g/dL (ref 30.0–36.0)
MCV: 105.7 fL — ABNORMAL HIGH (ref 80.0–100.0)
Monocytes Absolute: 2.3 10*3/uL — ABNORMAL HIGH (ref 0.1–1.0)
Monocytes Relative: 33 %
Neutro Abs: 3.1 10*3/uL (ref 1.7–7.7)
Neutrophils Relative %: 44 %
Platelets: 132 10*3/uL — ABNORMAL LOW (ref 150–400)
RBC: 2.3 MIL/uL — ABNORMAL LOW (ref 4.22–5.81)
RDW: 17.3 % — ABNORMAL HIGH (ref 11.5–15.5)
WBC: 7 10*3/uL (ref 4.0–10.5)
nRBC: 0 % (ref 0.0–0.2)

## 2023-01-22 LAB — PREPARE RBC (CROSSMATCH)

## 2023-01-22 MED ORDER — EPOETIN ALFA-EPBX 10000 UNIT/ML IJ SOLN
10000.0000 [IU] | Freq: Once | INTRAMUSCULAR | Status: AC
  Administered 2023-01-22: 10000 [IU] via SUBCUTANEOUS
  Filled 2023-01-22: qty 1

## 2023-01-22 NOTE — Patient Instructions (Addendum)
[facility] **   You were seen today by Tarri Abernethy PA-C for your anemia.    Your anemia is likely due to multiple causes. I suspect that you are losing blood internally.  This may be bleeding from your stomach or intestines (being managed by your stomach doctor, Dr. Gala Romney).  You may also have some bleeding from your aortic aneurysm repair (I am awaiting a call from your vascular specialist to discuss this further). Your body also may have a harder time making new blood cells due to your chronic kidney disease.  Because of this, we will start you on injections (Retacrit) once a week to help your body to make more blood cells.  (Please see the attached handout for important information regarding possible side effects of this medication.)  Chronic kidney disease Your labs show chronic kidney disease that was likely being masked by your liver cirrhosis. We will refer you to see a kidney specialist (Dr. Theador Hawthorne)  BLOOD TRANSFUSION TOMORROW: Since your blood is still low, we will schedule you for a blood transfusion tomorrow (Friday, 01/23/2023) at 11 AM. Take 650 mg Tylenol and 25 mg Benadryl at home 30 minutes before your appointment time. Please arrive to the [facility] by 10:45 AM. Make sure that you wear your blue bracelet until after we have finished your blood transfusion.  FOLLOW-UP APPOINTMENTS: - We will check your blood once a week. - We will give you a Retacrit shot once a week (same day as your blood check).  We will give you blood transfusions as needed. - We will see you for follow-up visit in 1 month.  ** Thank you for trusting me with your healthcare!  I strive to provide all of my patients with quality care at each visit.  If you receive a survey for this visit, I would be so grateful to you for taking the time to provide feedback.  Thank you in advance!  ~ Malloree Raboin                   Dr.  Derek Jack   &   Tarri Abernethy, PA-C   - - - - - - - - - - - - - - - - - -    Thank you for choosing [facility] at [facility] to provide your oncology and hematology care.  To afford each patient quality time with our provider, please arrive at least 15 minutes before your scheduled appointment time.   If you have a lab appointment with the [facility] please come in thru the Main Entrance and check in at the main information desk.  You need to re-schedule your appointment should you arrive 10 or more minutes late.  We strive to give you quality time with our providers, and arriving late affects you and other patients whose appointments are after yours.  Also, if you no show three or more times for appointments you may be dismissed from the clinic at the providers discretion.     Again, thank you for choosing [facility].  Our hope is that these requests will decrease the amount of time that you wait before being seen by our physicians.       _____________________________________________________________  Should you have questions after your visit to [facility], please contact our office at [phone] and follow the prompts.  Our office hours are 8:00  a.m. and 4:30 p.m. Monday - Friday.  Please note that voicemails left after 4:00 p.m. may not be returned until the following business day.  We are closed weekends and major holidays.  You do have access to a nurse 24-7, just call the main number to the clinic [phone] and do not press any options, hold on the line and a nurse will answer the phone.    For prescription refill requests, have your pharmacy contact our office and allow 72 hours.

## 2023-01-22 NOTE — Progress Notes (Signed)
Will give one unit of blood tomorrow per Reb. Pennington PA-C.

## 2023-01-22 NOTE — Patient Instructions (Signed)
[facility]  Discharge Instructions: Thank you for choosing [facility] to provide your oncology and hematology care.  If you have a lab appointment with the [facility], please come in thru the Main Entrance and check in at the main information desk.  Wear comfortable clothing and clothing appropriate for easy access to any Portacath or PICC line.   We strive to give you quality time with your provider. You may need to reschedule your appointment if you arrive late (15 or more minutes).  Arriving late affects you and other patients whose appointments are after yours.  Also, if you miss three or more appointments without notifying the office, you may be dismissed from the clinic at the provider's discretion.      For prescription refill requests, have your pharmacy contact our office and allow 72 hours for refills to be completed.    Today you received the following Retacrit, return tomorrow for blood transfusion.   To help prevent nausea and vomiting after your treatment, we encourage you to take your nausea medication as directed.  BELOW ARE SYMPTOMS THAT SHOULD BE REPORTED IMMEDIATELY: *FEVER GREATER THAN 100.4 F (38 C) OR HIGHER *CHILLS OR SWEATING *NAUSEA AND VOMITING THAT IS NOT CONTROLLED WITH YOUR NAUSEA MEDICATION *UNUSUAL SHORTNESS OF BREATH *UNUSUAL BRUISING OR BLEEDING *URINARY PROBLEMS (pain or burning when urinating, or frequent urination) *BOWEL PROBLEMS (unusual diarrhea, constipation, pain near the anus) TENDERNESS IN MOUTH AND THROAT WITH OR WITHOUT PRESENCE OF ULCERS (sore throat, sores in mouth, or a toothache) UNUSUAL RASH, SWELLING OR PAIN  UNUSUAL VAGINAL DISCHARGE OR ITCHING   Items with * indicate a potential emergency and should be followed up as soon as possible or go to the Emergency Department if any problems should occur.  Please show the CHEMOTHERAPY ALERT CARD or IMMUNOTHERAPY ALERT CARD at check-in to the Emergency  Department and triage nurse.  Should you have questions after your visit or need to cancel or reschedule your appointment, please contact [facility] [phone]  and follow the prompts.  Office hours are 8:00 a.m. to 4:30 p.m. Monday - Friday. Please note that voicemails left after 4:00 p.m. may not be returned until the following business day.  We are closed weekends and major holidays. You have access to a nurse at all times for urgent questions. Please call the main number to the clinic [phone] and follow the prompts.  For any non-urgent questions, you may also contact your provider using MyChart. We now offer e-Visits for anyone 67 and older to request care online for non-urgent symptoms. For details visit [email or web address].   Also download the MyChart app! Go to the app store, search "MyChart", open the app, select [facility], and log in with your MyChart username and password.

## 2023-01-22 NOTE — Progress Notes (Signed)
Patient presents today for Retacrit injection. Hemoglobin reviewed prior to administration. VSS tolerated without incident or complaint. See MAR for details. Patient stable during and after injection. Patient discharged in satisfactory condition with no s/s of distress noted.  

## 2023-01-23 ENCOUNTER — Inpatient Hospital Stay: Payer: Medicare Other

## 2023-01-23 ENCOUNTER — Other Ambulatory Visit: Payer: Self-pay | Admitting: Physician Assistant

## 2023-01-23 DIAGNOSIS — D509 Iron deficiency anemia, unspecified: Secondary | ICD-10-CM | POA: Diagnosis not present

## 2023-01-23 DIAGNOSIS — D649 Anemia, unspecified: Secondary | ICD-10-CM

## 2023-01-23 DIAGNOSIS — N1832 Chronic kidney disease, stage 3b: Secondary | ICD-10-CM | POA: Diagnosis not present

## 2023-01-23 MED ORDER — SODIUM CHLORIDE 0.9% IV SOLUTION
250.0000 mL | Freq: Once | INTRAVENOUS | Status: AC
  Administered 2023-01-23: 250 mL via INTRAVENOUS

## 2023-01-23 NOTE — Progress Notes (Unsigned)
  Care Management   Outreach Note  01/23/2023 Name: David Duarte MRN: [id number] DOB: 10-10-1941  Second unsuccessful telephone outreach was attempted today to contact the patient about Chronic Care Management needs.    Follow Up Plan:  A HIPAA compliant phone message was left for the patient providing contact information and requesting a return call.  The care management team will reach out to the patient again over the next 7 days.  If patient returns call to provider office, please advise to call [facility] * at [phone] Larsson, [place], [place] [address] Direct Dial: [phone] [email or web address]

## 2023-01-23 NOTE — Patient Instructions (Signed)
[facility]  Discharge Instructions: Thank you for choosing [facility] to provide your oncology and hematology care.  If you have a lab appointment with the Cancer Center, please come in thru the Main Entrance and check in at the main information desk.  Wear comfortable clothing and clothing appropriate for easy access to any Portacath or PICC line.   We strive to give you quality time with your provider. You may need to reschedule your appointment if you arrive late (15 or more minutes).  Arriving late affects you and other patients whose appointments are after yours.  Also, if you miss three or more appointments without notifying the office, you may be dismissed from the clinic at the provider's discretion.      For prescription refill requests, have your pharmacy contact our office and allow 72 hours for refills to be completed.    Today you received the following chemotherapy and/or immunotherapy agents 1UPRBC      To help prevent nausea and vomiting after your treatment, we encourage you to take your nausea medication as directed.  BELOW ARE SYMPTOMS THAT SHOULD BE REPORTED IMMEDIATELY: *FEVER GREATER THAN 100.4 F (38 C) OR HIGHER *CHILLS OR SWEATING *NAUSEA AND VOMITING THAT IS NOT CONTROLLED WITH YOUR NAUSEA MEDICATION *UNUSUAL SHORTNESS OF BREATH *UNUSUAL BRUISING OR BLEEDING *URINARY PROBLEMS (pain or burning when urinating, or frequent urination) *BOWEL PROBLEMS (unusual diarrhea, constipation, pain near the anus) TENDERNESS IN MOUTH AND THROAT WITH OR WITHOUT PRESENCE OF ULCERS (sore throat, sores in mouth, or a toothache) UNUSUAL RASH, SWELLING OR PAIN  UNUSUAL VAGINAL DISCHARGE OR ITCHING   Items with * indicate a potential emergency and should be followed up as soon as possible or go to the Emergency Department if any problems should occur.  Please show the CHEMOTHERAPY ALERT CARD or IMMUNOTHERAPY ALERT CARD at check-in to the Emergency  Department and triage nurse.  Should you have questions after your visit or need to cancel or reschedule your appointment, please contact [facility] [phone]  and follow the prompts.  Office hours are 8:00 a.m. to 4:30 p.m. Monday - Friday. Please note that voicemails left after 4:00 p.m. may not be returned until the following business day.  We are closed weekends and major holidays. You have access to a nurse at all times for urgent questions. Please call the main number to the clinic [phone] and follow the prompts.  For any non-urgent questions, you may also contact your provider using MyChart. We now offer e-Visits for anyone 18 and older to request care online for non-urgent symptoms. For details visit [email or web address].   Also download the MyChart app! Go to the app store, search "MyChart", open the app, select [facility], and log in with your MyChart username and password.   

## 2023-01-23 NOTE — Progress Notes (Signed)
Patient presents today for 1UPRBC per providers order.  Vital signs WNL.  Patient has no new complaints at this time.  Patient took tylenol and Benadryl at home prior to appointment.  Peripheral IV started and blood return noted pre and post infusion.  Stable during infusion without adverse affects.  Vital signs stable.  No complaints at this time.  Discharge from clinic ambulatory in stable condition.  Alert and oriented X 3.  Follow up with [facility] as scheduled.

## 2023-01-26 ENCOUNTER — Other Ambulatory Visit: Payer: Self-pay

## 2023-01-26 DIAGNOSIS — N1832 Chronic kidney disease, stage 3b: Secondary | ICD-10-CM

## 2023-01-26 DIAGNOSIS — D649 Anemia, unspecified: Secondary | ICD-10-CM

## 2023-01-27 NOTE — Progress Notes (Signed)
  Care Coordination  Note  01/27/2023 Name: [name] MRN: [id number] DOB: [birth year]  David Duarte is a 82 y.o. year old male who is a primary care patient of Rakes, Connye Burkitt, FNP. I reached out to David Duarte by phone today to offer care coordination services.      Mr. Cordy was given information about Care Coordination services today including:  The Care Coordination services include support from the care team which includes your Nurse Coordinator, Clinical Social Worker, or Pharmacist.  The Care Coordination team is here to help remove barriers to the health concerns and goals most important to you. Care Coordination services are voluntary and the patient may decline or stop services at any time by request to their care team member.   Patient agreed to services and verbal consent obtained.   Follow up plan: Telephone appointment with care coordination team member scheduled for:02/13/2023  Noreene Larsson, [place], [place] [address] Direct Dial: [phone] [email or web address]

## 2023-01-28 ENCOUNTER — Telehealth: Payer: Self-pay | Admitting: Family Medicine

## 2023-01-28 ENCOUNTER — Other Ambulatory Visit: Payer: Self-pay

## 2023-01-28 ENCOUNTER — Ambulatory Visit (INDEPENDENT_AMBULATORY_CARE_PROVIDER_SITE_OTHER): Payer: No Typology Code available for payment source | Admitting: Family Medicine

## 2023-01-28 ENCOUNTER — Encounter: Payer: Self-pay | Admitting: Family Medicine

## 2023-01-28 VITALS — BP 133/68 | HR 72 | Temp 98.1°F | Ht 67.5 in | Wt 228.0 lb

## 2023-01-28 DIAGNOSIS — N1832 Chronic kidney disease, stage 3b: Secondary | ICD-10-CM | POA: Diagnosis not present

## 2023-01-28 DIAGNOSIS — E119 Type 2 diabetes mellitus without complications: Secondary | ICD-10-CM | POA: Diagnosis not present

## 2023-01-28 DIAGNOSIS — D649 Anemia, unspecified: Secondary | ICD-10-CM

## 2023-01-28 DIAGNOSIS — I7143 Infrarenal abdominal aortic aneurysm, without rupture: Secondary | ICD-10-CM

## 2023-01-28 DIAGNOSIS — D631 Anemia in chronic kidney disease: Secondary | ICD-10-CM

## 2023-01-28 DIAGNOSIS — M1711 Unilateral primary osteoarthritis, right knee: Secondary | ICD-10-CM | POA: Diagnosis not present

## 2023-01-28 DIAGNOSIS — I2510 Atherosclerotic heart disease of native coronary artery without angina pectoris: Secondary | ICD-10-CM | POA: Insufficient documentation

## 2023-01-28 DIAGNOSIS — Z794 Long term (current) use of insulin: Secondary | ICD-10-CM

## 2023-01-28 DIAGNOSIS — D539 Nutritional anemia, unspecified: Secondary | ICD-10-CM

## 2023-01-28 DIAGNOSIS — I480 Paroxysmal atrial fibrillation: Secondary | ICD-10-CM

## 2023-01-28 DIAGNOSIS — Z01818 Encounter for other preprocedural examination: Secondary | ICD-10-CM | POA: Diagnosis not present
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
## 2023-01-29 ENCOUNTER — Inpatient Hospital Stay: Payer: Medicare Other

## 2023-01-29 VITALS — BP 134/77 | HR 59 | Temp 97.3°F | Resp 20

## 2023-01-29 DIAGNOSIS — D539 Nutritional anemia, unspecified: Secondary | ICD-10-CM

## 2023-01-29 DIAGNOSIS — N1832 Chronic kidney disease, stage 3b: Secondary | ICD-10-CM

## 2023-01-29 DIAGNOSIS — D509 Iron deficiency anemia, unspecified: Secondary | ICD-10-CM | POA: Diagnosis not present

## 2023-01-29 DIAGNOSIS — D500 Iron deficiency anemia secondary to blood loss (chronic): Secondary | ICD-10-CM

## 2023-01-29 DIAGNOSIS — D649 Anemia, unspecified: Secondary | ICD-10-CM

## 2023-01-29 DIAGNOSIS — Z01818 Encounter for other preprocedural examination: Secondary | ICD-10-CM

## 2023-01-29 LAB — BPAM RBC
Blood Product Expiration Date: 202404222359
Blood Product Expiration Date: 202404222359
ISSUE DATE / TIME: 202403151104
ISSUE DATE / TIME: 202403151104
Unit Type and Rh: 5100
Unit Type and Rh: 5100

## 2023-01-29 LAB — CBC
HCT: 26.1 % — ABNORMAL LOW (ref 39.0–52.0)
Hemoglobin: 8 g/dL — ABNORMAL LOW (ref 13.0–17.0)
MCH: 31.7 pg (ref 26.0–34.0)
MCHC: 30.7 g/dL (ref 30.0–36.0)
MCV: 103.6 fL — ABNORMAL HIGH (ref 80.0–100.0)
Platelets: 146 10*3/uL — ABNORMAL LOW (ref 150–400)
RBC: 2.52 MIL/uL — ABNORMAL LOW (ref 4.22–5.81)
RDW: 17.9 % — ABNORMAL HIGH (ref 11.5–15.5)
WBC: 6.2 10*3/uL (ref 4.0–10.5)
nRBC: 0 % (ref 0.0–0.2)

## 2023-01-29 LAB — TYPE AND SCREEN
ABO/RH(D): O POS
Antibody Screen: POSITIVE
Unit division: 0
Unit division: 0

## 2023-01-29 LAB — TECHNOLOGIST SMEAR REVIEW

## 2023-01-29 LAB — SAMPLE TO BLOOD BANK

## 2023-01-29 MED ORDER — EPOETIN ALFA-EPBX 10000 UNIT/ML IJ SOLN
10000.0000 [IU] | Freq: Once | INTRAMUSCULAR | Status: AC
  Administered 2023-01-29: 10000 [IU] via SUBCUTANEOUS
  Filled 2023-01-29: qty 1

## 2023-01-29 NOTE — Patient Instructions (Signed)
[facility] [place]  Discharge Instructions: Thank you for choosing [facility] to provide your oncology and hematology care.  If you have a lab appointment with the Cancer Center, please come in thru the Main Entrance and check in at the main information desk.  Wear comfortable clothing and clothing appropriate for easy access to any Portacath or PICC line.   We strive to give you quality time with your provider. You may need to reschedule your appointment if you arrive late (15 or more minutes).  Arriving late affects you and other patients whose appointments are after yours.  Also, if you miss three or more appointments without notifying the office, you may be dismissed from the clinic at the provider's discretion.      For prescription refill requests, have your pharmacy contact our office and allow 72 hours for refills to be completed.    Today you received Retacrit 10,000 units     BELOW ARE SYMPTOMS THAT SHOULD BE REPORTED IMMEDIATELY: *FEVER GREATER THAN 100.4 F (38 C) OR HIGHER *CHILLS OR SWEATING *NAUSEA AND VOMITING THAT IS NOT CONTROLLED WITH YOUR NAUSEA MEDICATION *UNUSUAL SHORTNESS OF BREATH *UNUSUAL BRUISING OR BLEEDING *URINARY PROBLEMS (pain or burning when urinating, or frequent urination) *BOWEL PROBLEMS (unusual diarrhea, constipation, pain near the anus) TENDERNESS IN MOUTH AND THROAT WITH OR WITHOUT PRESENCE OF ULCERS (sore throat, sores in mouth, or a toothache) UNUSUAL RASH, SWELLING OR PAIN  UNUSUAL VAGINAL DISCHARGE OR ITCHING   Items with * indicate a potential emergency and should be followed up as soon as possible or go to the Emergency Department if any problems should occur.  Please show the CHEMOTHERAPY ALERT CARD or IMMUNOTHERAPY ALERT CARD at check-in to the Emergency Department and triage nurse.  Should you have questions after your visit or need to cancel or reschedule your appointment, please contact [facility] [place] [phone]  and follow the prompts.  Office hours are 8:00 a.m. to 4:30 p.m. Monday - Friday. Please note that voicemails left after 4:00 p.m. may not be returned until the following business day.  We are closed weekends and major holidays. You have access to a nurse at all times for urgent questions. Please call the main number to the clinic [phone] and follow the prompts.  For any non-urgent questions, you may also contact your provider using MyChart. We now offer e-Visits for anyone 18 and older to request care online for non-urgent symptoms. For details visit [email or web address].   Also download the MyChart app! Go to the app store, search "MyChart", open the app, select [place], and log in with your MyChart username and password.   

## 2023-01-29 NOTE — Progress Notes (Signed)
David Duarte presents today for injection per the provider's orders.  Retacrit 10,000 units administration without incident; injection site WNL; see MAR for injection details.  Patient tolerated procedure well and without incident.  No questions or complaints noted at this time. Pt's hemoglobin noted to be 8 today. No blood transfusion needed tomorrow per parameters.  Discharged from clinic ambulatory in stable condition. Alert and oriented x 3. F/U with [facility] as scheduled.

## 2023-01-30 ENCOUNTER — Inpatient Hospital Stay: Payer: Medicare Other

## 2023-01-30 LAB — ERYTHROPOIETIN: Erythropoietin: 99.8 m[IU]/mL — ABNORMAL HIGH (ref 2.6–18.5)

## 2023-02-03 LAB — IMMUNOFIXATION ELECTROPHORESIS
IgA: 292 mg/dL (ref 61–437)
IgG (Immunoglobin G), Serum: 1514 mg/dL (ref 603–1613)
IgM (Immunoglobulin M), Srm: 156 mg/dL — ABNORMAL HIGH (ref 15–143)
Total Protein ELP: 7.4 g/dL (ref 6.0–8.5)

## 2023-02-05 ENCOUNTER — Inpatient Hospital Stay: Payer: Medicare Other

## 2023-02-05 VITALS — BP 157/71 | HR 63 | Temp 98.1°F | Resp 18

## 2023-02-05 DIAGNOSIS — D649 Anemia, unspecified: Secondary | ICD-10-CM

## 2023-02-05 DIAGNOSIS — D631 Anemia in chronic kidney disease: Secondary | ICD-10-CM

## 2023-02-05 DIAGNOSIS — N1832 Chronic kidney disease, stage 3b: Secondary | ICD-10-CM | POA: Diagnosis not present

## 2023-02-05 DIAGNOSIS — D500 Iron deficiency anemia secondary to blood loss (chronic): Secondary | ICD-10-CM

## 2023-02-05 DIAGNOSIS — D509 Iron deficiency anemia, unspecified: Secondary | ICD-10-CM | POA: Diagnosis not present

## 2023-02-05 LAB — CBC
HCT: 27.5 % — ABNORMAL LOW (ref 39.0–52.0)
Hemoglobin: 8.3 g/dL — ABNORMAL LOW (ref 13.0–17.0)
MCH: 31.7 pg (ref 26.0–34.0)
MCHC: 30.2 g/dL (ref 30.0–36.0)
MCV: 105 fL — ABNORMAL HIGH (ref 80.0–100.0)
Platelets: 150 10*3/uL (ref 150–400)
RBC: 2.62 MIL/uL — ABNORMAL LOW (ref 4.22–5.81)
RDW: 17.9 % — ABNORMAL HIGH (ref 11.5–15.5)
WBC: 6.9 10*3/uL (ref 4.0–10.5)
nRBC: 0 % (ref 0.0–0.2)

## 2023-02-05 LAB — SAMPLE TO BLOOD BANK

## 2023-02-05 MED ORDER — EPOETIN ALFA-EPBX 10000 UNIT/ML IJ SOLN
10000.0000 [IU] | Freq: Once | INTRAMUSCULAR | Status: AC
  Administered 2023-02-05: 10000 [IU] via SUBCUTANEOUS
  Filled 2023-02-05: qty 1

## 2023-02-05 NOTE — Progress Notes (Signed)
Patient tolerated Retacrit 10,000 U injection with no complaints voiced.  Site clean and dry with no bruising or swelling noted.  No complaints of pain.  Discharged with vital signs stable and no signs or symptoms of distress noted.    Patient tolerated injection well with no complaints voiced.  Patient left ambulatory with wife in stable condition.  Vital signs stable at discharge.  Follow up as scheduled.

## 2023-02-05 NOTE — Patient Instructions (Signed)
[facility] [place]  Discharge Instructions: Thank you for choosing [facility] to provide your oncology and hematology care.  If you have a lab appointment with the Cancer Center, please come in thru the Main Entrance and check in at the main information desk.  Wear comfortable clothing and clothing appropriate for easy access to any Portacath or PICC line.   We strive to give you quality time with your provider. You may need to reschedule your appointment if you arrive late (15 or more minutes).  Arriving late affects you and other patients whose appointments are after yours.  Also, if you miss three or more appointments without notifying the office, you may be dismissed from the clinic at the provider's discretion.      For prescription refill requests, have your pharmacy contact our office and allow 72 hours for refills to be completed.    Today you received the following chemotherapy and/or immunotherapy agents Retacrit.  Epoetin Alfa Injection What is this medication? EPOETIN ALFA (e POE e tin AL fa) treats low levels of red blood cells (anemia) caused by kidney disease, chemotherapy, or HIV medications. It can also be used in people who are at risk for blood loss during surgery. It works by helping your body make more red blood cells, which reduces the need for blood transfusions. This medicine may be used for other purposes; ask your health care provider or pharmacist if you have questions. COMMON BRAND NAME(S): Epogen, Procrit, Retacrit What should I tell my care team before I take this medication? They need to know if you have any of these conditions: Blood clots Cancer Heart disease High blood pressure On dialysis Seizures Stroke An unusual or allergic reaction to epoetin alfa, albumin, benzyl alcohol, other medications, foods, dyes, or preservatives Pregnant or trying to get pregnant Breast-feeding How should I use this medication? This medication  is injected into a vein or under the skin. It is usually given by your care team in a hospital or clinic setting. It may also be given at home. If you get this medication at home, you will be taught how to prepare and give it. Use exactly as directed. Take it as directed on the prescription label at the same time every day. Keep taking it unless your care team tells you to stop. It is important that you put your used needles and syringes in a special sharps container. Do not put them in a trash can. If you do not have a sharps container, call your pharmacist or care team to get one. A special MedGuide will be given to you by the pharmacist with each prescription and refill. Be sure to read this information carefully each time. Talk to your care team about the use of this medication in children. While this medication may be used in children as young as 1 month of age for selected conditions, precautions do apply. Overdosage: If you think you have taken too much of this medicine contact a poison control center or emergency room at once. NOTE: This medicine is only for you. Do not share this medicine with others. What if I miss a dose? If you miss a dose, take it as soon as you can. If it is almost time for your next dose, take only that dose. Do not take double or extra doses. What may interact with this medication? Darbepoetin alfa Methoxy polyethylene glycol-epoetin beta This list may not describe all possible interactions. Give your health care provider   a list of all the medicines, herbs, non-prescription drugs, or dietary supplements you use. Also tell them if you smoke, drink alcohol, or use illegal drugs. Some items may interact with your medicine. What should I watch for while using this medication? Visit your care team for regular checks on your progress. Check your blood pressure as directed. Know what your blood pressure should be and when to contact your care team. Your condition will be  monitored carefully while you are receiving this medication. You may need blood work while taking this medication. What side effects may I notice from receiving this medication? Side effects that you should report to your care team as soon as possible: Allergic reactions--skin rash, itching, hives, swelling of the face, lips, tongue, or throat Blood clot--pain, swelling, or warmth in the leg, shortness of breath, chest pain Heart attack--pain or tightness in the chest, shoulders, arms, or jaw, nausea, shortness of breath, cold or clammy skin, feeling faint or lightheaded Increase in blood pressure Rash, fever, and swollen lymph nodes Redness, blistering, peeling, or loosening of the skin, including inside the mouth Seizures Stroke--sudden numbness or weakness of the face, arm, or leg, trouble speaking, confusion, trouble walking, loss of balance or coordination, dizziness, severe headache, change in vision Side effects that usually do not require medical attention (report to your care team if they continue or are bothersome): Bone, joint, or muscle pain Cough Headache Nausea Pain, redness, or irritation at injection site This list may not describe all possible side effects. Call your doctor for medical advice about side effects. You may report side effects to FDA at 1-800-FDA-1088. Where should I keep my medication? Keep out of the reach of children and pets. Store in a refrigerator. Do not freeze. Do not shake. Protect from light. Keep this medication in the original container until you are ready to take it. See product for storage information. Get rid of any unused medication after the expiration date. To get rid of medications that are no longer needed or have expired: Take the medication to a medication take-back program. Check with your pharmacy or law enforcement to find a location. If you cannot return the medication, ask your pharmacist or care team how to get rid of the medication  safely. NOTE: This sheet is a summary. It may not cover all possible information. If you have questions about this medicine, talk to your doctor, pharmacist, or health care provider.  2023 Elsevier/Gold Standard (2022-02-04 00:00:00)       To help prevent nausea and vomiting after your treatment, we encourage you to take your nausea medication as directed.  BELOW ARE SYMPTOMS THAT SHOULD BE REPORTED IMMEDIATELY: *FEVER GREATER THAN 100.4 F (38 C) OR HIGHER *CHILLS OR SWEATING *NAUSEA AND VOMITING THAT IS NOT CONTROLLED WITH YOUR NAUSEA MEDICATION *UNUSUAL SHORTNESS OF BREATH *UNUSUAL BRUISING OR BLEEDING *URINARY PROBLEMS (pain or burning when urinating, or frequent urination) *BOWEL PROBLEMS (unusual diarrhea, constipation, pain near the anus) TENDERNESS IN MOUTH AND THROAT WITH OR WITHOUT PRESENCE OF ULCERS (sore throat, sores in mouth, or a toothache) UNUSUAL RASH, SWELLING OR PAIN  UNUSUAL VAGINAL DISCHARGE OR ITCHING   Items with * indicate a potential emergency and should be followed up as soon as possible or go to the Emergency Department if any problems should occur.  Please show the CHEMOTHERAPY ALERT CARD or IMMUNOTHERAPY ALERT CARD at check-in to the Emergency Department and triage nurse.  Should you have questions after your visit or need to cancel or reschedule   your appointment, please contact [facility] [place] [phone]  and follow the prompts.  Office hours are 8:00 a.m. to 4:30 p.m. Monday - Friday. Please note that voicemails left after 4:00 p.m. may not be returned until the following business day.  We are closed weekends and major holidays. You have access to a nurse at all times for urgent questions. Please call the main number to the clinic [phone] and follow the prompts.  For any non-urgent questions, you may also contact your provider using MyChart. We now offer e-Visits for anyone 18 and older to request care online for non-urgent symptoms.  For details visit [email or web address].   Also download the MyChart app! Go to the app store, search "MyChart", open the app, select [place], and log in with your MyChart username and password.   

## 2023-02-06 ENCOUNTER — Inpatient Hospital Stay: Payer: Medicare Other

## 2023-02-10 ENCOUNTER — Encounter: Payer: Self-pay | Admitting: Student

## 2023-02-10 ENCOUNTER — Ambulatory Visit: Payer: Medicare Other | Attending: Student | Admitting: Student

## 2023-02-10 VITALS — BP 120/56 | HR 65 | Ht 72.0 in | Wt 227.6 lb

## 2023-02-10 DIAGNOSIS — I480 Paroxysmal atrial fibrillation: Secondary | ICD-10-CM | POA: Insufficient documentation

## 2023-02-10 DIAGNOSIS — Z01818 Encounter for other preprocedural examination: Secondary | ICD-10-CM

## 2023-02-10 DIAGNOSIS — I7143 Infrarenal abdominal aortic aneurysm, without rupture: Secondary | ICD-10-CM | POA: Diagnosis present

## 2023-02-10 DIAGNOSIS — E782 Mixed hyperlipidemia: Secondary | ICD-10-CM

## 2023-02-10 DIAGNOSIS — D539 Nutritional anemia, unspecified: Secondary | ICD-10-CM | POA: Diagnosis present

## 2023-02-10 DIAGNOSIS — I10 Essential (primary) hypertension: Secondary | ICD-10-CM

## 2023-02-10 DIAGNOSIS — I2510 Atherosclerotic heart disease of native coronary artery without angina pectoris: Secondary | ICD-10-CM | POA: Insufficient documentation

## 2023-02-10 NOTE — Progress Notes (Signed)
Cardiology Office Note    Date:  02/10/2023  ID:  David Duarte, DOB 11/21/1940, MRN [id number] Cardiologist: Rozann Lesches, MD    History of Present Illness:    David Duarte is a 82 y.o. 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   He was examined by Ignacia Bayley, NP in 01/2023 for preoperative cardiac clearance in regards to right total knee arthroplasty. He denied any chest pain but did report chronic dyspnea on exertion. Given his prior abnormal stress test and symptoms, a cardiac catheterization was recommended for definitive evaluation. This was performed by Dr. 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 His anatomy was not well-suited for revascularization and given his diffuse disease, anemia and lack of angina it was recommended to continue medical therapy. The report mentioned his case would be reviewed at the Heart Team conference and I did review with Dr. Saunders Revel and he said medical management was again recommended.   In talking with the patient and his wife today, he reports having baseline dyspnea on exertion but denies any acute changes in this. No specific orthopnea, PND or pitting edema. He denies any recent chest pain or palpitations.  He is followed closely by Hematology and receives Retacrit injections with occasional transfusions. No recent melena, hematochezia or hematuria. No complications regarding his recent cardiac catheterization. He has not followed up with his Orthopedist recently but was previously planning to undergo knee replacement as discussed  above.  Studies Reviewed:   EKG: EKG is not ordered today.  Echocardiogram: 06/2022 IMPRESSIONS     1. Left ventricular ejection fraction, by estimation, is 60 to 65%. The  left ventricle has normal function. The left ventricle has no regional  wall motion abnormalities. There is mild left ventricular hypertrophy.  Left ventricular diastolic parameters  are consistent with Grade II diastolic dysfunction (pseudonormalization).  The average left ventricular global longitudinal strain is -20.5 %. The  global longitudinal strain is normal.   2. Right ventricular systolic function is normal. The right ventricular  size is mildly enlarged. There is moderately elevated pulmonary artery  systolic pressure. The estimated right ventricular systolic pressure is  123XX123 mmHg.   3. The mitral valve is grossly normal. Mild mitral valve regurgitation.   4. The aortic valve is tricuspid. There is moderate calcification of the  aortic valve. Aortic valve regurgitation is not visualized. Mild aortic  valve stenosis. Aortic valve area, by VTI measures 1.45 cm. Aortic valve  mean gradient measures 11.5 mmHg.   5. The inferior vena cava is dilated in size with >50% respiratory  variability, suggesting right atrial pressure of 8 mmHg.   Comparison(s): Prior images unable to be directly viewed.    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 Normal left ventricular systolic function (LVEF > 65%). Mildly elevated left and right heart filling pressures. Normal Fick cardiac output/index.   Recommendations: Coronary anatomy is not well-suited to revascularization, given diffuse disease with marked aneurysmal dilation, particularly in the proximal/mid LAD.  Will review at [facility] conference  this week, though given lack  of angina and other comorbidities (particularly severe anemia of uncertain etiology), I would favor medical therapy. Secondary prevention of coronary artery disease.   Physical Exam:   VS:  BP (!) 120/56   Pulse 65   Ht 6' (1.829 m)   Wt 227 lb 9.6 oz (103.2 kg)   SpO2 98%   BMI 30.87 kg/m    Wt Readings from Last 3 Encounters:  02/10/23 227 lb 9.6 oz (103.2 kg)  01/28/23 228 lb (103.4 kg)  01/22/23 228 lb 12.8 oz (103.8 kg)     GEN: Pleasant male appearing in no acute distress NECK: No JVD; No carotid bruits CARDIAC: RRR, no murmurs, rubs, gallops RESPIRATORY:  Clear to auscultation without rales, wheezing or rhonchi  ABDOMEN: Appears non-distended. No obvious abdominal masses. EXTREMITIES: No clubbing or cyanosis. No pitting edema.  Distal pedal pulses are 2+ bilaterally. Radial cath site without ecchymosis or evidence of a hematoma.    Assessment and Plan:   1. CAD/Preoperative Cardiac Clearance for Knee Replacement - Recent catheterization showed multivessel CAD as outlined above and anatomy was not well-suited for revascularization and given his diffuse disease, anemia and lack of angina it was recommended to continue medical therapy. Thankfully, he continues to deny any anginal symptoms. Continue Atorvastatin 20 mg daily. He is not on ASA given the need for anticoagulation. - Catheterization was performed as part of his preoperative cardiac clearance and given his catheterization results, he would be at intermediate-risk from a cardiac perspective. His RCRI risk is overall 6.6% risk of a major cardiac event. No further cardiac testing is indicated prior to surgery. Today's note will be forwarded to his surgeon's office. He is awaiting input from Hematology as well given his anemia. Previously cleared by pharmacy to hold Eliquis for 3 days prior to surgery (see phone note from 01/14/2023).   2. Paroxysmal Atrial Fibrillation - He denies any persistent palpitations and his heart  rate is well-controlled in the 60's today without the use of AV nodal blocking agents. - As discussed during his prior office visit, he is currently on Eliquis 2.5 mg twice daily but the appropriate dose based off his age, weight and kidney function is 5 mg twice daily. I will reach out to Dr. Domenic Polite to see if his dose can be adjusted to 5 mg twice daily. As discussed above, he denies any evidence of active bleeding and prior GI workup was unrevealing. Followed closely by Hematology.  3. HTN - His blood pressure is well-controlled at 120/56 during today's visit. Continue current medical therapy with Lisinopril 5 mg daily.  4. HLD - FLP in 12/2022 showed total cholesterol of 118, triglycerides 178, HDL 34 and LDL 54. Continue Atorvastatin 20mg  daily.   5. Abdominal Aortic and Bilateral Iliac Artery Aneurysms  - He is s/p EVAR in 10/2021. Followed by [facility] at [facility] with plans for repeat imaging in 6 months.   6. Anemia - Prior GI work-up unrevealing by review of notes. Followed by Hematology and receives Retacrit injections. Hgb at 8.3 by most recent check on 02/05/2023.   Signed, Erma Heritage, PA-C

## 2023-02-10 NOTE — Patient Instructions (Signed)
Medication Instructions:  Your physician recommends that you continue on your current medications as directed. Please refer to the Current Medication list given to you today.  *If you need a refill on your cardiac medications before your next appointment, please call your pharmacy*   Lab Work: NONE   If you have labs (blood work) drawn today and your tests are completely normal, you will receive your results only by: MyChart Message (if you have MyChart) OR A paper copy in the mail If you have any lab test that is abnormal or we need to change your treatment, we will call you to review the results.   Testing/Procedures: NONE    Follow-Up: At [facility], you and your health needs are our priority.  As part of our continuing mission to provide you with exceptional heart care, we have created designated Provider Care Teams.  These Care Teams include your primary Cardiologist (physician) and Advanced Practice Providers (APPs -  Physician Assistants and Nurse Practitioners) who all work together to provide you with the care you need, when you need it.  We recommend signing up for the patient portal called "MyChart".  Sign up information is provided on this After Visit Summary.  MyChart is used to connect with patients for Virtual Visits (Telemedicine).  Patients are able to view lab/test results, encounter notes, upcoming appointments, etc.  Non-urgent messages can be sent to your provider as well.   To learn more about what you can do with MyChart, go to [email or web address].    Your next appointment:   6 month(s)  Provider:   Samuel McDowell, MD    Other Instructions Thank you for choosing [facility]!    

## 2023-02-11 ENCOUNTER — Telehealth: Payer: Self-pay | Admitting: Student

## 2023-02-11 MED ORDER — APIXABAN 5 MG PO TABS: 5.0000 mg | ORAL_TABLET | Freq: Two times a day (BID) | ORAL | 3 refills | Status: DC

## 2023-02-11 NOTE — Telephone Encounter (Signed)
   Please let the patient know I reviewed his medications with Dr. Domenic Polite and he was in agreement with increasing Eliquis to 5mg  BID since this is the appropriate dose. He will need an updated Rx sent in as well.   Thanks,  Erma Heritage, PA-C 02/11/2023, 4:37 PM

## 2023-02-11 NOTE — Telephone Encounter (Signed)
Patient notified and verbalized understanding. Patient had no further questions or concerns at this time.  

## 2023-02-12 ENCOUNTER — Inpatient Hospital Stay: Payer: Medicare Other

## 2023-02-12 ENCOUNTER — Inpatient Hospital Stay: Payer: Medicare Other | Attending: Hematology | Admitting: Hematology

## 2023-02-12 DIAGNOSIS — D631 Anemia in chronic kidney disease: Secondary | ICD-10-CM

## 2023-02-12 DIAGNOSIS — E611 Iron deficiency: Secondary | ICD-10-CM | POA: Diagnosis not present

## 2023-02-12 DIAGNOSIS — I480 Paroxysmal atrial fibrillation: Secondary | ICD-10-CM | POA: Diagnosis not present

## 2023-02-12 DIAGNOSIS — D649 Anemia, unspecified: Secondary | ICD-10-CM

## 2023-02-12 DIAGNOSIS — D509 Iron deficiency anemia, unspecified: Secondary | ICD-10-CM | POA: Insufficient documentation

## 2023-02-12 DIAGNOSIS — Z7901 Long term (current) use of anticoagulants: Secondary | ICD-10-CM | POA: Insufficient documentation

## 2023-02-12 DIAGNOSIS — K7460 Unspecified cirrhosis of liver: Secondary | ICD-10-CM | POA: Diagnosis not present

## 2023-02-12 DIAGNOSIS — N1832 Chronic kidney disease, stage 3b: Secondary | ICD-10-CM | POA: Diagnosis not present

## 2023-02-12 DIAGNOSIS — Z86711 Personal history of pulmonary embolism: Secondary | ICD-10-CM | POA: Insufficient documentation

## 2023-02-12 DIAGNOSIS — D500 Iron deficiency anemia secondary to blood loss (chronic): Secondary | ICD-10-CM

## 2023-02-12 DIAGNOSIS — D696 Thrombocytopenia, unspecified: Secondary | ICD-10-CM | POA: Diagnosis not present

## 2023-02-12 DIAGNOSIS — Z79899 Other long term (current) drug therapy: Secondary | ICD-10-CM | POA: Diagnosis not present

## 2023-02-12 LAB — SAMPLE TO BLOOD BANK

## 2023-02-12 LAB — CBC
HCT: 26.4 % — ABNORMAL LOW (ref 39.0–52.0)
Hemoglobin: 8 g/dL — ABNORMAL LOW (ref 13.0–17.0)
MCH: 31.6 pg (ref 26.0–34.0)
MCHC: 30.3 g/dL (ref 30.0–36.0)
MCV: 104.3 fL — ABNORMAL HIGH (ref 80.0–100.0)
Platelets: 159 10*3/uL (ref 150–400)
RBC: 2.53 MIL/uL — ABNORMAL LOW (ref 4.22–5.81)
RDW: 18.4 % — ABNORMAL HIGH (ref 11.5–15.5)
WBC: 6 10*3/uL (ref 4.0–10.5)
nRBC: 0 % (ref 0.0–0.2)

## 2023-02-12 MED ORDER — EPOETIN ALFA-EPBX 10000 UNIT/ML IJ SOLN: 10000.0000 [IU] | Freq: Once | INTRAMUSCULAR | Status: DC

## 2023-02-12 MED ORDER — EPOETIN ALFA-EPBX 10000 UNIT/ML IJ SOLN
10000.0000 [IU] | Freq: Once | INTRAMUSCULAR | Status: AC
  Administered 2023-02-12: 10000 [IU] via SUBCUTANEOUS
  Filled 2023-02-12: qty 1

## 2023-02-12 NOTE — Progress Notes (Signed)
HGB 8.0 . David Duarte presents today for injection per the provider's orders.  Retacrit 10,000 units administration without incident; injection site WNL; see MAR for injection details.  Patient tolerated procedure well and without incident.  No questions or complaints noted at this time.

## 2023-02-12 NOTE — Patient Instructions (Signed)
[place]  Discharge Instructions: Thank you for choosing [facility] to provide your oncology and hematology care.  If you have a lab appointment with the [facility] - please note that after April 8th, 2024, all labs will be drawn in the cancer center.  You do not have to check in or register with the main entrance as you have in the past but will complete your check-in in the cancer center.  Wear comfortable clothing and clothing appropriate for easy access to any Portacath or PICC line.   We strive to give you quality time with your provider. You may need to reschedule your appointment if you arrive late (15 or more minutes).  Arriving late affects you and other patients whose appointments are after yours.  Also, if you miss three or more appointments without notifying the office, you may be dismissed from the clinic at the provider's discretion.      For prescription refill requests, have your pharmacy contact our office and allow 72 hours for refills to be completed.    Today you received the following chemotherapy and/or immunotherapy agents Retacrit .  Epoetin Alfa Injection What is this medication? EPOETIN ALFA (e POE e tin AL fa) treats low levels of red blood cells (anemia) caused by kidney disease, chemotherapy, or HIV medications. It can also be used in people who are at risk for blood loss during surgery. It works by Building control surveyor make more red blood cells, which reduces the need for blood transfusions. This medicine may be used for other purposes; ask your health care provider or pharmacist if you have questions. COMMON BRAND NAME(S): Epogen, Procrit, Retacrit What should I tell my care team before I take this medication? They need to know if you have any of these conditions: Blood clots Cancer Heart disease High blood pressure On dialysis Seizures Stroke An unusual or allergic reaction to epoetin alfa, albumin, benzyl alcohol, other  medications, foods, dyes, or preservatives Pregnant or trying to get pregnant Breast-feeding How should I use this medication? This medication is injected into a vein or under the skin. It is usually given by your care team in a hospital or clinic setting. It may also be given at home. If you get this medication at home, you will be taught how to prepare and give it. Use exactly as directed. Take it as directed on the prescription label at the same time every day. Keep taking it unless your care team tells you to stop. It is important that you put your used needles and syringes in a special sharps container. Do not put them in a trash can. If you do not have a sharps container, call your pharmacist or care team to get one. A special MedGuide will be given to you by the pharmacist with each prescription and refill. Be sure to read this information carefully each time. Talk to your care team about the use of this medication in children. While this medication may be used in children as young as 53 month of age for selected conditions, precautions do apply. Overdosage: If you think you have taken too much of this medicine contact a poison control center or emergency room at once. NOTE: This medicine is only for you. Do not share this medicine with others. What if I miss a dose? If you miss a dose, take it as soon as you can. If it is almost time for your next dose, take only that dose.  Do not take double or extra doses. What may interact with this medication? Darbepoetin alfa Methoxy polyethylene glycol-epoetin beta This list may not describe all possible interactions. Give your health care provider a list of all the medicines, herbs, non-prescription drugs, or dietary supplements you use. Also tell them if you smoke, drink alcohol, or use illegal drugs. Some items may interact with your medicine. What should I watch for while using this medication? Visit your care team for regular checks on your  progress. Check your blood pressure as directed. Know what your blood pressure should be and when to contact your care team. Your condition will be monitored carefully while you are receiving this medication. You may need blood work while taking this medication. What side effects may I notice from receiving this medication? Side effects that you should report to your care team as soon as possible: Allergic reactions--skin rash, itching, hives, swelling of the face, lips, tongue, or throat Blood clot--pain, swelling, or warmth in the leg, shortness of breath, chest pain Heart attack--pain or tightness in the chest, shoulders, arms, or jaw, nausea, shortness of breath, cold or clammy skin, feeling faint or lightheaded Increase in blood pressure Rash, fever, and swollen lymph nodes Redness, blistering, peeling, or loosening of the skin, including inside the mouth Seizures Stroke--sudden numbness or weakness of the face, arm, or leg, trouble speaking, confusion, trouble walking, loss of balance or coordination, dizziness, severe headache, change in vision Side effects that usually do not require medical attention (report to your care team if they continue or are bothersome): Bone, joint, or muscle pain Cough Headache Nausea Pain, redness, or irritation at injection site This list may not describe all possible side effects. Call your doctor for medical advice about side effects. You may report side effects to FDA at 1-800-FDA-1088. Where should I keep my medication? Keep out of the reach of children and pets. Store in a refrigerator. Do not freeze. Do not shake. Protect from light. Keep this medication in the original container until you are ready to take it. See product for storage information. Get rid of any unused medication after the expiration date. To get rid of medications that are no longer needed or have expired: Take the medication to a medication take-back program. Check with your  pharmacy or law enforcement to find a location. If you cannot return the medication, ask your pharmacist or care team how to get rid of the medication safely. NOTE: This sheet is a summary. It may not cover all possible information. If you have questions about this medicine, talk to your doctor, pharmacist, or health care provider.  2023 Elsevier/Gold Standard (2022-02-04 00:00:00)       To help prevent nausea and vomiting after your treatment, we encourage you to take your nausea medication as directed.  BELOW ARE SYMPTOMS THAT SHOULD BE REPORTED IMMEDIATELY: *FEVER GREATER THAN 100.4 F (38 C) OR HIGHER *CHILLS OR SWEATING *NAUSEA AND VOMITING THAT IS NOT CONTROLLED WITH YOUR NAUSEA MEDICATION *UNUSUAL SHORTNESS OF BREATH *UNUSUAL BRUISING OR BLEEDING *URINARY PROBLEMS (pain or burning when urinating, or frequent urination) *BOWEL PROBLEMS (unusual diarrhea, constipation, pain near the anus) TENDERNESS IN MOUTH AND THROAT WITH OR WITHOUT PRESENCE OF ULCERS (sore throat, sores in mouth, or a toothache) UNUSUAL RASH, SWELLING OR PAIN  UNUSUAL VAGINAL DISCHARGE OR ITCHING   Items with * indicate a potential emergency and should be followed up as soon as possible or go to the Emergency Department if any problems should occur.  Please  show the CHEMOTHERAPY ALERT CARD or IMMUNOTHERAPY ALERT CARD at check-in to the Emergency Department and triage nurse.  Should you have questions after your visit or need to cancel or reschedule your appointment, please contact [facility] [phone]  and follow the prompts.  Office hours are 8:00 a.m. to 4:30 p.m. Monday - Friday. Please note that voicemails left after 4:00 p.m. may not be returned until the following business day.  We are closed weekends and major holidays. You have access to a nurse at all times for urgent questions. Please call the main number to the clinic [phone] and follow the prompts.  For any non-urgent  questions, you may also contact your provider using MyChart. We now offer e-Visits for anyone 82 and older to request care online for non-urgent symptoms. For details visit [email or web address].   Also download the MyChart app! Go to the app store, search "MyChart", open the app, select , and log in with your MyChart username and password.

## 2023-02-13 ENCOUNTER — Inpatient Hospital Stay: Payer: Medicare Other

## 2023-02-13 ENCOUNTER — Telehealth: Payer: Self-pay | Admitting: Pharmacist

## 2023-02-13 ENCOUNTER — Ambulatory Visit (INDEPENDENT_AMBULATORY_CARE_PROVIDER_SITE_OTHER): Payer: No Typology Code available for payment source | Admitting: Pharmacist

## 2023-02-13 DIAGNOSIS — Z794 Long term (current) use of insulin: Secondary | ICD-10-CM

## 2023-02-13 DIAGNOSIS — E119 Type 2 diabetes mellitus without complications: Secondary | ICD-10-CM

## 2023-02-13 DIAGNOSIS — I152 Hypertension secondary to endocrine disorders: Secondary | ICD-10-CM

## 2023-02-13 MED ORDER — TRULICITY 0.75 MG/0.5ML ~~LOC~~ SOAJ
0.7500 mg | SUBCUTANEOUS | 1 refills | Status: DC
Start: 2023-02-13 — End: 2023-02-17

## 2023-02-13 NOTE — Progress Notes (Signed)
02/13/2023 Name: David Duarte MRN: [id number] DOB: 12-22-1940  Chief Complaint  Patient presents with   Diabetes    David Duarte is a 82 y.o. year old male who presented for a telephone visit.   They were referred to the pharmacist by their PCP for assistance in managing diabetes.   Subjective:  Care Team: Primary Care Provider: Sonny Masters, FNP ; Next Scheduled Visit: 04/07/23   Medication Access/Adherence  Current Pharmacy:    Diabetes  Current medications: tresiba 40 units daily (was on twice daily about 1 year ago) Medications tried in the past: Glipizide, Januvia, Metformin   Current glucose readings: 100s fasting Using "easy touch glucose test" meter; testing 1-2 times daily  -Patient is testing blood sugar 6 times daily -Patient is injecting insulin 4 or more times daily -He would greatly benefit from a continuous glucose monitoring system (I.e. libre or dexcom)  Patient denies hypoglycemic s/sx including dizziness, shakiness, sweating. Patient denies hyperglycemic symptoms including polyuria, polydipsia, polyphagia, nocturia, neuropathy, blurred vision.  Current physical activity: n/a  Current medication access support: wife reports insurance in $5/month copay [facility] used   Objective:  Lab Results  Component Value Date   HGBA1C 5.9 (H) 01/06/2023    Lab Results  Component Value Date   CREATININE 1.21 01/19/2023   BUN 16 01/19/2023   NA 140 01/19/2023   K 3.9 01/19/2023   CL 102 01/19/2023   CO2 22 01/19/2023    Lab Results  Component Value Date   CHOL 118 01/06/2023   HDL 34 (L) 01/06/2023   LDLCALC 54 01/06/2023   TRIG 178 (H) 01/06/2023   CHOLHDL 3.5 01/06/2023    Medications Reviewed Today     Reviewed by Danella Maiers, [facility] (Pharmacist) on 02/13/23 at 1254  Med List Status: <None>   Medication Order Taking? Sig Documenting Provider Last Dose Status Informant  acetaminophen (TYLENOL) 500 MG tablet  [id number] No Take 500 mg by mouth every 6 (six) hours as needed for moderate pain. [provider] Taking Active Spouse/Significant Other  apixaban (ELIQUIS) 5 MG TABS tablet [id number] No Take 1 tablet (5 mg total) by mouth 2 (two) times daily. Ellsworth Lennox, PA-C Taking Active   atorvastatin (LIPITOR) 20 MG tablet [id number] No Take 20 mg by mouth in the morning. [provider] Taking Active Spouse/Significant Other  Choline Fenofibrate (FENOFIBRIC ACID) 135 MG CPDR [id number] No Take 1 capsule by mouth daily. [provider] Taking Active   donepezil (ARICEPT) 10 MG tablet [id number] No Take 10 mg by mouth in the morning. [provider] Taking Active Spouse/Significant Other  escitalopram (LEXAPRO) 20 MG tablet [id number] No Take 20 mg by mouth in the morning. [provider] Taking Active Spouse/Significant Other  furosemide (LASIX) 20 MG tablet [id number] No TAKE ONE (1) TABLET BY MOUTH EVERY DAY Jonelle Sidle, MD Taking Active Spouse/Significant Other  glucose blood test strip [id number] No EVERY DAY [provider] Taking Active Spouse/Significant Other  Insulin Pen Needle 32G X 4 MM MISC [id number] No USE AS DIRECTED [provider] Taking Active Spouse/Significant Other  lisinopril (ZESTRIL) 5 MG tablet [id number] No Take 1 tablet (5 mg total) by mouth daily. Sonny Masters, FNP Taking Active Spouse/Significant Other  omeprazole (PRILOSEC) 40 MG capsule [id number] No Take 40 mg by mouth daily. [provider] Taking Active Spouse/Significant Other  ondansetron (ZOFRAN) 4 MG tablet [id number] No TAKE 1 TABLET BY MOUTH 3 TIMES DAILY  BEFORE MEALS David Duarte, Gerrit Friendsobert M, MD Taking Active   pantoprazole (PROTONIX) 40 MG tablet [id number] No Take 1 tablet (40 mg total) by mouth 2 (two) times daily. Corbin Adeourk, Robert M, MD Taking Active Spouse/Significant Other  TRESIBA FLEXTOUCH 100 UNIT/ML SOPN FlexTouch Pen [id number] No Inject 40 Units  into the skin in the morning. [provider] Taking Active Spouse/Significant Other  triamcinolone cream (KENALOG) 0.1 % [id number] No Apply 1 Application topically 2 (two) times daily as needed (skin irritation/rash). [provider] Taking Active Spouse/Significant Other  TRUEplus Lancets 28G MISC [id number] No Apply 1 each topically 3 (three) times daily. [provider] Taking Active Spouse/Significant Other  ULTICARE MICRO PEN NEEDLES 32G X 4 MM MISC [id number] No  [provider] Taking Active Spouse/Significant Other              Assessment/Plan:   Diabetes: - Currently controlled --A1c 5.9%  Patient with extensive hypoglycemia issues in the past; would like to decrease amount of insulin & consider GLP1 - Reviewed long term cardiovascular and renal outcomes of uncontrolled blood sugar - Reviewed goal A1c, goal fasting, and goal 2 hour post prandial glucose - Reviewed dietary modifications including healthy plate method - Reviewed lifestyle modifications including: - Recommend to check glucose fasting daily or if symptomatic  - Recommend to see if GLP1 is covered under insurance.  We can potentially decrease/discontinue insulin and do GLP1 weekly.  Given the long-term use of insulin, it may not be possible to completely stop insulin.  Denies personal and family history of Medullary thyroid cancer (MTC) -I will f/u with patient and wife re: insurance next week.  Would decrease insulin amount once GLP1 is started.     Follow Up Plan: 02/17/23  Kieth BrightlyJulie Dattero Jemarion Roycroft, PharmD, BCACP Clinical Pharmacist, [facility]

## 2023-02-13 NOTE — Telephone Encounter (Signed)
Patient requesting 90-day refills be sent [facility] (cheaper) Informed patient that PCP to determine which meds to send in vs specialist Last PCP visit 01/28/23 Routing to clinical

## 2023-02-17 ENCOUNTER — Telehealth: Payer: Medicare Other

## 2023-02-17 MED ORDER — FENOFIBRIC ACID 135 MG PO CPDR: 1.0000 | DELAYED_RELEASE_CAPSULE | Freq: Every day | ORAL | 0 refills | Status: DC

## 2023-02-17 MED ORDER — ESCITALOPRAM OXALATE 20 MG PO TABS: 20.0000 mg | ORAL_TABLET | Freq: Every morning | ORAL | 0 refills | Status: DC

## 2023-02-17 MED ORDER — FUROSEMIDE 20 MG PO TABS: 20.0000 mg | ORAL_TABLET | Freq: Every day | ORAL | 0 refills | Status: DC

## 2023-02-17 MED ORDER — TRULICITY 0.75 MG/0.5ML ~~LOC~~ SOAJ
0.7500 mg | SUBCUTANEOUS | 1 refills | Status: DC
Start: 2023-02-17 — End: 2023-03-17

## 2023-02-17 MED ORDER — ATORVASTATIN CALCIUM 20 MG PO TABS: 20.0000 mg | ORAL_TABLET | Freq: Every morning | ORAL | 0 refills | Status: DC

## 2023-02-17 MED ORDER — DONEPEZIL HCL 10 MG PO TABS: 10.0000 mg | ORAL_TABLET | Freq: Every morning | ORAL | 0 refills | Status: DC

## 2023-02-17 MED ORDER — OMEPRAZOLE 40 MG PO CPDR: 40.0000 mg | DELAYED_RELEASE_CAPSULE | Freq: Every day | ORAL | 0 refills | Status: DC

## 2023-02-17 MED ORDER — PANTOPRAZOLE SODIUM 40 MG PO TBEC: 40.0000 mg | DELAYED_RELEASE_TABLET | Freq: Two times a day (BID) | ORAL | 0 refills | Status: DC

## 2023-02-17 MED ORDER — APIXABAN 5 MG PO TABS: 5.0000 mg | ORAL_TABLET | Freq: Two times a day (BID) | ORAL | 0 refills | Status: DC

## 2023-02-17 MED ORDER — LISINOPRIL 5 MG PO TABS
5.0000 mg | ORAL_TABLET | Freq: Every day | ORAL | 0 refills | Status: DC
Start: 2023-02-17 — End: 2023-05-27

## 2023-02-17 NOTE — Telephone Encounter (Signed)
Patient's wife stated all of the meds need to be sent to [facility] for 90day

## 2023-02-17 NOTE — Telephone Encounter (Signed)
Wife aware all refills has been sent

## 2023-02-18 ENCOUNTER — Other Ambulatory Visit: Payer: Self-pay | Admitting: Family Medicine

## 2023-02-18 NOTE — Telephone Encounter (Signed)
Name from pharmacy: PANTOPRAZOLE TAB 40MG  DR   Pharmacy comment: What is the current therapy? omeprazole or pantoprazole?
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
## 2023-02-19 ENCOUNTER — Inpatient Hospital Stay: Payer: Medicare Other

## 2023-02-19 ENCOUNTER — Other Ambulatory Visit: Payer: Self-pay

## 2023-02-19 ENCOUNTER — Inpatient Hospital Stay: Payer: Medicare Other | Admitting: Physician Assistant

## 2023-02-19 VITALS — BP 142/66 | HR 77 | Temp 97.9°F | Resp 20

## 2023-02-19 DIAGNOSIS — N1832 Chronic kidney disease, stage 3b: Secondary | ICD-10-CM | POA: Diagnosis not present

## 2023-02-19 DIAGNOSIS — D631 Anemia in chronic kidney disease: Secondary | ICD-10-CM

## 2023-02-19 DIAGNOSIS — D649 Anemia, unspecified: Secondary | ICD-10-CM

## 2023-02-19 DIAGNOSIS — D500 Iron deficiency anemia secondary to blood loss (chronic): Secondary | ICD-10-CM | POA: Diagnosis not present

## 2023-02-19 DIAGNOSIS — D509 Iron deficiency anemia, unspecified: Secondary | ICD-10-CM | POA: Diagnosis not present

## 2023-02-19 DIAGNOSIS — D539 Nutritional anemia, unspecified: Secondary | ICD-10-CM | POA: Diagnosis not present

## 2023-02-19 LAB — CBC WITH DIFFERENTIAL/PLATELET
Abs Immature Granulocytes: 0.3 10*3/uL — ABNORMAL HIGH (ref 0.00–0.07)
Basophils Absolute: 0 10*3/uL (ref 0.0–0.1)
Basophils Relative: 0 %
Eosinophils Absolute: 0.1 10*3/uL (ref 0.0–0.5)
Eosinophils Relative: 2 %
HCT: 26.6 % — ABNORMAL LOW (ref 39.0–52.0)
Hemoglobin: 8.2 g/dL — ABNORMAL LOW (ref 13.0–17.0)
Immature Granulocytes: 5 %
Lymphocytes Relative: 21 %
Lymphs Abs: 1.4 10*3/uL (ref 0.7–4.0)
MCH: 32.3 pg (ref 26.0–34.0)
MCHC: 30.8 g/dL (ref 30.0–36.0)
MCV: 104.7 fL — ABNORMAL HIGH (ref 80.0–100.0)
Monocytes Absolute: 1.9 10*3/uL — ABNORMAL HIGH (ref 0.1–1.0)
Monocytes Relative: 28 %
Neutro Abs: 3 10*3/uL (ref 1.7–7.7)
Neutrophils Relative %: 44 %
Platelets: 145 10*3/uL — ABNORMAL LOW (ref 150–400)
RBC: 2.54 MIL/uL — ABNORMAL LOW (ref 4.22–5.81)
RDW: 18.9 % — ABNORMAL HIGH (ref 11.5–15.5)
WBC: 6.7 10*3/uL (ref 4.0–10.5)
nRBC: 0 % (ref 0.0–0.2)

## 2023-02-19 LAB — COMPREHENSIVE METABOLIC PANEL
ALT: 7 U/L (ref 0–44)
AST: 10 U/L — ABNORMAL LOW (ref 15–41)
Albumin: 3.8 g/dL (ref 3.5–5.0)
Alkaline Phosphatase: 44 U/L (ref 38–126)
Anion gap: 6 (ref 5–15)
BUN: 16 mg/dL (ref 8–23)
CO2: 24 mmol/L (ref 22–32)
Calcium: 8.4 mg/dL — ABNORMAL LOW (ref 8.9–10.3)
Chloride: 104 mmol/L (ref 98–111)
Creatinine, Ser: 1.32 mg/dL — ABNORMAL HIGH (ref 0.61–1.24)
GFR, Estimated: 55 mL/min — ABNORMAL LOW (ref >60)
Glucose, Bld: 139 mg/dL — ABNORMAL HIGH (ref 70–99)
Potassium: 4 mmol/L (ref 3.5–5.1)
Sodium: 134 mmol/L — ABNORMAL LOW (ref 135–145)
Total Bilirubin: 1.2 mg/dL (ref 0.3–1.2)
Total Protein: 7.8 g/dL (ref 6.5–8.1)

## 2023-02-19 LAB — SAMPLE TO BLOOD BANK

## 2023-02-19 LAB — IRON AND TIBC
Iron: 66 ug/dL (ref 45–182)
Saturation Ratios: 15 % — ABNORMAL LOW (ref 17.9–39.5)
TIBC: 446 ug/dL (ref 250–450)
UIBC: 380 ug/dL

## 2023-02-19 LAB — FERRITIN: Ferritin: 343 ng/mL — ABNORMAL HIGH (ref 24–336)

## 2023-02-19 MED ORDER — EPOETIN ALFA-EPBX 20000 UNIT/ML IJ SOLN
20000.0000 [IU] | Freq: Once | INTRAMUSCULAR | Status: AC
  Administered 2023-02-19: 20000 [IU] via SUBCUTANEOUS
  Filled 2023-02-19: qty 1

## 2023-02-19 NOTE — Patient Instructions (Addendum)
[facility] at [facility] **VISIT SUMMARY & IMPORTANT INSTRUCTIONS **   You were seen today by Rojelio Brenner PA-C for your anemia.    Your anemia is likely due to multiple causes... CAUSE OF ANEMIA PLAN  Chronic Kidney Disease makes it difficult for your body to make blood cells. Weekly Retacrit injections   Possible bleeding from your stomach or intestines Continue follow-up with Dr. Jena Gauss (GI specialist)  Possible bone marrow disorder (??) If your blood does not improve on Retacrit injections, we will consider checking a bone marrow biopsy   OTHER SPECIALISTS: Please call the office of Dr. Jena Gauss [facility] at [phone]) to schedule your follow-up visit and disucss your ongoing GI issues. We have referred you to a kidney doctor (Dr. Wolfgang Phoenix) for your chronic kidney diease.  If you have not heard form them in a week, please call our office to make sure the referral went through.  FOLLOW-UP APPOINTMENTS: - We will check your blood once a week. - We will give you a Retacrit shot once a week (same day as your blood check).  We will give you blood transfusions as needed. - We will see you for follow-up visit in 2 months.  ** Thank you for trusting me with your healthcare!  I strive to provide all of my patients with quality care at each visit.  If you receive a survey for this visit, I would be so grateful to you for taking the time to provide feedback.  Thank you in advance!  ~ Ahmed Inniss                   Dr. Doreatha Massed   &   Rojelio Brenner, PA-C   - - - - - - - - - - - - - - - - - -    Thank you for choosing [facility] at [facility] to provide your oncology and hematology care.  To afford each patient quality time with our provider, please arrive at least 15 minutes before your scheduled appointment time.   If you have a lab appointment with the Cancer Center please come in thru the Main Entrance  and check in at the main information desk.  You need to re-schedule your appointment should you arrive 10 or more minutes late.  We strive to give you quality time with our providers, and arriving late affects you and other patients whose appointments are after yours.  Also, if you no show three or more times for appointments you may be dismissed from the clinic at the providers discretion.     Again, thank you for choosing [facility].  Our hope is that these requests will decrease the amount of time that you wait before being seen by our physicians.       _____________________________________________________________  Should you have questions after your visit to [facility], please contact our office at [phone] and follow the prompts.  Our office hours are 8:00 a.m. and 4:30 p.m. Monday - Friday.  Please note that voicemails left after 4:00 p.m. may not be returned until the following business day.  We are closed weekends and major holidays.  You do have access to a nurse 24-7, just call the main number to the clinic [phone] and do not press any options, hold on the line and a nurse will answer the phone.    For prescription refill requests, have your pharmacy contact  our office and allow 72 hours.

## 2023-02-19 NOTE — Progress Notes (Signed)
Patient tolerated Retacrit injection with no complaints voiced. Hgb 8.2. Site clean and dry with no bruising or swelling noted.  No complaints of pain.  Discharged with vital signs stable and no signs or symptoms of distress noted.

## 2023-02-19 NOTE — Patient Instructions (Signed)
[facility]  Discharge Instructions: Thank you for choosing [facility] to provide your oncology and hematology care.  If you have a lab appointment with the Cancer Center - please note that after April 8th, 2024, all labs will be drawn in the cancer center.  You do not have to check in or register with the main entrance as you have in the past but will complete your check-in in the cancer center.  Wear comfortable clothing and clothing appropriate for easy access to any Portacath or PICC line.   We strive to give you quality time with your provider. You may need to reschedule your appointment if you arrive late (15 or more minutes).  Arriving late affects you and other patients whose appointments are after yours.  Also, if you miss three or more appointments without notifying the office, you may be dismissed from the clinic at the provider's discretion.      For prescription refill requests, have your pharmacy contact our office and allow 72 hours for refills to be completed.    Today you received the following Retacrit injection.   Epoetin Alfa Injection What is this medication? EPOETIN ALFA (e POE e tin AL fa) treats low levels of red blood cells (anemia) caused by kidney disease, chemotherapy, or HIV medications. It can also be used in people who are at risk for blood loss during surgery. It works by Systems analysthelping your body make more red blood cells, which reduces the need for blood transfusions. This medicine may be used for other purposes; ask your health care provider or pharmacist if you have questions. COMMON BRAND NAME(S): Epogen, Procrit, Retacrit What should I tell my care team before I take this medication? They need to know if you have any of these conditions: Blood clots Cancer Heart disease High blood pressure On dialysis Seizures Stroke An unusual or allergic reaction to epoetin alfa, albumin, benzyl alcohol, other medications, foods, dyes, or  preservatives Pregnant or trying to get pregnant Breast-feeding How should I use this medication? This medication is injected into a vein or under the skin. It is usually given by your care team in a hospital or clinic setting. It may also be given at home. If you get this medication at home, you will be taught how to prepare and give it. Use exactly as directed. Take it as directed on the prescription label at the same time every day. Keep taking it unless your care team tells you to stop. It is important that you put your used needles and syringes in a special sharps container. Do not put them in a trash can. If you do not have a sharps container, call your pharmacist or care team to get one. A special MedGuide will be given to you by the pharmacist with each prescription and refill. Be sure to read this information carefully each time. Talk to your care team about the use of this medication in children. While this medication may be used in children as young as 1 month of age for selected conditions, precautions do apply. Overdosage: If you think you have taken too much of this medicine contact a poison control center or emergency room at once. NOTE: This medicine is only for you. Do not share this medicine with others. What if I miss a dose? If you miss a dose, take it as soon as you can. If it is almost time for your next dose, take only that dose. Do not take  double or extra doses. What may interact with this medication? Darbepoetin alfa Methoxy polyethylene glycol-epoetin beta This list may not describe all possible interactions. Give your health care provider a list of all the medicines, herbs, non-prescription drugs, or dietary supplements you use. Also tell them if you smoke, drink alcohol, or use illegal drugs. Some items may interact with your medicine. What should I watch for while using this medication? Visit your care team for regular checks on your progress. Check your blood pressure  as directed. Know what your blood pressure should be and when to contact your care team. Your condition will be monitored carefully while you are receiving this medication. You may need blood work while taking this medication. What side effects may I notice from receiving this medication? Side effects that you should report to your care team as soon as possible: Allergic reactions--skin rash, itching, hives, swelling of the face, lips, tongue, or throat Blood clot--pain, swelling, or warmth in the leg, shortness of breath, chest pain Heart attack--pain or tightness in the chest, shoulders, arms, or jaw, nausea, shortness of breath, cold or clammy skin, feeling faint or lightheaded Increase in blood pressure Rash, fever, and swollen lymph nodes Redness, blistering, peeling, or loosening of the skin, including inside the mouth Seizures Stroke--sudden numbness or weakness of the face, arm, or leg, trouble speaking, confusion, trouble walking, loss of balance or coordination, dizziness, severe headache, change in vision Side effects that usually do not require medical attention (report to your care team if they continue or are bothersome): Bone, joint, or muscle pain Cough Headache Nausea Pain, redness, or irritation at injection site This list may not describe all possible side effects. Call your doctor for medical advice about side effects. You may report side effects to FDA at 1-800-FDA-1088. Where should I keep my medication? Keep out of the reach of children and pets. Store in a refrigerator. Do not freeze. Do not shake. Protect from light. Keep this medication in the original container until you are ready to take it. See product for storage information. Get rid of any unused medication after the expiration date. To get rid of medications that are no longer needed or have expired: Take the medication to a medication take-back program. Check with your pharmacy or law enforcement to find a  location. If you cannot return the medication, ask your pharmacist or care team how to get rid of the medication safely. NOTE: This sheet is a summary. It may not cover all possible information. If you have questions about this medicine, talk to your doctor, pharmacist, or health care provider.  2023 Elsevier/Gold Standard (2022-02-04 00:00:00)    To help prevent nausea and vomiting after your treatment, we encourage you to take your nausea medication as directed.  BELOW ARE SYMPTOMS THAT SHOULD BE REPORTED IMMEDIATELY: *FEVER GREATER THAN 100.4 F (38 C) OR HIGHER *CHILLS OR SWEATING *NAUSEA AND VOMITING THAT IS NOT CONTROLLED WITH YOUR NAUSEA MEDICATION *UNUSUAL SHORTNESS OF BREATH *UNUSUAL BRUISING OR BLEEDING *URINARY PROBLEMS (pain or burning when urinating, or frequent urination) *BOWEL PROBLEMS (unusual diarrhea, constipation, pain near the anus) TENDERNESS IN MOUTH AND THROAT WITH OR WITHOUT PRESENCE OF ULCERS (sore throat, sores in mouth, or a toothache) UNUSUAL RASH, SWELLING OR PAIN  UNUSUAL VAGINAL DISCHARGE OR ITCHING   Items with * indicate a potential emergency and should be followed up as soon as possible or go to the Emergency Department if any problems should occur.  Please show the CHEMOTHERAPY ALERT CARD or  IMMUNOTHERAPY ALERT CARD at check-in to the Emergency Department and triage nurse.  Should you have questions after your visit or need to cancel or reschedule your appointment, please contact [facility]ENTER AT [facility] [phone]  and follow the prompts.  Office hours are 8:00 a.m. to 4:30 p.m. Monday - Friday. Please note that voicemails left after 4:00 p.m. may not be returned until the following business day.  We are closed weekends and major holidays. You have access to a nurse at all times for urgent questions. Please call the main number to the clinic [phone] and follow the prompts.  For any non-urgent questions, you may also contact your provider  using MyChart. We now offer e-Visits for anyone 34 and older to request care online for non-urgent symptoms. For details visit [email or web address].   Also download the MyChart app! Go to the app store, search "MyChart", open the app, select [place], and log in with your MyChart username and password.

## 2023-02-20 ENCOUNTER — Inpatient Hospital Stay: Payer: Medicare Other

## 2023-02-24 ENCOUNTER — Ambulatory Visit (INDEPENDENT_AMBULATORY_CARE_PROVIDER_SITE_OTHER): Payer: No Typology Code available for payment source | Admitting: Pharmacist

## 2023-02-24 ENCOUNTER — Encounter: Payer: Self-pay | Admitting: Pharmacist

## 2023-02-24 DIAGNOSIS — E119 Type 2 diabetes mellitus without complications: Secondary | ICD-10-CM

## 2023-02-24 DIAGNOSIS — Z794 Long term (current) use of insulin: Secondary | ICD-10-CM

## 2023-02-24 NOTE — Progress Notes (Signed)
    S:    PCP: Gilford Silvius, FNP  82 y.o. male who presents for diabetes evaluation, education, and management.  PMH is significant for T2DM, pAF, h/o AAA, HTN, CAD, HLD, and CKD with anemia.  Patient was ast seen by Primary Care Provider, Gilford Silvius, FNP, on 01/28/2023.   Today, patient is in good spirits, spoke with his wife [name] who handles his medications. States they received Trulicity a few days ago through [facility] mail order and had a $0 copay. He has not yet started Trulicity due to N/V associated with his anemia. He plans to start once this resolves.   Current diabetes medications include: Tresiba 40 units daily, Trulicity 0.75 mg weekly (not yet started due to N/V associated with anemia)  Current hypertension medications include: lisinopril 5 mg, Lasix 20 mg daily Current hyperlipidemia medications include: atorvastatin 20 mg daily, fenofibrate 135 mg daily  Patient reports adherence to taking all medications as prescribed.   Insurance coverage: [facility])   Patient denies hypoglycemic events.  Reported home fasting blood sugars: 117, 122  Reported 2 hour post-meal/random blood sugars: not checking.  Patient denies nocturia (nighttime urination).  Patient denies neuropathy (nerve pain). Patient denies visual changes. Patient reports self foot exams.   O:  Lab Results  Component Value Date   HGBA1C 5.9 (H) 01/06/2023   Lipid Panel     Component Value Date/Time   CHOL 118 01/06/2023 1622   TRIG 178 (H) 01/06/2023 1622   HDL 34 (L) 01/06/2023 1622   CHOLHDL 3.5 01/06/2023 1622   LDLCALC 54 01/06/2023 1622    Clinical Atherosclerotic Cardiovascular Disease (ASCVD): Yes  The ASCVD Risk score (Arnett DK, et al., 2019) failed to calculate for the following reasons:   The 2019 ASCVD risk score is only valid for ages 23 to 25   A/P: Diabetes longstanding, currently controlled based on A1c (5.9%). Patient is able to verbalize appropriate hypoglycemia management  plan. Medication adherence appears appropriate; although he has not yet started Trulicity. Of note, patient does have history of hypoglycemia. -Continued basal insulin Tresiba (insulin degludec) 40 units daily. Instructed patient's wife to call the clinic and decrease Tresiba to 34 units if hypoglycemia occurs once he begins Trulicity.  -Continued GLP-1 Trulicity (dulaglutide) 0.75 mg weekly. Patient plans to start once his N/V from anemia has resolved.  -Patient educated on purpose, proper use, and potential adverse effects of insulin and Trulicity.  -Extensively discussed pathophysiology of diabetes, recommended lifestyle interventions, dietary effects on blood sugar control.  -Counseled on s/sx of and management of hypoglycemia.  -Next A1c anticipated August 2024.   ASCVD risk - secondary prevention in patient with diabetes. Last LDL is at goal of <70 mg/dL (54 in February 9629) -Continued atorvastatin 20 mg daily and fenofibrate 135 mg daily.   Hypertension longstanding currently close to goal. Blood pressure goal of <130/80 mmHg. Medication adherence appropriate. -Continued lisinopril 5 mg and Lasix 20 mg daily.   Patient verbalized understanding of treatment plan.  Total time counseling 20 minutes.    Follow-up:  Pharmacist 1 month. PCP clinic visit in May 2024.   Valeda Malm, Pharm.D. PGY-2 Ambulatory Care Pharmacy Resident  Kieth Brightly, PharmD, BCACP Clinical Pharmacist, [facility]

## 2023-02-27 ENCOUNTER — Inpatient Hospital Stay: Payer: Medicare Other

## 2023-02-27 VITALS — BP 126/69 | HR 60 | Temp 97.6°F | Resp 20

## 2023-02-27 DIAGNOSIS — N1832 Chronic kidney disease, stage 3b: Secondary | ICD-10-CM | POA: Diagnosis not present

## 2023-02-27 DIAGNOSIS — D500 Iron deficiency anemia secondary to blood loss (chronic): Secondary | ICD-10-CM

## 2023-02-27 DIAGNOSIS — D509 Iron deficiency anemia, unspecified: Secondary | ICD-10-CM | POA: Diagnosis not present

## 2023-02-27 DIAGNOSIS — D539 Nutritional anemia, unspecified: Secondary | ICD-10-CM

## 2023-02-27 LAB — CBC
HCT: 28.5 % — ABNORMAL LOW (ref 39.0–52.0)
Hemoglobin: 8.6 g/dL — ABNORMAL LOW (ref 13.0–17.0)
MCH: 31.7 pg (ref 26.0–34.0)
MCHC: 30.2 g/dL (ref 30.0–36.0)
MCV: 105.2 fL — ABNORMAL HIGH (ref 80.0–100.0)
Platelets: 147 10*3/uL — ABNORMAL LOW (ref 150–400)
RBC: 2.71 MIL/uL — ABNORMAL LOW (ref 4.22–5.81)
RDW: 18.6 % — ABNORMAL HIGH (ref 11.5–15.5)
WBC: 7.9 10*3/uL (ref 4.0–10.5)
nRBC: 0 % (ref 0.0–0.2)

## 2023-02-27 LAB — SAMPLE TO BLOOD BANK

## 2023-02-27 MED ORDER — EPOETIN ALFA-EPBX 20000 UNIT/ML IJ SOLN
20000.0000 [IU] | Freq: Once | INTRAMUSCULAR | Status: AC
  Administered 2023-02-27: 20000 [IU] via SUBCUTANEOUS
  Filled 2023-02-27: qty 1

## 2023-02-27 NOTE — Patient Instructions (Signed)
[facility] [place]  Discharge Instructions: Thank you for choosing [facility] to provide your oncology and hematology care.  If you have a lab appointment with the Cancer Center - please note that after April 8th, 2024, all labs will be drawn in the cancer center.  You do not have to check in or register with the main entrance as you have in the past but will complete your check-in in the cancer center.  Wear comfortable clothing and clothing appropriate for easy access to any Portacath or PICC line.   We strive to give you quality time with your provider. You may need to reschedule your appointment if you arrive late (15 or more minutes).  Arriving late affects you and other patients whose appointments are after yours.  Also, if you miss three or more appointments without notifying the office, you may be dismissed from the clinic at the provider's discretion.      For prescription refill requests, have your pharmacy contact our office and allow 72 hours for refills to be completed.    Today you received the following chemotherapy and/or immunotherapy agents Retacrit      To help prevent nausea and vomiting after your treatment, we encourage you to take your nausea medication as directed.  BELOW ARE SYMPTOMS THAT SHOULD BE REPORTED IMMEDIATELY: *FEVER GREATER THAN 100.4 F (38 C) OR HIGHER *CHILLS OR SWEATING *NAUSEA AND VOMITING THAT IS NOT CONTROLLED WITH YOUR NAUSEA MEDICATION *UNUSUAL SHORTNESS OF BREATH *UNUSUAL BRUISING OR BLEEDING *URINARY PROBLEMS (pain or burning when urinating, or frequent urination) *BOWEL PROBLEMS (unusual diarrhea, constipation, pain near the anus) TENDERNESS IN MOUTH AND THROAT WITH OR WITHOUT PRESENCE OF ULCERS (sore throat, sores in mouth, or a toothache) UNUSUAL RASH, SWELLING OR PAIN  UNUSUAL VAGINAL DISCHARGE OR ITCHING   Items with * indicate a potential emergency and should be followed up as soon as possible or go to the  Emergency Department if any problems should occur.  Please show the CHEMOTHERAPY ALERT CARD or IMMUNOTHERAPY ALERT CARD at check-in to the Emergency Department and triage nurse.  Should you have questions after your visit or need to cancel or reschedule your appointment, please contact [facility] [place] [phone]  and follow the prompts.  Office hours are 8:00 a.m. to 4:30 p.m. Monday - Friday. Please note that voicemails left after 4:00 p.m. may not be returned until the following business day.  We are closed weekends and major holidays. You have access to a nurse at all times for urgent questions. Please call the main number to the clinic [phone] and follow the prompts.  For any non-urgent questions, you may also contact your provider using MyChart. We now offer e-Visits for anyone 18 and older to request care online for non-urgent symptoms. For details visit [email or web address].   Also download the MyChart app! Go to the app store, search "MyChart", open the app, select [place], and log in with your MyChart username and password.   

## 2023-02-27 NOTE — Progress Notes (Signed)
Patient presents today for Retacrit per providers order.  Vital signs WNL.  HGB 8.6.  Patient has no new complaints at this time.  Stable duirng administration without incident; injection site WNL; see MAR for injection details.  Patient tolerated procedure well and without incident.  No questions or complaints noted at this time.

## 2023-03-02 ENCOUNTER — Telehealth: Payer: Self-pay | Admitting: Family Medicine

## 2023-03-02 NOTE — Telephone Encounter (Signed)
Called patient to schedule Medicare Annual Wellness Visit (AWV). Left message for patient to call back and schedule Medicare Annual Wellness Visit (AWV).  Last date of AWV: due 11/10/2009 awvi per [facility]  Please schedule an appointment at any time with either Laura or Courtney, NHA's. .  If any questions, please contact me at [phone].  Thank you,  Stephanie,  AMB Clinical Support [facility] AWV Program Direct Dial ??[phone]    

## 2023-03-05 ENCOUNTER — Inpatient Hospital Stay: Payer: Medicare Other

## 2023-03-05 ENCOUNTER — Emergency Department (HOSPITAL_COMMUNITY)
Admission: EM | Admit: 2023-03-05 | Discharge: 2023-03-05 | Disposition: A | Discharge status: Home / Self Care | Payer: Medicare Other | Attending: Emergency Medicine | Admitting: Emergency Medicine

## 2023-03-05 ENCOUNTER — Telehealth: Payer: Self-pay | Admitting: Family Medicine

## 2023-03-05 ENCOUNTER — Other Ambulatory Visit: Payer: Self-pay

## 2023-03-05 ENCOUNTER — Emergency Department (HOSPITAL_COMMUNITY): Payer: Medicare Other

## 2023-03-05 VITALS — BP 124/68 | HR 81 | Temp 98.2°F | Resp 20

## 2023-03-05 DIAGNOSIS — E119 Type 2 diabetes mellitus without complications: Secondary | ICD-10-CM | POA: Diagnosis not present

## 2023-03-05 DIAGNOSIS — D500 Iron deficiency anemia secondary to blood loss (chronic): Secondary | ICD-10-CM

## 2023-03-05 DIAGNOSIS — D631 Anemia in chronic kidney disease: Secondary | ICD-10-CM | POA: Diagnosis not present

## 2023-03-05 DIAGNOSIS — N189 Chronic kidney disease, unspecified: Secondary | ICD-10-CM | POA: Insufficient documentation

## 2023-03-05 DIAGNOSIS — Z7901 Long term (current) use of anticoagulants: Secondary | ICD-10-CM | POA: Diagnosis not present

## 2023-03-05 DIAGNOSIS — R21 Rash and other nonspecific skin eruption: Secondary | ICD-10-CM | POA: Diagnosis not present

## 2023-03-05 DIAGNOSIS — Z794 Long term (current) use of insulin: Secondary | ICD-10-CM | POA: Diagnosis not present

## 2023-03-05 DIAGNOSIS — D539 Nutritional anemia, unspecified: Secondary | ICD-10-CM

## 2023-03-05 DIAGNOSIS — I714 Abdominal aortic aneurysm, without rupture, unspecified: Secondary | ICD-10-CM | POA: Insufficient documentation

## 2023-03-05 DIAGNOSIS — R109 Unspecified abdominal pain: Secondary | ICD-10-CM | POA: Diagnosis present

## 2023-03-05 DIAGNOSIS — K5732 Diverticulitis of large intestine without perforation or abscess without bleeding: Secondary | ICD-10-CM | POA: Insufficient documentation

## 2023-03-05 DIAGNOSIS — K5792 Diverticulitis of intestine, part unspecified, without perforation or abscess without bleeding: Secondary | ICD-10-CM

## 2023-03-05 LAB — CBC
HCT: 31.7 % — ABNORMAL LOW (ref 39.0–52.0)
HCT: 30.4 % — ABNORMAL LOW (ref 39.0–52.0)
Hemoglobin: 9.6 g/dL — ABNORMAL LOW (ref 13.0–17.0)
Hemoglobin: 9.4 g/dL — ABNORMAL LOW (ref 13.0–17.0)
MCH: 31.4 pg (ref 26.0–34.0)
MCH: 31.5 pg (ref 26.0–34.0)
MCHC: 30.3 g/dL (ref 30.0–36.0)
MCHC: 30.9 g/dL (ref 30.0–36.0)
MCV: 103.6 fL — ABNORMAL HIGH (ref 80.0–100.0)
MCV: 102 fL — ABNORMAL HIGH (ref 80.0–100.0)
Platelets: 185 10*3/uL (ref 150–400)
Platelets: 184 10*3/uL (ref 150–400)
RBC: 3.06 MIL/uL — ABNORMAL LOW (ref 4.22–5.81)
RBC: 2.98 MIL/uL — ABNORMAL LOW (ref 4.22–5.81)
RDW: 18.2 % — ABNORMAL HIGH (ref 11.5–15.5)
RDW: 18.3 % — ABNORMAL HIGH (ref 11.5–15.5)
WBC: 8.8 10*3/uL (ref 4.0–10.5)
WBC: 8 10*3/uL (ref 4.0–10.5)
nRBC: 0 % (ref 0.0–0.2)
nRBC: 0 % (ref 0.0–0.2)

## 2023-03-05 LAB — COMPREHENSIVE METABOLIC PANEL
ALT: 6 U/L (ref 0–44)
AST: 11 U/L — ABNORMAL LOW (ref 15–41)
Albumin: 4.2 g/dL (ref 3.5–5.0)
Alkaline Phosphatase: 47 U/L (ref 38–126)
Anion gap: 11 (ref 5–15)
BUN: 17 mg/dL (ref 8–23)
CO2: 23 mmol/L (ref 22–32)
Calcium: 9 mg/dL (ref 8.9–10.3)
Chloride: 102 mmol/L (ref 98–111)
Creatinine, Ser: 1.26 mg/dL — ABNORMAL HIGH (ref 0.61–1.24)
GFR, Estimated: 58 mL/min — ABNORMAL LOW (ref >60)
Glucose, Bld: 175 mg/dL — ABNORMAL HIGH (ref 70–99)
Potassium: 4.1 mmol/L (ref 3.5–5.1)
Sodium: 136 mmol/L (ref 135–145)
Total Bilirubin: 1.1 mg/dL (ref 0.3–1.2)
Total Protein: 8.7 g/dL — ABNORMAL HIGH (ref 6.5–8.1)

## 2023-03-05 LAB — LIPASE, BLOOD: Lipase: 36 U/L (ref 11–51)

## 2023-03-05 LAB — SAMPLE TO BLOOD BANK

## 2023-03-05 MED ORDER — IOHEXOL 300 MG/ML  SOLN
100.0000 mL | Freq: Once | INTRAMUSCULAR | Status: AC | PRN
  Administered 2023-03-05: 100 mL via INTRAVENOUS

## 2023-03-05 MED ORDER — ONDANSETRON 4 MG PO TBDP: 4.0000 mg | ORAL_TABLET | Freq: Three times a day (TID) | ORAL | 0 refills | Status: DC | PRN

## 2023-03-05 MED ORDER — ONDANSETRON HCL 4 MG/2ML IJ SOLN
4.0000 mg | Freq: Once | INTRAMUSCULAR | Status: AC
  Administered 2023-03-05: 4 mg via INTRAVENOUS
  Filled 2023-03-05: qty 2

## 2023-03-05 MED ORDER — EPOETIN ALFA-EPBX 20000 UNIT/ML IJ SOLN
20000.0000 [IU] | Freq: Once | INTRAMUSCULAR | Status: AC
  Administered 2023-03-05: 20000 [IU] via SUBCUTANEOUS
  Filled 2023-03-05: qty 1

## 2023-03-05 MED ORDER — MORPHINE SULFATE (PF) 4 MG/ML IV SOLN
4.0000 mg | Freq: Once | INTRAVENOUS | Status: AC
  Administered 2023-03-05: 4 mg via INTRAVENOUS
  Filled 2023-03-05: qty 1

## 2023-03-05 MED ORDER — AMOXICILLIN-POT CLAVULANATE 875-125 MG PO TABS: 1.0000 | ORAL_TABLET | Freq: Two times a day (BID) | ORAL | 0 refills | Status: DC

## 2023-03-05 MED ORDER — HYDROCODONE-ACETAMINOPHEN 5-325 MG PO TABS: 2.0000 | ORAL_TABLET | ORAL | 0 refills | Status: DC | PRN

## 2023-03-05 MED ORDER — HYDROCORTISONE 1 % EX CREA: TOPICAL_CREAM | CUTANEOUS | 0 refills | Status: DC

## 2023-03-05 MED ORDER — LACTATED RINGERS IV BOLUS
500.0000 mL | Freq: Once | INTRAVENOUS | Status: AC
  Administered 2023-03-05: 500 mL via INTRAVENOUS

## 2023-03-05 NOTE — Progress Notes (Signed)
Patient presents today for Retacrit injection. Hemoglobin reviewed prior to administration. VSS tolerated without incident or complaint. See MAR for details. Patient stable during and after injection. Patient discharged in satisfactory condition with no s/s of distress noted.  

## 2023-03-05 NOTE — Patient Instructions (Signed)
[facility]  Discharge Instructions: Thank you for choosing [facility] to provide your oncology and hematology care.  If you have a lab appointment with the Cancer Center - please note that after April 8th, 2024, all labs will be drawn in the cancer center.  You do not have to check in or register with the main entrance as you have in the past but will complete your check-in in the cancer center.  Wear comfortable clothing and clothing appropriate for easy access to any Portacath or PICC line.   We strive to give you quality time with your provider. You may need to reschedule your appointment if you arrive late (15 or more minutes).  Arriving late affects you and other patients whose appointments are after yours.  Also, if you miss three or more appointments without notifying the office, you may be dismissed from the clinic at the provider's discretion.      For prescription refill requests, have your pharmacy contact our office and allow 72 hours for refills to be completed.    Today you received the following Retacrit injection, return as scheduled.   To help prevent nausea and vomiting after your treatment, we encourage you to take your nausea medication as directed.  BELOW ARE SYMPTOMS THAT SHOULD BE REPORTED IMMEDIATELY: *FEVER GREATER THAN 100.4 F (38 C) OR HIGHER *CHILLS OR SWEATING *NAUSEA AND VOMITING THAT IS NOT CONTROLLED WITH YOUR NAUSEA MEDICATION *UNUSUAL SHORTNESS OF BREATH *UNUSUAL BRUISING OR BLEEDING *URINARY PROBLEMS (pain or burning when urinating, or frequent urination) *BOWEL PROBLEMS (unusual diarrhea, constipation, pain near the anus) TENDERNESS IN MOUTH AND THROAT WITH OR WITHOUT PRESENCE OF ULCERS (sore throat, sores in mouth, or a toothache) UNUSUAL RASH, SWELLING OR PAIN  UNUSUAL VAGINAL DISCHARGE OR ITCHING   Items with * indicate a potential emergency and should be followed up as soon as possible or go to the Emergency  Department if any problems should occur.  Please show the CHEMOTHERAPY ALERT CARD or IMMUNOTHERAPY ALERT CARD at check-in to the Emergency Department and triage nurse.  Should you have questions after your visit or need to cancel or reschedule your appointment, please contact [facility] CENTER AT [facility] [phone]  and follow the prompts.  Office hours are 8:00 a.m. to 4:30 p.m. Monday - Friday. Please note that voicemails left after 4:00 p.m. may not be returned until the following business day.  We are closed weekends and major holidays. You have access to a nurse at all times for urgent questions. Please call the main number to the clinic [phone] and follow the prompts.  For any non-urgent questions, you may also contact your provider using MyChart. We now offer e-Visits for anyone 17 and older to request care online for non-urgent symptoms. For details visit [email or web address].   Also download the MyChart app! Go to the app store, search "MyChart", open the app, select [place], and log in with your MyChart username and password.

## 2023-03-05 NOTE — Discharge Instructions (Signed)
Your workup today showed you have diverticulitis.  Have sent antibiotic into the pharmacy for you.  Along with pain medication and nausea medication.  For your rash I have sent in a cream you can apply for the itching.  Follow-up with the dermatologist as discussed.  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  He would like for you to call your primary vascular surgeon and schedule a follow-up and to let them know about the CT findings.  It increased slightly in size however not significantly.  Vascular surgeon does not think you need any intervention at this time.  For any concerning symptoms return to the emergency department otherwise follow-up with your vascular surgeon, primary care provider, and gastroenterologist.

## 2023-03-05 NOTE — ED Provider Notes (Signed)
[place] EMERGENCY DEPARTMENT AT [facility] Provider Note   CSN: [id number] Arrival date & time: 03/05/23  1158     History  Chief Complaint  Patient presents with   Abdominal Pain   Emesis    David Duarte is a 82 y.o. male.  82 year old male presents today for evaluation of abdominal discomfort associated with nausea and vomiting.  This has been ongoing for 5 months.  Has undergone evaluation with gastroenterology.  Previously evaluated in the emergency department a few months ago and had a CT scan done.  He does have history of diverticulitis.  Denies any rectal bleeding, urinary complaints.  He states he has lost about 50 pounds in the past 5 months.  He has mentioned his complaints to his oncologist as well as gastroenterologist.  He states he does not have a definitive answer.  He also has a rash on the left side of his chest, and shoulder that he has mention to his oncologist but they advised him it was benign.  He is receiving Retacrit infusions.  He denies any testicular swelling, pain, groin swelling.  No prior history of hernia.  He also reports a rash that has been present for months.  Has had this evaluated by multiple physicians.  Has not seen dermatology though.  No significant change in his rash.  Does report of pruritus.  The history is provided by the patient. No language interpreter was used.       Home Medications Prior to Admission medications   Medication Sig Start Date End Date Taking? Authorizing Provider  acetaminophen (TYLENOL) 500 MG tablet Take 500 mg by mouth every 6 (six) hours as needed for moderate pain.    [provider]  apixaban (ELIQUIS) 5 MG TABS tablet Take 1 tablet (5 mg total) by mouth 2 (two) times daily. 02/17/23   Sonny Masters, FNP  atorvastatin (LIPITOR) 20 MG tablet Take 1 tablet (20 mg total) by mouth in the morning. 02/17/23   Sonny Masters, FNP  Choline Fenofibrate (FENOFIBRIC ACID) 135 MG CPDR Take 1 capsule by  mouth daily. 02/17/23   Sonny Masters, FNP  donepezil (ARICEPT) 10 MG tablet Take 1 tablet (10 mg total) by mouth in the morning. 02/17/23   Sonny Masters, FNP  Dulaglutide (TRULICITY) 0.75 MG/0.5ML SOPN Inject 0.75 mg into the skin once a week. Patient not taking: Reported on 02/24/2023 02/17/23   Sonny Masters, FNP  escitalopram (LEXAPRO) 20 MG tablet Take 1 tablet (20 mg total) by mouth in the morning. 02/17/23   Sonny Masters, FNP  furosemide (LASIX) 20 MG tablet Take 1 tablet (20 mg total) by mouth daily. 02/17/23   Sonny Masters, FNP  glucose blood test strip EVERY DAY 04/22/18   [provider]  Insulin Pen Needle 32G X 4 MM MISC USE AS DIRECTED 01/20/18   [provider]  lisinopril (ZESTRIL) 5 MG tablet Take 1 tablet (5 mg total) by mouth daily. 02/17/23   Sonny Masters, FNP  omeprazole (PRILOSEC) 40 MG capsule Take 1 capsule (40 mg total) by mouth daily. 02/17/23   Sonny Masters, FNP  ondansetron (ZOFRAN) 4 MG tablet TAKE 1 TABLET BY MOUTH 3 TIMES DAILY BEFORE MEALS 01/22/23   Rourk, Gerrit Friends, MD  pantoprazole (PROTONIX) 40 MG tablet TAKE 1 TABLET TWICE A DAY. 02/19/23   Rakes, Doralee Albino, FNP  TRESIBA FLEXTOUCH 100 UNIT/ML SOPN FlexTouch Pen Inject 40 Units into the skin in  the morning. 12/31/16   [provider]  triamcinolone cream (KENALOG) 0.1 % Apply 1 Application topically 2 (two) times daily as needed (skin irritation/rash).    [provider]  TRUEplus Lancets 28G MISC Apply 1 each topically 3 (three) times daily. 10/04/22   [provider]  Stann Ore MICRO PEN NEEDLES 32G X 4 MM MISC  05/03/18   [provider]      Allergies    Patient has no known allergies.    Review of Systems   Review of Systems  Respiratory:  Negative for cough and shortness of breath.   Cardiovascular:  Negative for chest pain.  Gastrointestinal:  Positive for abdominal pain, nausea and vomiting.  Genitourinary:  Negative for dysuria.  Skin:  Positive for rash.   Neurological:  Negative for light-headedness.  All other systems reviewed and are negative.   Physical Exam Updated Vital Signs BP 112/77 (BP Location: Right Arm)   Pulse 94   Temp 98.2 F (36.8 C) (Oral)   Resp 18   Ht 6' (1.829 m)   Wt 99.8 kg   SpO2 98%   BMI 29.84 kg/m  Physical Exam Vitals and nursing note reviewed.  Constitutional:      General: He is not in acute distress.    Appearance: Normal appearance. He is not ill-appearing.  HENT:     Head: Normocephalic and atraumatic.     Nose: Nose normal.  Eyes:     General: No scleral icterus.    Extraocular Movements: Extraocular movements intact.     Conjunctiva/sclera: Conjunctivae normal.  Cardiovascular:     Rate and Rhythm: Normal rate. Rhythm irregular.     Pulses: Normal pulses.  Pulmonary:     Effort: Pulmonary effort is normal. No respiratory distress.     Breath sounds: Normal breath sounds. No wheezing or rales.  Abdominal:     General: There is no distension.     Palpations: Abdomen is soft.     Tenderness: There is no abdominal tenderness. There is no guarding.  Musculoskeletal:        General: Normal range of motion.     Cervical back: Normal range of motion.     Right lower leg: No edema.     Left lower leg: No edema.  Skin:    General: Skin is warm and dry.  Neurological:     General: No focal deficit present.     Mental Status: He is alert. Mental status is at baseline.     ED Results / Procedures / Treatments   Labs (all labs ordered are listed, but only abnormal results are displayed) Labs Reviewed  COMPREHENSIVE METABOLIC PANEL - Abnormal; Notable for the following components:      Result Value   Glucose, Bld 175 (*)    Creatinine, Ser 1.26 (*)    Total Protein 8.7 (*)    AST 11 (*)    GFR, Estimated 58 (*)    All other components within normal limits  CBC - Abnormal; Notable for the following components:   RBC 2.98 (*)    Hemoglobin 9.4 (*)    HCT 30.4 (*)    MCV 102.0 (*)     RDW 18.3 (*)    All other components within normal limits  LIPASE, BLOOD  URINALYSIS, ROUTINE W REFLEX MICROSCOPIC    EKG None  Radiology No results found.  Procedures Procedures    Medications Ordered in ED Medications  morphine (PF) 4 MG/ML injection 4  mg (has no administration in time range)  ondansetron (ZOFRAN) injection 4 mg (has no administration in time range)  lactated ringers bolus 500 mL (has no administration in time range)    ED Course/ Medical Decision Making/ A&P                             Medical Decision Making Amount and/or Complexity of Data Reviewed Labs: ordered. Radiology: ordered.  Risk Prescription drug management.   Medical Decision Making / ED Course   This patient presents to the ED for concern of abdominal pain, nausea, and vomiting, this involves an extensive number of treatment options, and is a complaint that carries with it a high risk of complications and morbidity.  The differential diagnosis includes diverticulitis, gastroenteritis, hernia, UTI  MDM: 82 year old male with past medical history as above presents today for evaluation of abdominal pain, vomiting.  Symptoms ongoing for past 5 months.  He endorses lack of appetite.  Lost 50 pounds in this timeframe.  He is frustrated with not being able to get an answer in terms of his symptoms and no improvement of his health.  Overall he is stable appearing.  On Retacrit infusions for anemia.  CBC without leukocytosis.  Hemoglobin at 9.4 which is improved from baseline.  CMP with creatinine of 1.26 which is not worse from his baseline.  Glucose 175.  Will add on CT abdomen pelvis, UA, and obtain an EKG given the complaints of exertional dyspnea to ensure this is not cardiac ischemia.  He has history of paroxysmal A-fib.  EKG does not demonstrate rate controlled A-fib rhythm.  CBC is without leukocytosis.  Hemoglobin is at 9.4 which is above his baseline.  Creatinine of 1.26, glucose 175  otherwise without acute findings.  CT abdomen pelvis does show evidence of diverticulitis.  Will start patient on Augmentin.  Will give pain medication to keep on hand for symptom control.  It also shows increase in size of aneurysmal sac.  Case discussed with vascular surgeon Dr. Karin Lieu who recommends follow-up with his primary vascular surgeon.  According to Dr. Karin Lieu the size is stable.  Unlikely to be source of patient's symptoms given that is been ongoing for 5 months and there is also evidence of diverticulitis on CT scan.  Patient is in agreement with this plan.  In terms of his rash I have prescribed hydrocortisone.  He states he has a dermatology office close to where he lives and he would like to follow-up with them.  Patient voices understanding and is in agreement with plan.   Lab Tests: -I ordered, reviewed, and interpreted labs.   The pertinent results include:   Labs Reviewed  COMPREHENSIVE METABOLIC PANEL - Abnormal; Notable for the following components:      Result Value   Glucose, Bld 175 (*)    Creatinine, Ser 1.26 (*)    Total Protein 8.7 (*)    AST 11 (*)    GFR, Estimated 58 (*)    All other components within normal limits  CBC - Abnormal; Notable for the following components:   RBC 2.98 (*)    Hemoglobin 9.4 (*)    HCT 30.4 (*)    MCV 102.0 (*)    RDW 18.3 (*)    All other components within normal limits  LIPASE, BLOOD  URINALYSIS, ROUTINE W REFLEX MICROSCOPIC      EKG  EKG Interpretation  Date/Time:    Ventricular Rate:  PR Interval:    QRS Duration:   QT Interval:    QTC Calculation:   R Axis:     Text Interpretation:           Imaging Studies ordered: I ordered imaging studies including CT abdomen pelvis with contrast I independently visualized and interpreted imaging. I agree with the radiologist interpretation   Medicines ordered and prescription drug management: Meds ordered this encounter  Medications   morphine (PF) 4 MG/ML  injection 4 mg   ondansetron (ZOFRAN) injection 4 mg   lactated ringers bolus 500 mL    -I have reviewed the patients home medicines and have made adjustments as needed  Critical interventions Pain control  Consultations Obtained: I requested consultation with the vascular surgery,  and discussed lab and imaging findings as well as pertinent plan - they recommend: Outpatient follow-up.  No acute intervention   Reevaluation: After the interventions noted above, I reevaluated the patient and found that they have :improved  Co morbidities that complicate the patient evaluation  Past Medical History:  Diagnosis Date   Abdominal aortic aneurysm (AAA)    a. 10/2021 s/p EVAR.   Abnormal cardiovascular stress test    a. 09/2012 MV: Fixed anteroseptal and inferoseptal defects.  Possible apical ischemia; b. 06/2022 MV: Fixed inferior, inferoseptal, anteroseptal, septal defect with apical reversibility-similar to 2013 study.   Anemia in CKD (chronic kidney disease) 01/22/2023   Anxiety    Arthritis    Cirrhosis    Completed Hep A/B vaccinations in 2022   Diastolic dysfunction    a. 06/2022 Echo: EF 60-65%, no rwma, GrII DD, mildly enlarged RV w/ nl fxn. RVSP 45.85mmHg. Mild MR/AS.   Diverticulosis    GERD (gastroesophageal reflux disease)    Hiatal hernia    History of kidney stones    Hypercholesteremia    Iron deficiency anemia due to chronic blood loss 11/21/2022   Paroxysmal atrial fibrillation    PE (pulmonary embolism) 2003   Syncope    Type 2 diabetes mellitus       Dispostion: Patient is appropriate for discharge.  Discharged in stable condition.  Return precaution discussed.  Patient voices understanding and is in agreement with plan.   Final Clinical Impression(s) / ED Diagnoses Final diagnoses:  Diverticulitis  Abdominal aneurysm  Rash    Rx / DC Orders ED Discharge Orders          Ordered    amoxicillin-clavulanate (AUGMENTIN) 875-125 MG tablet  Every 12 hours         03/05/23 1708    ondansetron (ZOFRAN-ODT) 4 MG disintegrating tablet  Every 8 hours PRN        03/05/23 1708    HYDROcodone-acetaminophen (NORCO/VICODIN) 5-325 MG tablet  Every 4 hours PRN        03/05/23 1708    hydrocortisone cream 1 %        03/05/23 1708              Marita Kansas, PA-C 03/05/23 1710    Derwood Kaplan, MD 03/06/23 1001

## 2023-03-05 NOTE — Telephone Encounter (Signed)
Patient's wife left a message on my voicemail stating that her husband's readings have been 169 yesterday and 189 today  She said that he started new medication and seems too high and doesn't seem to be working per wife

## 2023-03-05 NOTE — Telephone Encounter (Signed)
Lmtcb.

## 2023-03-05 NOTE — ED Triage Notes (Signed)
Lower abd pain x 2 weeks with vomiting. Pt has lost 50lbs in 5 months. Pt had diverticulitis beginning of the year but said the pain is worse and different.

## 2023-03-09 ENCOUNTER — Telehealth: Payer: Self-pay

## 2023-03-09 NOTE — Telephone Encounter (Signed)
lmtcb

## 2023-03-09 NOTE — Transitions of Care (Post Inpatient/ED Visit) (Unsigned)
   03/09/2023  Name: David Duarte MRN: [id number] DOB: 27-Mar-1941  Today's TOC FU Call Status: Today's TOC FU Call Status:: Unsuccessul Call (1st Attempt) Unsuccessful Call (1st Attempt) Date: 03/09/23  Attempted to reach the patient regarding the most recent Inpatient/ED visit.  Follow Up Plan: Additional outreach attempts will be made to reach the patient to complete the Transitions of Care (Post Inpatient/ED visit) call.   Signature Karena Addison, LPN [facility] Nurse Health Advisor Direct Dial [phone]

## 2023-03-09 NOTE — Telephone Encounter (Signed)
Patient and wife are concerned because of blood sugar readings since patient started Trulicity one week ago.  The first day his blood sugar was 127.  Since then it has been 169, 186, 204, 193, and 260.  Normally his readings are much better than this and they would like you to know about this and also what you recommend.  They are aware you are off today and will return tomorrow.

## 2023-03-10 NOTE — Transitions of Care (Post Inpatient/ED Visit) (Unsigned)
   03/10/2023  Name: David Duarte MRN: [id number] DOB: 10/08/1941  Today's TOC FU Call Status: Today's TOC FU Call Status:: Unsuccessful Call (2nd Attempt) Unsuccessful Call (1st Attempt) Date: 03/09/23 Unsuccessful Call (2nd Attempt) Date: 03/10/23  Attempted to reach the patient regarding the most recent Inpatient/ED visit.  Follow Up Plan: Additional outreach attempts will be made to reach the patient to complete the Transitions of Care (Post Inpatient/ED visit) call.   Signature Karena Addison, LPN [facility] Nurse Health Advisor Direct Dial [phone]

## 2023-03-10 NOTE — Telephone Encounter (Signed)
No answer, no voicemail.

## 2023-03-11 ENCOUNTER — Inpatient Hospital Stay: Payer: Medicare Other

## 2023-03-11 ENCOUNTER — Inpatient Hospital Stay: Payer: Medicare Other | Attending: Hematology

## 2023-03-11 ENCOUNTER — Other Ambulatory Visit: Payer: Self-pay | Admitting: Physician Assistant

## 2023-03-11 VITALS — BP 142/71 | HR 65 | Temp 97.9°F | Resp 20

## 2023-03-11 DIAGNOSIS — D509 Iron deficiency anemia, unspecified: Secondary | ICD-10-CM | POA: Insufficient documentation

## 2023-03-11 DIAGNOSIS — D631 Anemia in chronic kidney disease: Secondary | ICD-10-CM | POA: Insufficient documentation

## 2023-03-11 DIAGNOSIS — E611 Iron deficiency: Secondary | ICD-10-CM | POA: Diagnosis not present

## 2023-03-11 DIAGNOSIS — D500 Iron deficiency anemia secondary to blood loss (chronic): Secondary | ICD-10-CM

## 2023-03-11 DIAGNOSIS — Z79899 Other long term (current) drug therapy: Secondary | ICD-10-CM | POA: Diagnosis not present

## 2023-03-11 DIAGNOSIS — N1832 Chronic kidney disease, stage 3b: Secondary | ICD-10-CM

## 2023-03-11 DIAGNOSIS — D539 Nutritional anemia, unspecified: Secondary | ICD-10-CM

## 2023-03-11 LAB — CBC
HCT: 27.4 % — ABNORMAL LOW (ref 39.0–52.0)
Hemoglobin: 8.2 g/dL — ABNORMAL LOW (ref 13.0–17.0)
MCH: 31.1 pg (ref 26.0–34.0)
MCHC: 29.9 g/dL — ABNORMAL LOW (ref 30.0–36.0)
MCV: 103.8 fL — ABNORMAL HIGH (ref 80.0–100.0)
Platelets: 161 10*3/uL (ref 150–400)
RBC: 2.64 MIL/uL — ABNORMAL LOW (ref 4.22–5.81)
RDW: 18.5 % — ABNORMAL HIGH (ref 11.5–15.5)
WBC: 7 10*3/uL (ref 4.0–10.5)
nRBC: 0 % (ref 0.0–0.2)

## 2023-03-11 LAB — SAMPLE TO BLOOD BANK

## 2023-03-11 MED ORDER — EPOETIN ALFA-EPBX 20000 UNIT/ML IJ SOLN
20000.0000 [IU] | Freq: Once | INTRAMUSCULAR | Status: AC
  Administered 2023-03-11: 20000 [IU] via SUBCUTANEOUS
  Filled 2023-03-11: qty 1

## 2023-03-11 MED ORDER — EPOETIN ALFA-EPBX 40000 UNIT/ML IJ SOLN: 30000.0000 [IU] | Freq: Once | INTRAMUSCULAR | Status: DC

## 2023-03-11 MED ORDER — EPOETIN ALFA-EPBX 10000 UNIT/ML IJ SOLN
10000.0000 [IU] | Freq: Once | INTRAMUSCULAR | Status: AC
  Administered 2023-03-11: 10000 [IU] via SUBCUTANEOUS
  Filled 2023-03-11: qty 1

## 2023-03-11 NOTE — Progress Notes (Signed)
Patient presents today for Retacrit injection. Hemoglobin reviewed prior to administration. VSS tolerated without incident or complaint. See MAR for details. Patient stable during and after injection. Patient discharged in satisfactory condition with no s/s of distress noted.  

## 2023-03-11 NOTE — Transitions of Care (Post Inpatient/ED Visit) (Signed)
   03/11/2023  Name: JASSON SIEGMANN MRN: [id number] DOB: 07-Dec-1940  Today's TOC FU Call Status: Today's TOC FU Call Status:: Unsuccessful Call (3rd Attempt) Unsuccessful Call (1st Attempt) Date: 03/09/23 Unsuccessful Call (2nd Attempt) Date: 03/10/23 Unsuccessful Call (3rd Attempt) Date: 03/11/23  Attempted to reach the patient regarding the most recent Inpatient/ED visit.  Follow Up Plan: No further outreach attempts will be made at this time. We have been unable to contact the patient.  Signature Karena Addison, LPN [facility] Nurse Health Advisor Direct Dial [phone]

## 2023-03-12 ENCOUNTER — Encounter: Payer: Self-pay | Admitting: Orthopaedic Surgery

## 2023-03-12 ENCOUNTER — Ambulatory Visit (INDEPENDENT_AMBULATORY_CARE_PROVIDER_SITE_OTHER): Payer: No Typology Code available for payment source | Admitting: Orthopaedic Surgery

## 2023-03-12 VITALS — Ht 72.0 in | Wt 220.0 lb

## 2023-03-12 DIAGNOSIS — M1711 Unilateral primary osteoarthritis, right knee: Secondary | ICD-10-CM

## 2023-03-12 DIAGNOSIS — D500 Iron deficiency anemia secondary to blood loss (chronic): Secondary | ICD-10-CM

## 2023-03-12 MED ORDER — HYDROCODONE-ACETAMINOPHEN 5-325 MG PO TABS: 2.0000 | ORAL_TABLET | ORAL | 0 refills | Status: DC | PRN
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
## 2023-03-13 NOTE — Telephone Encounter (Signed)
Wife called back and scheduled an appt for pt to see PCP on Tuesday 5/7. Wants to know what pt needs to do in the meantime because pt does not want to take the medicine anymore.

## 2023-03-13 NOTE — Telephone Encounter (Signed)
Pt has stopped Trulicity and is injecting 40u of [place] only now.  BG 100 this am. No more BGs in 200s since stopping Trulicity.

## 2023-03-16 NOTE — Telephone Encounter (Signed)
Several attempts have been made - with no call back. Patient scheduled to see Marcelino Duster tomorrow- will discus at appointment.

## 2023-03-17 ENCOUNTER — Ambulatory Visit (INDEPENDENT_AMBULATORY_CARE_PROVIDER_SITE_OTHER): Payer: No Typology Code available for payment source | Admitting: Family Medicine

## 2023-03-17 ENCOUNTER — Encounter: Payer: Self-pay | Admitting: Family Medicine

## 2023-03-17 VITALS — BP 133/72 | HR 68 | Temp 98.1°F | Ht 73.0 in | Wt 223.2 lb

## 2023-03-17 DIAGNOSIS — E119 Type 2 diabetes mellitus without complications: Secondary | ICD-10-CM

## 2023-03-17 DIAGNOSIS — I7143 Infrarenal abdominal aortic aneurysm, without rupture: Secondary | ICD-10-CM | POA: Diagnosis not present

## 2023-03-17 DIAGNOSIS — Z794 Long term (current) use of insulin: Secondary | ICD-10-CM | POA: Diagnosis not present

## 2023-03-17 MED ORDER — TRESIBA FLEXTOUCH 100 UNIT/ML ~~LOC~~ SOPN
40.0000 [IU] | PEN_INJECTOR | Freq: Every morning | SUBCUTANEOUS | 1 refills | Status: AC
Start: 2023-03-17 — End: 2023-06-15
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
## 2023-03-18 ENCOUNTER — Inpatient Hospital Stay: Payer: Medicare Other

## 2023-03-18 VITALS — BP 135/78 | HR 69 | Temp 97.7°F | Resp 16

## 2023-03-18 DIAGNOSIS — D500 Iron deficiency anemia secondary to blood loss (chronic): Secondary | ICD-10-CM

## 2023-03-18 DIAGNOSIS — N1832 Chronic kidney disease, stage 3b: Secondary | ICD-10-CM | POA: Diagnosis not present

## 2023-03-18 DIAGNOSIS — D539 Nutritional anemia, unspecified: Secondary | ICD-10-CM

## 2023-03-18 DIAGNOSIS — D509 Iron deficiency anemia, unspecified: Secondary | ICD-10-CM | POA: Diagnosis not present

## 2023-03-18 DIAGNOSIS — D631 Anemia in chronic kidney disease: Secondary | ICD-10-CM

## 2023-03-18 LAB — CBC
HCT: 25.3 % — ABNORMAL LOW (ref 39.0–52.0)
Hemoglobin: 7.5 g/dL — ABNORMAL LOW (ref 13.0–17.0)
MCH: 31.1 pg (ref 26.0–34.0)
MCHC: 29.6 g/dL — ABNORMAL LOW (ref 30.0–36.0)
MCV: 105 fL — ABNORMAL HIGH (ref 80.0–100.0)
Platelets: 157 10*3/uL (ref 150–400)
RBC: 2.41 MIL/uL — ABNORMAL LOW (ref 4.22–5.81)
RDW: 18.5 % — ABNORMAL HIGH (ref 11.5–15.5)
WBC: 5.4 10*3/uL (ref 4.0–10.5)
nRBC: 0 % (ref 0.0–0.2)

## 2023-03-18 LAB — SAMPLE TO BLOOD BANK

## 2023-03-18 LAB — PREPARE RBC (CROSSMATCH)

## 2023-03-18 MED ORDER — EPOETIN ALFA-EPBX 10000 UNIT/ML IJ SOLN
10000.0000 [IU] | Freq: Once | INTRAMUSCULAR | Status: AC
  Administered 2023-03-18: 10000 [IU] via SUBCUTANEOUS
  Filled 2023-03-18: qty 1

## 2023-03-18 MED ORDER — EPOETIN ALFA-EPBX 40000 UNIT/ML IJ SOLN: 30000.0000 [IU] | Freq: Once | INTRAMUSCULAR | Status: DC

## 2023-03-18 MED ORDER — EPOETIN ALFA-EPBX 20000 UNIT/ML IJ SOLN
20000.0000 [IU] | Freq: Once | INTRAMUSCULAR | Status: AC
  Administered 2023-03-18: 20000 [IU] via SUBCUTANEOUS
  Filled 2023-03-18: qty 1

## 2023-03-18 NOTE — Patient Instructions (Signed)
[facility] [place]  Discharge Instructions: Thank you for choosing [facility] to provide your oncology and hematology care.  If you have a lab appointment with the Cancer Center - please note that after April 8th, 2024, all labs will be drawn in the cancer center.  You do not have to check in or register with the main entrance as you have in the past but will complete your check-in in the cancer center.  Wear comfortable clothing and clothing appropriate for easy access to any Portacath or PICC line.   We strive to give you quality time with your provider. You may need to reschedule your appointment if you arrive late (15 or more minutes).  Arriving late affects you and other patients whose appointments are after yours.  Also, if you miss three or more appointments without notifying the office, you may be dismissed from the clinic at the provider's discretion.      For prescription refill requests, have your pharmacy contact our office and allow 72 hours for refills to be completed.    Today you received the following Retacrit, return as scheduled.   To help prevent nausea and vomiting after your treatment, we encourage you to take your nausea medication as directed.  BELOW ARE SYMPTOMS THAT SHOULD BE REPORTED IMMEDIATELY: *FEVER GREATER THAN 100.4 F (38 C) OR HIGHER *CHILLS OR SWEATING *NAUSEA AND VOMITING THAT IS NOT CONTROLLED WITH YOUR NAUSEA MEDICATION *UNUSUAL SHORTNESS OF BREATH *UNUSUAL BRUISING OR BLEEDING *URINARY PROBLEMS (pain or burning when urinating, or frequent urination) *BOWEL PROBLEMS (unusual diarrhea, constipation, pain near the anus) TENDERNESS IN MOUTH AND THROAT WITH OR WITHOUT PRESENCE OF ULCERS (sore throat, sores in mouth, or a toothache) UNUSUAL RASH, SWELLING OR PAIN  UNUSUAL VAGINAL DISCHARGE OR ITCHING   Items with * indicate a potential emergency and should be followed up as soon as possible or go to the Emergency Department if  any problems should occur.  Please show the CHEMOTHERAPY ALERT CARD or IMMUNOTHERAPY ALERT CARD at check-in to the Emergency Department and triage nurse.  Should you have questions after your visit or need to cancel or reschedule your appointment, please contact [facility] AT  [phone]  and follow the prompts.  Office hours are 8:00 a.m. to 4:30 p.m. Monday - Friday. Please note that voicemails left after 4:00 p.m. may not be returned until the following business day.  We are closed weekends and major holidays. You have access to a nurse at all times for urgent questions. Please call the main number to the clinic [phone] and follow the prompts.  For any non-urgent questions, you may also contact your provider using MyChart. We now offer e-Visits for anyone 18 and older to request care online for non-urgent symptoms. For details visit [email or web address].   Also download the MyChart app! Go to the app store, search "MyChart", open the app, select [place], and log in with your MyChart username and password.   

## 2023-03-18 NOTE — Progress Notes (Signed)
Patient presents today for Retacrit injection. Hemoglobin reviewed prior to administration. VSS tolerated without incident or complaint. See MAR for details. Patient stable during and after injection. Patient discharged in satisfactory condition with no s/s of distress noted.  

## 2023-03-18 NOTE — Progress Notes (Signed)
One unit of blood ordered for transfusion tomorrow per Rojelio Brenner PA-C

## 2023-03-19 ENCOUNTER — Inpatient Hospital Stay: Payer: Medicare Other

## 2023-03-19 DIAGNOSIS — D500 Iron deficiency anemia secondary to blood loss (chronic): Secondary | ICD-10-CM

## 2023-03-19 DIAGNOSIS — D509 Iron deficiency anemia, unspecified: Secondary | ICD-10-CM | POA: Diagnosis not present

## 2023-03-19 DIAGNOSIS — N1832 Chronic kidney disease, stage 3b: Secondary | ICD-10-CM | POA: Diagnosis not present

## 2023-03-19 DIAGNOSIS — D631 Anemia in chronic kidney disease: Secondary | ICD-10-CM

## 2023-03-19 MED ORDER — SODIUM CHLORIDE 0.9% IV SOLUTION
250.0000 mL | Freq: Once | INTRAVENOUS | Status: AC
  Administered 2023-03-19: 250 mL via INTRAVENOUS

## 2023-03-19 MED ORDER — DIPHENHYDRAMINE HCL 25 MG PO CAPS
25.0000 mg | ORAL_CAPSULE | Freq: Once | ORAL | Status: AC
  Administered 2023-03-19: 25 mg via ORAL
  Filled 2023-03-19: qty 1

## 2023-03-19 MED ORDER — ACETAMINOPHEN 325 MG PO TABS: 650.0000 mg | ORAL_TABLET | Freq: Once | ORAL | Status: DC

## 2023-03-20 ENCOUNTER — Encounter: Payer: Self-pay | Admitting: Internal Medicine

## 2023-03-20 ENCOUNTER — Ambulatory Visit: Payer: No Typology Code available for payment source | Admitting: Internal Medicine

## 2023-03-20 VITALS — BP 158/76 | HR 62 | Temp 97.9°F | Ht 72.0 in | Wt 225.6 lb

## 2023-03-20 DIAGNOSIS — K7469 Other cirrhosis of liver: Secondary | ICD-10-CM | POA: Diagnosis not present

## 2023-03-20 DIAGNOSIS — R634 Abnormal weight loss: Secondary | ICD-10-CM

## 2023-03-20 DIAGNOSIS — K219 Gastro-esophageal reflux disease without esophagitis: Secondary | ICD-10-CM | POA: Diagnosis not present
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
## 2023-03-22 LAB — TYPE AND SCREEN
ABO/RH(D): O POS
Antibody Screen: POSITIVE
Unit division: 0
Unit division: 0

## 2023-03-22 LAB — BPAM RBC
Blood Product Expiration Date: 202406112359
Blood Product Expiration Date: 202406112359
ISSUE DATE / TIME: 202405091037
ISSUE DATE / TIME: 202405091037
Unit Type and Rh: 5100
Unit Type and Rh: 5100

## 2023-03-23 ENCOUNTER — Telehealth: Payer: Self-pay

## 2023-03-23 NOTE — Patient Instructions (Addendum)
Abdominal ultrasound to reassess AAA.  I have offered the patient a diagnostic EGD to reassess his nausea, vomiting, aversion to food, weight loss. The risks, benefits, limitations, alternatives and imponderables have been reviewed with the patient. Potential for esophageal dilation, biopsy, etc. have also been reviewed.  Questions have been answered. All parties agreeable.   Need to stop Eliquis 2 days prior to procedure.  Need to get permission from prescriber.  He may need bridging.  Decrease Tresiba to 20 units morning before procedure.  Hold the day of the procedure as well. Early morning appointment.  Further recommendations to follow.

## 2023-03-23 NOTE — Telephone Encounter (Signed)
Pt's wife called wanting to get pt scheduled for EGD. They were under the impression that someone would be calling them today to get him scheduled. Please send orders to Mikaeel Petrow R and [facility].

## 2023-03-24 ENCOUNTER — Telehealth: Payer: Self-pay | Admitting: *Deleted

## 2023-03-24 ENCOUNTER — Telehealth: Payer: Self-pay

## 2023-03-24 NOTE — Telephone Encounter (Signed)
-----   Message from Corbin Ade, MD sent at 03/23/2023  8:23 PM EDT ----- David Duarte to make a note in his chart about BP - it was up - not feasible to re-check - don't want to get dinged

## 2023-03-24 NOTE — Telephone Encounter (Signed)
Chart has been updated with the second bp readings that were obtained before the power went out, was waiting on charting to be done.

## 2023-03-24 NOTE — Telephone Encounter (Signed)
Patient with diagnosis of afib on Eliquis for anticoagulation.    Procedure: Esophagogastroduodenoscopy (EGD) Date of procedure: TBD   CHA2DS2-VASc Score = 6   This indicates a 9.7% annual risk of stroke. The patient's score is based upon: CHF History: 1 HTN History: 1 Diabetes History: 1 Stroke History: 0 Vascular Disease History: 1 Age Score: 2 Gender Score: 0     CrCl 58 mL/min (adj BW SrCr 1.26 03/05/2023) Platelet count 157 K   Per office protocol, patient can hold Eliquis for 2 days prior to procedure.     **This guidance is not considered finalized until pre-operative APP has relayed final recommendations.**

## 2023-03-24 NOTE — Telephone Encounter (Signed)
   Patient Name: David Duarte  DOB: 1941-05-26 MRN: [id number]  Primary Cardiologist: Nona Dell, MD  Chart reviewed as part of pre-operative protocol coverage. Pre-op clearance already addressed by colleagues in earlier phone notes. To summarize recommendations:  -Per office protocol, patient can hold Eliquis for 2 days prior to procedure.  Please resume when medically stable to do so.  Medical clearance was not requested.    Will route this bundled recommendation to requesting provider via Epic fax function and remove from pre-op pool. Please call with questions.  Sharlene Dory, PA-C 03/24/2023, 4:54 PM

## 2023-03-24 NOTE — Telephone Encounter (Signed)
  Request for patient to stop medication prior to procedure or is needing cleareance  03/24/23  David Duarte 26-Jul-1941  What type of surgery is being performed? Esophagogastroduodenoscopy (EGD)  When is surgery scheduled? TBD  What type of clearance is required (medical or pharmacy to hold medication or both? medication  Are there any medications that need to be held prior to surgery and how long? Eliquis x 2 days  Name of physician performing surgery?  Dr.Rourk [facility] at [employer]: [phone] Fax: [phone]  Anethesia type (none, local, MAC, general)? MAC

## 2023-03-25 ENCOUNTER — Inpatient Hospital Stay: Payer: Medicare Other

## 2023-03-25 VITALS — BP 119/65 | HR 65 | Temp 98.0°F | Resp 18

## 2023-03-25 DIAGNOSIS — D631 Anemia in chronic kidney disease: Secondary | ICD-10-CM

## 2023-03-25 DIAGNOSIS — D500 Iron deficiency anemia secondary to blood loss (chronic): Secondary | ICD-10-CM

## 2023-03-25 DIAGNOSIS — N1832 Chronic kidney disease, stage 3b: Secondary | ICD-10-CM | POA: Diagnosis not present

## 2023-03-25 DIAGNOSIS — D509 Iron deficiency anemia, unspecified: Secondary | ICD-10-CM | POA: Diagnosis not present

## 2023-03-25 DIAGNOSIS — D539 Nutritional anemia, unspecified: Secondary | ICD-10-CM

## 2023-03-25 LAB — CBC
HCT: 28.5 % — ABNORMAL LOW (ref 39.0–52.0)
Hemoglobin: 8.5 g/dL — ABNORMAL LOW (ref 13.0–17.0)
MCH: 30.2 pg (ref 26.0–34.0)
MCHC: 29.8 g/dL — ABNORMAL LOW (ref 30.0–36.0)
MCV: 101.4 fL — ABNORMAL HIGH (ref 80.0–100.0)
Platelets: 146 10*3/uL — ABNORMAL LOW (ref 150–400)
RBC: 2.81 MIL/uL — ABNORMAL LOW (ref 4.22–5.81)
RDW: 18.9 % — ABNORMAL HIGH (ref 11.5–15.5)
WBC: 5.1 10*3/uL (ref 4.0–10.5)
nRBC: 0 % (ref 0.0–0.2)

## 2023-03-25 LAB — SAMPLE TO BLOOD BANK

## 2023-03-25 MED ORDER — EPOETIN ALFA-EPBX 20000 UNIT/ML IJ SOLN
20000.0000 [IU] | Freq: Once | INTRAMUSCULAR | Status: AC
  Administered 2023-03-25: 20000 [IU] via SUBCUTANEOUS
  Filled 2023-03-25: qty 1

## 2023-03-25 MED ORDER — EPOETIN ALFA-EPBX 40000 UNIT/ML IJ SOLN: 30000.0000 [IU] | Freq: Once | INTRAMUSCULAR | Status: DC

## 2023-03-25 MED ORDER — EPOETIN ALFA-EPBX 10000 UNIT/ML IJ SOLN
10000.0000 [IU] | Freq: Once | INTRAMUSCULAR | Status: AC
  Administered 2023-03-25: 10000 [IU] via SUBCUTANEOUS
  Filled 2023-03-25: qty 1

## 2023-03-25 NOTE — Progress Notes (Signed)
Patient presents today for Retacrit injection. Hemoglobin reviewed prior to administration. VSS tolerated without incident or complaint. See MAR for details. Patient stable during and after injection. Patient discharged in satisfactory condition with no s/s of distress noted.  

## 2023-03-25 NOTE — Telephone Encounter (Signed)
Tried to contact pt's wife [name]. VM not set up

## 2023-03-25 NOTE — Patient Instructions (Signed)
[facility] [place]  Discharge Instructions: Thank you for choosing [facility] to provide your oncology and hematology care.  If you have a lab appointment with the Cancer Center - please note that after April 8th, 2024, all labs will be drawn in the cancer center.  You do not have to check in or register with the main entrance as you have in the past but will complete your check-in in the cancer center.  Wear comfortable clothing and clothing appropriate for easy access to any Portacath or PICC line.   We strive to give you quality time with your provider. You may need to reschedule your appointment if you arrive late (15 or more minutes).  Arriving late affects you and other patients whose appointments are after yours.  Also, if you miss three or more appointments without notifying the office, you may be dismissed from the clinic at the provider's discretion.      For prescription refill requests, have your pharmacy contact our office and allow 72 hours for refills to be completed.    Today you received the following Retacrit, return as scheduled.   To help prevent nausea and vomiting after your treatment, we encourage you to take your nausea medication as directed.  BELOW ARE SYMPTOMS THAT SHOULD BE REPORTED IMMEDIATELY: *FEVER GREATER THAN 100.4 F (38 C) OR HIGHER *CHILLS OR SWEATING *NAUSEA AND VOMITING THAT IS NOT CONTROLLED WITH YOUR NAUSEA MEDICATION *UNUSUAL SHORTNESS OF BREATH *UNUSUAL BRUISING OR BLEEDING *URINARY PROBLEMS (pain or burning when urinating, or frequent urination) *BOWEL PROBLEMS (unusual diarrhea, constipation, pain near the anus) TENDERNESS IN MOUTH AND THROAT WITH OR WITHOUT PRESENCE OF ULCERS (sore throat, sores in mouth, or a toothache) UNUSUAL RASH, SWELLING OR PAIN  UNUSUAL VAGINAL DISCHARGE OR ITCHING   Items with * indicate a potential emergency and should be followed up as soon as possible or go to the Emergency Department if  any problems should occur.  Please show the CHEMOTHERAPY ALERT CARD or IMMUNOTHERAPY ALERT CARD at check-in to the Emergency Department and triage nurse.  Should you have questions after your visit or need to cancel or reschedule your appointment, please contact [facility] [place] [phone]  and follow the prompts.  Office hours are 8:00 a.m. to 4:30 p.m. Monday - Friday. Please note that voicemails left after 4:00 p.m. may not be returned until the following business day.  We are closed weekends and major holidays. You have access to a nurse at all times for urgent questions. Please call the main number to the clinic [phone] and follow the prompts.  For any non-urgent questions, you may also contact your provider using MyChart. We now offer e-Visits for anyone 18 and older to request care online for non-urgent symptoms. For details visit [email or web address].   Also download the MyChart app! Go to the app store, search "MyChart", open the app, select [facility], and log in with your MyChart username and password.   

## 2023-03-26 ENCOUNTER — Telehealth: Payer: Self-pay | Admitting: Family Medicine

## 2023-03-26 ENCOUNTER — Encounter: Payer: Self-pay | Admitting: *Deleted

## 2023-03-26 ENCOUNTER — Telehealth: Payer: No Typology Code available for payment source

## 2023-03-26 ENCOUNTER — Telehealth: Payer: Self-pay | Admitting: *Deleted

## 2023-03-26 NOTE — Telephone Encounter (Signed)
LMTRC

## 2023-03-26 NOTE — Telephone Encounter (Signed)
Pt's wife has been calling and coming to office to get procedure scheduled for him. Clearance was faxed to his PCP to hold Eliquis but she is out of office this week. Clearance was faxed through the computer and cardiology has given permission to hold the Eliquis.( I thought it would go to his PCP as she is in [facility]). Mizael Sagar C also said he called back and states he has been on Eliquis x 3 months. Please advise. Thank you

## 2023-03-26 NOTE — Telephone Encounter (Signed)
Pt's wife [name] (on [place]) informed of md message and recommendations. Stated she would let pt know.

## 2023-03-26 NOTE — Telephone Encounter (Signed)
REFERRAL REQUEST Telephone Note  Have you been seen at our office for this problem? yes (Advise that they may need an appointment with their PCP before a referral can be done)  Reason for Referral: lower stomach pain, throwing up Referral discussed with patient: yes  Best contact number of patient for referral team: [phone]    Has patient been seen by a specialist for this issue before: yes was told by Marcelino Duster to contact his gastro. Doctor and if not able to get in with him to call and let her know and she would refer him to a Gastro with [facility]  Patient provider preference for referral: [facility] Patient location preference for referral: no preference   Patient notified that referrals can take up to a week or longer to process. If they haven't heard anything within a week they should call back and speak with the referral department.

## 2023-03-26 NOTE — Telephone Encounter (Signed)
Pt's wife [name] called back and states that pt is in so much pain with his stomach. She wants to know if there is anything you can do for him. Please advise. Thank you

## 2023-03-26 NOTE — Telephone Encounter (Signed)
He has been scheduled for 04/15/23 at 2:30 pm. I know you wanted him to have an earlier morning appt but schedule is coming booked for mornings and his wife wanted to get him in ASAP.

## 2023-03-29 ENCOUNTER — Other Ambulatory Visit: Payer: Self-pay | Admitting: Family Medicine

## 2023-03-29 DIAGNOSIS — K219 Gastro-esophageal reflux disease without esophagitis: Secondary | ICD-10-CM

## 2023-03-29 DIAGNOSIS — R162 Hepatomegaly with splenomegaly, not elsewhere classified: Secondary | ICD-10-CM

## 2023-03-30 NOTE — Telephone Encounter (Signed)
Aware and verbalizes understanding per dpr.  

## 2023-04-01 ENCOUNTER — Inpatient Hospital Stay: Payer: Medicare Other

## 2023-04-01 ENCOUNTER — Telehealth: Payer: Self-pay

## 2023-04-01 VITALS — BP 127/71 | HR 52 | Temp 97.0°F | Resp 20

## 2023-04-01 DIAGNOSIS — N1832 Chronic kidney disease, stage 3b: Secondary | ICD-10-CM

## 2023-04-01 DIAGNOSIS — D509 Iron deficiency anemia, unspecified: Secondary | ICD-10-CM | POA: Diagnosis not present

## 2023-04-01 DIAGNOSIS — D500 Iron deficiency anemia secondary to blood loss (chronic): Secondary | ICD-10-CM

## 2023-04-01 DIAGNOSIS — D539 Nutritional anemia, unspecified: Secondary | ICD-10-CM

## 2023-04-01 LAB — CBC
HCT: 26.4 % — ABNORMAL LOW (ref 39.0–52.0)
Hemoglobin: 7.9 g/dL — ABNORMAL LOW (ref 13.0–17.0)
MCH: 30.5 pg (ref 26.0–34.0)
MCHC: 29.9 g/dL — ABNORMAL LOW (ref 30.0–36.0)
MCV: 101.9 fL — ABNORMAL HIGH (ref 80.0–100.0)
Platelets: 152 10*3/uL (ref 150–400)
RBC: 2.59 MIL/uL — ABNORMAL LOW (ref 4.22–5.81)
RDW: 19 % — ABNORMAL HIGH (ref 11.5–15.5)
WBC: 4.9 10*3/uL (ref 4.0–10.5)
nRBC: 0 % (ref 0.0–0.2)

## 2023-04-01 LAB — SAMPLE TO BLOOD BANK

## 2023-04-01 LAB — PREPARE RBC (CROSSMATCH)

## 2023-04-01 MED ORDER — EPOETIN ALFA-EPBX 40000 UNIT/ML IJ SOLN
40000.0000 [IU] | Freq: Once | INTRAMUSCULAR | Status: AC
  Administered 2023-04-01: 40000 [IU] via SUBCUTANEOUS
  Filled 2023-04-01: qty 1

## 2023-04-01 NOTE — Addendum Note (Signed)
Addended by: Harrel Lemon on: 04/01/2023 02:18 PM   Modules accepted: Orders

## 2023-04-01 NOTE — Progress Notes (Signed)
Patient presents today for Retacrit injection, hemoglobin 7.9. Dose increased to 40,000 units weekly, per Rojelio Brenner, PA, patient will receive 1 unit of blood this week. Patient made aware to leave blood bank bracelet on and Cancer Center will call to make infusion appointment.  VSS tolerated without incident or complaint. See MAR for details. Patient stable during and after injection. Patient discharged in satisfactory condition with no s/s of distress noted.

## 2023-04-01 NOTE — Patient Instructions (Signed)
[facility] at [facility] **VISIT SUMMARY & IMPORTANT INSTRUCTIONS **   You were seen today by Rojelio Brenner PA-C for your anemia.  Since you continue to have worsening anemia without obvious cause, we will schedule you for a BONE MARROW BIOPSY.  This will be done at [facility] in [place]. Continue workup with Dr. Jena Gauss (gastroenterology), including upcoming endoscopy, to look for any bleeding from your GI tract. Continue weekly blood check and injections, with as needed blood transfusions once a week.  FOLLOW-UP APPOINTMENT: Office visit with Rojelio Brenner PA-C 1 week after bone marrow biopsy  ** Thank you for trusting me with your healthcare!  I strive to provide all of my patients with quality care at each visit.  If you receive a survey for this visit, I would be so grateful to you for taking the time to provide feedback.  Thank you in advance!  ~ Graylee Arutyunyan                   Dr. Doreatha Massed   &   Rojelio Brenner, PA-C   - - - - - - - - - - - - - - - - - -    Thank you for choosing [facility] at [facility] to provide your oncology and hematology care.  To afford each patient quality time with our provider, please arrive at least 15 minutes before your scheduled appointment time.   If you have a lab appointment with the Cancer Center please come in thru the Main Entrance and check in at the main information desk.  You need to re-schedule your appointment should you arrive 10 or more minutes late.  We strive to give you quality time with our providers, and arriving late affects you and other patients whose appointments are after yours.  Also, if you no show three or more times for appointments you may be dismissed from the clinic at the providers discretion.     Again, thank you for choosing [facility].  Our hope is that these requests will decrease the amount of time that you wait before being seen by  our physicians.       _____________________________________________________________  Should you have questions after your visit to [facility], please contact our office at [phone] and follow the prompts.  Our office hours are 8:00 a.m. and 4:30 p.m. Monday - Friday.  Please note that voicemails left after 4:00 p.m. may not be returned until the following business day.  We are closed weekends and major holidays.  You do have access to a nurse 24-7, just call the main number to the clinic [phone] and do not press any options, hold on the line and a nurse will answer the phone.    For prescription refill requests, have your pharmacy contact our office and allow 72 hours.

## 2023-04-01 NOTE — Progress Notes (Signed)
BRIEF PROGRESS NOTE (nonbillable): Discussed with patient during his injection visit today regarding his persistent anemia.  Have also discussed in depth with Dr. Ellin Saba (supervising oncologist) and Dr. Jena Gauss (gastroenterologist).  Clinical picture could still indicate occult blood loss (as evidenced by worsening of anemia after he was started on Eliquis, presence of iron deficiency, and presence of elevated reticulocytes).  However, as Dr. Jena Gauss has noted, there is no obvious sign of GI blood loss - patient has been Hemoccult negative on multiple occasions, and EGD/colonoscopies have not shown any bleeding lesions.  Patient is on maximum dose of Retacrit (40,000 units weekly), but with persistent anemia.  Patient is scheduled for repeat EGD with Dr. Jena Gauss in early June 2024. In addition to this, we will schedule patient for bone marrow biopsy.  (Patient is agreeable). Patient will follow-up with me in clinic 1 week after bone marrow biopsy.  Carnella Guadalajara, PA-C  04/01/23 11:59 AM

## 2023-04-01 NOTE — Telephone Encounter (Signed)
Called patient to let him know of his appt for tomorrow at 0830 for his blood transfusion.

## 2023-04-02 ENCOUNTER — Inpatient Hospital Stay: Payer: Medicare Other

## 2023-04-02 DIAGNOSIS — D509 Iron deficiency anemia, unspecified: Secondary | ICD-10-CM | POA: Diagnosis not present

## 2023-04-02 DIAGNOSIS — N1832 Chronic kidney disease, stage 3b: Secondary | ICD-10-CM | POA: Diagnosis not present

## 2023-04-02 DIAGNOSIS — D539 Nutritional anemia, unspecified: Secondary | ICD-10-CM

## 2023-04-02 DIAGNOSIS — D500 Iron deficiency anemia secondary to blood loss (chronic): Secondary | ICD-10-CM

## 2023-04-02 DIAGNOSIS — D631 Anemia in chronic kidney disease: Secondary | ICD-10-CM

## 2023-04-02 LAB — TYPE AND SCREEN: Antibody Screen: POSITIVE

## 2023-04-02 LAB — BPAM RBC
Blood Product Expiration Date: 202406162359
ISSUE DATE / TIME: 202405230839
Unit Type and Rh: 5100

## 2023-04-02 MED ORDER — SODIUM CHLORIDE 0.9% IV SOLUTION
250.0000 mL | Freq: Once | INTRAVENOUS | Status: AC
  Administered 2023-04-02: 250 mL via INTRAVENOUS

## 2023-04-02 NOTE — Progress Notes (Signed)
Patient presents today for 1 unit PRBC blood transfusion infusion.  Patient is in satisfactory condition with no new complaints voiced.  Vital signs are stable.  Patient reports taking per meds of tylenol and benadryl at 8am this morning prior to coming.  We will proceed with infusion per provider orders.     Patient tolerated treatment well with no complaints voiced.  Patient left via wheelchair with wife in stable condition.  Vital signs stable at discharge.  Follow up as scheduled.

## 2023-04-02 NOTE — Patient Instructions (Signed)
[facility]  Discharge Instructions: Thank you for choosing [facility] to provide your oncology and hematology care.  If you have a lab appointment with the Cancer Center - please note that after April 8th, 2024, all labs will be drawn in the cancer center.  You do not have to check in or register with the main entrance as you have in the past but will complete your check-in in the cancer center.  Wear comfortable clothing and clothing appropriate for easy access to any Portacath or PICC line.   We strive to give you quality time with your provider. You may need to reschedule your appointment if you arrive late (15 or more minutes).  Arriving late affects you and other patients whose appointments are after yours.  Also, if you miss three or more appointments without notifying the office, you may be dismissed from the clinic at the provider's discretion.      For prescription refill requests, have your pharmacy contact our office and allow 72 hours for refills to be completed.    Today you received the following 1 unit PRBC.     To help prevent nausea and vomiting after your treatment, we encourage you to take your nausea medication as directed.  BELOW ARE SYMPTOMS THAT SHOULD BE REPORTED IMMEDIATELY: *FEVER GREATER THAN 100.4 F (38 C) OR HIGHER *CHILLS OR SWEATING *NAUSEA AND VOMITING THAT IS NOT CONTROLLED WITH YOUR NAUSEA MEDICATION *UNUSUAL SHORTNESS OF BREATH *UNUSUAL BRUISING OR BLEEDING *URINARY PROBLEMS (pain or burning when urinating, or frequent urination) *BOWEL PROBLEMS (unusual diarrhea, constipation, pain near the anus) TENDERNESS IN MOUTH AND THROAT WITH OR WITHOUT PRESENCE OF ULCERS (sore throat, sores in mouth, or a toothache) UNUSUAL RASH, SWELLING OR PAIN  UNUSUAL VAGINAL DISCHARGE OR ITCHING   Items with * indicate a potential emergency and should be followed up as soon as possible or go to the Emergency Department if any problems  should occur.  Please show the CHEMOTHERAPY ALERT CARD or IMMUNOTHERAPY ALERT CARD at check-in to the Emergency Department and triage nurse.  Should you have questions after your visit or need to cancel or reschedule your appointment, please contact [facility] AT [facility] [phone]  and follow the prompts.  Office hours are 8:00 a.m. to 4:30 p.m. Monday - Friday. Please note that voicemails left after 4:00 p.m. may not be returned until the following business day.  We are closed weekends and major holidays. You have access to a nurse at all times for urgent questions. Please call the main number to the clinic [phone] and follow the prompts.  For any non-urgent questions, you may also contact your provider using MyChart. We now offer e-Visits for anyone 6 and older to request care online for non-urgent symptoms. For details visit [email or web address].   Also download the MyChart app! Go to the app store, search "MyChart", open the app, select [place], and log in with your MyChart username and password.

## 2023-04-03 LAB — TYPE AND SCREEN
ABO/RH(D): O POS
Unit division: 0

## 2023-04-03 LAB — BPAM RBC

## 2023-04-07 ENCOUNTER — Ambulatory Visit: Payer: Medicare Other | Admitting: Family Medicine

## 2023-04-08 ENCOUNTER — Inpatient Hospital Stay: Payer: Medicare Other

## 2023-04-08 VITALS — BP 125/68 | HR 77 | Temp 98.2°F | Resp 20

## 2023-04-08 DIAGNOSIS — D509 Iron deficiency anemia, unspecified: Secondary | ICD-10-CM | POA: Diagnosis not present

## 2023-04-08 DIAGNOSIS — D631 Anemia in chronic kidney disease: Secondary | ICD-10-CM

## 2023-04-08 DIAGNOSIS — N1832 Chronic kidney disease, stage 3b: Secondary | ICD-10-CM | POA: Diagnosis not present

## 2023-04-08 DIAGNOSIS — D500 Iron deficiency anemia secondary to blood loss (chronic): Secondary | ICD-10-CM

## 2023-04-08 DIAGNOSIS — D539 Nutritional anemia, unspecified: Secondary | ICD-10-CM

## 2023-04-08 LAB — CBC
HCT: 30.7 % — ABNORMAL LOW (ref 39.0–52.0)
Hemoglobin: 9.4 g/dL — ABNORMAL LOW (ref 13.0–17.0)
MCH: 31.1 pg (ref 26.0–34.0)
MCHC: 30.6 g/dL (ref 30.0–36.0)
MCV: 101.7 fL — ABNORMAL HIGH (ref 80.0–100.0)
Platelets: 148 10*3/uL — ABNORMAL LOW (ref 150–400)
RBC: 3.02 MIL/uL — ABNORMAL LOW (ref 4.22–5.81)
RDW: 19 % — ABNORMAL HIGH (ref 11.5–15.5)
WBC: 6.5 10*3/uL (ref 4.0–10.5)
nRBC: 0 % (ref 0.0–0.2)

## 2023-04-08 LAB — SAMPLE TO BLOOD BANK

## 2023-04-08 MED ORDER — EPOETIN ALFA-EPBX 40000 UNIT/ML IJ SOLN
40000.0000 [IU] | Freq: Once | INTRAMUSCULAR | Status: AC
  Administered 2023-04-08: 40000 [IU] via SUBCUTANEOUS
  Filled 2023-04-08: qty 1

## 2023-04-08 NOTE — Progress Notes (Signed)
Patient presents today for Retacrit injection. Hemoglobin reviewed prior to administration. VSS tolerated without incident or complaint. See MAR for details. Patient stable during and after injection. Patient discharged in satisfactory condition with no s/s of distress noted.  

## 2023-04-08 NOTE — Patient Instructions (Signed)
[facility]  Discharge Instructions: Thank you for choosing [facility] to provide your oncology and hematology care.  If you have a lab appointment with the Cancer Center - please note that after April 8th, 2024, all labs will be drawn in the cancer center.  You do not have to check in or register with the main entrance as you have in the past but will complete your check-in in the cancer center.  Wear comfortable clothing and clothing appropriate for easy access to any Portacath or PICC line.   We strive to give you quality time with your provider. You may need to reschedule your appointment if you arrive late (15 or more minutes).  Arriving late affects you and other patients whose appointments are after yours.  Also, if you miss three or more appointments without notifying the office, you may be dismissed from the clinic at the provider's discretion.      For prescription refill requests, have your pharmacy contact our office and allow 72 hours for refills to be completed.    Today you received the following Retacrit, return as scheduled.   To help prevent nausea and vomiting after your treatment, we encourage you to take your nausea medication as directed.  BELOW ARE SYMPTOMS THAT SHOULD BE REPORTED IMMEDIATELY: *FEVER GREATER THAN 100.4 F (38 C) OR HIGHER *CHILLS OR SWEATING *NAUSEA AND VOMITING THAT IS NOT CONTROLLED WITH YOUR NAUSEA MEDICATION *UNUSUAL SHORTNESS OF BREATH *UNUSUAL BRUISING OR BLEEDING *URINARY PROBLEMS (pain or burning when urinating, or frequent urination) *BOWEL PROBLEMS (unusual diarrhea, constipation, pain near the anus) TENDERNESS IN MOUTH AND THROAT WITH OR WITHOUT PRESENCE OF ULCERS (sore throat, sores in mouth, or a toothache) UNUSUAL RASH, SWELLING OR PAIN  UNUSUAL VAGINAL DISCHARGE OR ITCHING   Items with * indicate a potential emergency and should be followed up as soon as possible or go to the Emergency Department if  any problems should occur.  Please show the CHEMOTHERAPY ALERT CARD or IMMUNOTHERAPY ALERT CARD at check-in to the Emergency Department and triage nurse.  Should you have questions after your visit or need to cancel or reschedule your appointment, please contact [facility] [place] [phone]  and follow the prompts.  Office hours are 8:00 a.m. to 4:30 p.m. Monday - Friday. Please note that voicemails left after 4:00 p.m. may not be returned until the following business day.  We are closed weekends and major holidays. You have access to a nurse at all times for urgent questions. Please call the main number to the clinic [phone] and follow the prompts.  For any non-urgent questions, you may also contact your provider using MyChart. We now offer e-Visits for anyone 18 and older to request care online for non-urgent symptoms. For details visit [email or web address].   Also download the MyChart app! Go to the app store, search "MyChart", open the app, select [place], and log in with your MyChart username and password.   

## 2023-04-09 ENCOUNTER — Ambulatory Visit (INDEPENDENT_AMBULATORY_CARE_PROVIDER_SITE_OTHER): Payer: No Typology Code available for payment source | Admitting: Orthopaedic Surgery

## 2023-04-09 ENCOUNTER — Encounter: Payer: Self-pay | Admitting: Orthopaedic Surgery

## 2023-04-09 VITALS — Ht 72.0 in | Wt 225.0 lb

## 2023-04-09 DIAGNOSIS — Z7901 Long term (current) use of anticoagulants: Secondary | ICD-10-CM | POA: Diagnosis not present

## 2023-04-09 DIAGNOSIS — I480 Paroxysmal atrial fibrillation: Secondary | ICD-10-CM

## 2023-04-09 DIAGNOSIS — M1711 Unilateral primary osteoarthritis, right knee: Secondary | ICD-10-CM

## 2023-04-09 DIAGNOSIS — D500 Iron deficiency anemia secondary to blood loss (chronic): Secondary | ICD-10-CM | POA: Diagnosis not present

## 2023-04-09 NOTE — Patient Instructions (Signed)
David Duarte  04/09/2023     @PREFPERIOPPHARMACY @   Your procedure is scheduled on 04/15/2023.  Report to [facility] at 12:30 PM  Call this number if you have problems the morning of surgery:  [phone]  If you experience any cold or flu symptoms such as cough, fever, chills, shortness of breath, etc. between now and your scheduled surgery, please notify us at the above number.   Remember: Please follow the diet instructions given to you by Dr Luvenia Starch office.      Take these medicines the morning of surgery with A SIP OF WATER : Aricept Lexapro and Prilosec    Do not wear jewelry, make-up or nail polish, including gel polish,  artificial nails, or any other type of covering on natural nails (fingers and  toes).  Do not wear lotions, powders, or perfumes, or deodorant.  Do not shave 48 hours prior to surgery.  Men may shave face and neck.  Do not bring valuables to the hospital.  [facility] is not responsible for any belongings or valuables.  Contacts, dentures or bridgework may not be worn into surgery.  Leave your suitcase in the car.  After surgery it may be brought to your room.  For patients admitted to the hospital, discharge time will be determined by your treatment team.  Patients discharged the day of surgery will not be allowed to drive home.   Name and phone number of your driver:   Family Special instructions:  N/a  Please read over the following fact sheets that you were given. Care and Recovery After Surgery  Upper Endoscopy, Adult Upper endoscopy is a procedure to look inside the upper GI (gastrointestinal) tract. The upper GI tract is made up of: The esophagus. This is the part of the body that moves food from your mouth to your stomach. The stomach. The duodenum. This is the first part of your small intestine. This procedure is also called esophagogastroduodenoscopy (EGD) or gastroscopy. In this procedure, your health care provider passes a thin,  flexible tube (endoscope) through your mouth and down your esophagus into your stomach and into your duodenum. A small camera is attached to the end of the tube. Images from the camera appear on a monitor in the exam room. During this procedure, your health care provider may also remove a small piece of tissue to be sent to a lab and examined under a microscope (biopsy). Your health care provider may do an upper endoscopy to diagnose cancers of the upper GI tract. You may also have this procedure to find the cause of other conditions, such as: Stomach pain. Heartburn. Pain or problems when swallowing. Nausea and vomiting. Stomach bleeding. Stomach ulcers. Tell a health care provider about: Any allergies you have. All medicines you are taking, including vitamins, herbs, eye drops, creams, and over-the-counter medicines. Any problems you or family members have had with anesthetic medicines. Any bleeding problems you have. Any surgeries you have had. Any medical conditions you have. Whether you are pregnant or may be pregnant. What are the risks? Your healthcare provider will talk with you about risks. These may include: Infection. Bleeding. Allergic reactions to medicines. A tear or hole (perforation) in the esophagus, stomach, or duodenum. What happens before the procedure? When to stop eating and drinking Follow instructions from your health care provider about what you may eat and drink. These may include: 8 hours before your procedure Stop eating most foods. Do not eat meat, fried foods,  or fatty foods. Eat only light foods, such as toast or crackers. All liquids are okay except energy drinks and alcohol. 6 hours before your procedure Stop eating. Drink only clear liquids, such as water, clear fruit juice, black coffee, plain tea, and sports drinks. Do not drink energy drinks or alcohol. 2 hours before your procedure Stop drinking all liquids. You may be allowed to take medicines  with small sips of water. If you do not follow your health care provider's instructions, your procedure may be delayed or canceled. Medicines Ask your health care provider about: Changing or stopping your regular medicines. This is especially important if you are taking diabetes medicines or blood thinners. Taking medicines such as aspirin and ibuprofen. These medicines can thin your blood. Do not take these medicines unless your health care provider tells you to take them. Taking over-the-counter medicines, vitamins, herbs, and supplements. General instructions If you will be going home right after the procedure, plan to have a responsible adult: Take you home from the hospital or clinic. You will not be allowed to drive. Care for you for the time you are told. What happens during the procedure?  An IV will be inserted into one of your veins. You may be given one or more of the following: A medicine to help you relax (sedative). A medicine to numb the throat (local anesthetic). You will lie on your left side on an exam table. Your health care provider will pass the endoscope through your mouth and down your esophagus. Your health care provider will use the scope to check the inside of your esophagus, stomach, and duodenum. Biopsies may be taken. The endoscope will be removed. The procedure may vary among health care providers and hospitals. What happens after the procedure? Your blood pressure, heart rate, breathing rate, and blood oxygen level will be monitored until you leave the hospital or clinic. When your throat is no longer numb, you may be given some fluids to drink. If you were given a sedative during the procedure, it can affect you for several hours. Do not drive or operate machinery until your health care provider says that it is safe. It is up to you to get the results of your procedure. Ask your health care provider, or the department that is doing the procedure, when your  results will be ready. Contact a health care provider if you: Have a sore throat that lasts longer than 1 day. Have a fever. Get help right away if you: Vomit blood or your vomit looks like coffee grounds. Have bloody, black, or tarry stools. Have a very bad sore throat or you cannot swallow. Have difficulty breathing or very bad pain in your chest or abdomen. These symptoms may be an emergency. Get help right away. Call 911. Do not wait to see if the symptoms will go away. Do not drive yourself to the hospital. Summary Upper endoscopy is a procedure to look inside the upper GI tract. During the procedure, an IV will be inserted into one of your veins. You may be given a medicine to help you relax. The endoscope will be passed through your mouth and down your esophagus. Follow instructions from your health care provider about what you can eat and drink. This information is not intended to replace advice given to you by your health care provider. Make sure you discuss any questions you have with your health care provider. Document Revised: 02/05/2022 Document Reviewed: 02/05/2022 Elsevier Patient Education  2024 [employer].  Monitored Anesthesia Care Anesthesia refers to the techniques, procedures, and medicines that help a person stay safe and comfortable during surgery. Monitored anesthesia care, or sedation, is one type of anesthesia. You may have sedation if you do not need to be asleep for your procedure. Procedures that use sedation may include: Surgery to remove cataracts from your eyes. A dental procedure. A biopsy. This is when a tissue sample is removed and looked at under a microscope. You will be watched closely during your procedure. Your level of sedation or type of anesthesia may be changed to fit your needs. Tell a health care provider about: Any allergies you have. All medicines you are taking, including vitamins, herbs, eye drops, creams, and over-the-counter  medicines. Any problems you or family members have had with anesthesia. Any bleeding problems you have. Any surgeries you have had. Any medical conditions or illnesses you have. This includes sleep apnea, cough, fever, or the flu. Whether you are pregnant or may be pregnant. Whether you use cigarettes, alcohol, or drugs. Any use of steroids, whether by mouth or as a cream. What are the risks? Your health care provider will talk with you about risks. These may include: Getting too much medicine (oversedation). Nausea. Allergic reactions to medicines. Trouble breathing. If this happens, a breathing tube may be used to help you breathe. It will be removed when you are awake and breathing on your own. Heart trouble. Lung trouble. Confusion that gets better with time (emergence delirium). What happens before the procedure? When to stop eating and drinking Follow instructions from your health care provider about what you may eat and drink. These may include: 8 hours before your procedure Stop eating most foods. Do not eat meat, fried foods, or fatty foods. Eat only light foods, such as toast or crackers. All liquids are okay except energy drinks and alcohol. 6 hours before your procedure Stop eating. Drink only clear liquids, such as water, clear fruit juice, black coffee, plain tea, and sports drinks. Do not drink energy drinks or alcohol. 2 hours before your procedure Stop drinking all liquids. You may be allowed to take medicines with small sips of water. If you do not follow your health care provider's instructions, your procedure may be delayed or canceled. Medicines Ask your health care provider about: Changing or stopping your regular medicines. These include any diabetes medicines or blood thinners you take. Taking medicines such as aspirin and ibuprofen. These medicines can thin your blood. Do not take them unless your health care provider tells you to. Taking over-the-counter  medicines, vitamins, herbs, and supplements. Testing You may have an exam or testing. You may have a blood or urine sample taken. General instructions Do not use any products that contain nicotine or tobacco for at least 4 weeks before the procedure. These products include cigarettes, chewing tobacco, and vaping devices, such as e-cigarettes. If you need help quitting, ask your health care provider. If you will be going home right after the procedure, plan to have a responsible adult: Take you home from the hospital or clinic. You will not be allowed to drive. Care for you for the time you are told. What happens during the procedure?  Your blood pressure, heart rate, breathing, level of pain, and blood oxygen level will be monitored. An IV will be inserted into one of your veins. You may be given: A sedative. This helps you relax. Anesthesia. This will: Numb certain areas of your body. Make you fall asleep for surgery.  You will be given medicines as needed to keep you comfortable. The more medicine you are given, the deeper your level of sedation will be. Your level of sedation may be changed to fit your needs. There are three levels of sedation: Mild sedation. At this level, you may feel awake and relaxed. You will be able to follow directions. Moderate sedation. At this level, you will be sleepy. You may not remember the procedure. Deep sedation. At this level, you will be asleep. You will not remember the procedure. How you get the medicines will depend on your age and the procedure. They may be given as: A pill. This may be taken by mouth (orally) or inserted into the rectum. An injection. This may be into a vein or muscle. A spray through the nose. After your procedure is over, the medicine will be stopped. The procedure may vary among health care providers and hospitals. What happens after the procedure? Your blood pressure, heart rate, breathing rate, and blood oxygen level will  be monitored until you leave the hospital or clinic. You may feel sleepy, clumsy, or nauseous. You may not remember what happened during or after the procedure. Sedation can affect you for several hours. Do not drive or use machinery until your health care provider says that it is safe. This information is not intended to replace advice given to you by your health care provider. Make sure you discuss any questions you have with your health care provider. Document Revised: 03/24/2022 Document Reviewed: 03/24/2022 Elsevier Patient Education  2024 [employer].
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
## 2023-04-12 ENCOUNTER — Other Ambulatory Visit: Payer: Self-pay | Admitting: Radiology

## 2023-04-12 DIAGNOSIS — D464 Refractory anemia, unspecified: Secondary | ICD-10-CM

## 2023-04-13 ENCOUNTER — Encounter (HOSPITAL_COMMUNITY): Payer: Self-pay

## 2023-04-13 ENCOUNTER — Encounter (HOSPITAL_COMMUNITY)
Admission: RE | Admit: 2023-04-13 | Discharge: 2023-04-13 | Disposition: A | Discharge status: Home / Self Care | Payer: Medicare Other | Source: Ambulatory Visit | Attending: Internal Medicine | Admitting: Internal Medicine

## 2023-04-13 VITALS — BP 125/68 | HR 77 | Temp 98.2°F | Resp 18 | Ht 72.0 in | Wt 225.0 lb

## 2023-04-13 DIAGNOSIS — Z01818 Encounter for other preprocedural examination: Secondary | ICD-10-CM | POA: Diagnosis present

## 2023-04-13 DIAGNOSIS — I10 Essential (primary) hypertension: Secondary | ICD-10-CM

## 2023-04-13 DIAGNOSIS — N1832 Chronic kidney disease, stage 3b: Secondary | ICD-10-CM | POA: Diagnosis not present

## 2023-04-13 DIAGNOSIS — D631 Anemia in chronic kidney disease: Secondary | ICD-10-CM | POA: Diagnosis not present

## 2023-04-13 DIAGNOSIS — Z794 Long term (current) use of insulin: Secondary | ICD-10-CM | POA: Insufficient documentation

## 2023-04-13 DIAGNOSIS — I129 Hypertensive chronic kidney disease with stage 1 through stage 4 chronic kidney disease, or unspecified chronic kidney disease: Secondary | ICD-10-CM | POA: Diagnosis not present

## 2023-04-13 DIAGNOSIS — I4891 Unspecified atrial fibrillation: Secondary | ICD-10-CM | POA: Insufficient documentation

## 2023-04-13 DIAGNOSIS — E1122 Type 2 diabetes mellitus with diabetic chronic kidney disease: Secondary | ICD-10-CM | POA: Diagnosis not present

## 2023-04-13 DIAGNOSIS — E119 Type 2 diabetes mellitus without complications: Secondary | ICD-10-CM

## 2023-04-13 HISTORY — DX: Cardiac arrhythmia, unspecified: I49.9

## 2023-04-13 LAB — BASIC METABOLIC PANEL
Anion gap: 10 (ref 5–15)
BUN: 18 mg/dL (ref 8–23)
CO2: 23 mmol/L (ref 22–32)
Calcium: 8.6 mg/dL — ABNORMAL LOW (ref 8.9–10.3)
Chloride: 103 mmol/L (ref 98–111)
Creatinine, Ser: 1.45 mg/dL — ABNORMAL HIGH (ref 0.61–1.24)
GFR, Estimated: 49 mL/min — ABNORMAL LOW (ref >60)
Glucose, Bld: 119 mg/dL — ABNORMAL HIGH (ref 70–99)
Potassium: 4.1 mmol/L (ref 3.5–5.1)
Sodium: 136 mmol/L (ref 135–145)

## 2023-04-13 LAB — CBC WITH DIFFERENTIAL/PLATELET
Abs Immature Granulocytes: 0.33 10*3/uL — ABNORMAL HIGH (ref 0.00–0.07)
Basophils Absolute: 0 10*3/uL (ref 0.0–0.1)
Basophils Relative: 0 %
Eosinophils Absolute: 0.1 10*3/uL (ref 0.0–0.5)
Eosinophils Relative: 1 %
HCT: 27.5 % — ABNORMAL LOW (ref 39.0–52.0)
Hemoglobin: 8.2 g/dL — ABNORMAL LOW (ref 13.0–17.0)
Immature Granulocytes: 5 %
Lymphocytes Relative: 15 %
Lymphs Abs: 1 10*3/uL (ref 0.7–4.0)
MCH: 30.6 pg (ref 26.0–34.0)
MCHC: 29.8 g/dL — ABNORMAL LOW (ref 30.0–36.0)
MCV: 102.6 fL — ABNORMAL HIGH (ref 80.0–100.0)
Monocytes Absolute: 2.1 10*3/uL — ABNORMAL HIGH (ref 0.1–1.0)
Monocytes Relative: 31 %
Neutro Abs: 3.4 10*3/uL (ref 1.7–7.7)
Neutrophils Relative %: 48 %
Platelets: 148 10*3/uL — ABNORMAL LOW (ref 150–400)
RBC: 2.68 MIL/uL — ABNORMAL LOW (ref 4.22–5.81)
RDW: 19 % — ABNORMAL HIGH (ref 11.5–15.5)
WBC: 7 10*3/uL (ref 4.0–10.5)
nRBC: 0 % (ref 0.0–0.2)
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
## 2023-04-14 ENCOUNTER — Encounter (HOSPITAL_COMMUNITY): Payer: Self-pay

## 2023-04-14 ENCOUNTER — Ambulatory Visit (HOSPITAL_COMMUNITY)
Admission: RE | Admit: 2023-04-14 | Discharge: 2023-04-14 | Disposition: A | Discharge status: Home / Self Care | Payer: Medicare Other | Source: Ambulatory Visit | Attending: Physician Assistant | Admitting: Physician Assistant

## 2023-04-14 DIAGNOSIS — D696 Thrombocytopenia, unspecified: Secondary | ICD-10-CM | POA: Diagnosis not present

## 2023-04-14 DIAGNOSIS — D72821 Monocytosis (symptomatic): Secondary | ICD-10-CM | POA: Diagnosis not present

## 2023-04-14 DIAGNOSIS — Z95820 Peripheral vascular angioplasty status with implants and grafts: Secondary | ICD-10-CM | POA: Insufficient documentation

## 2023-04-14 DIAGNOSIS — E785 Hyperlipidemia, unspecified: Secondary | ICD-10-CM | POA: Insufficient documentation

## 2023-04-14 DIAGNOSIS — R701 Abnormal plasma viscosity: Secondary | ICD-10-CM | POA: Diagnosis not present

## 2023-04-14 DIAGNOSIS — I5030 Unspecified diastolic (congestive) heart failure: Secondary | ICD-10-CM | POA: Insufficient documentation

## 2023-04-14 DIAGNOSIS — Z86711 Personal history of pulmonary embolism: Secondary | ICD-10-CM | POA: Diagnosis not present

## 2023-04-14 DIAGNOSIS — N189 Chronic kidney disease, unspecified: Secondary | ICD-10-CM | POA: Diagnosis not present

## 2023-04-14 DIAGNOSIS — D539 Nutritional anemia, unspecified: Secondary | ICD-10-CM | POA: Diagnosis present

## 2023-04-14 DIAGNOSIS — E1122 Type 2 diabetes mellitus with diabetic chronic kidney disease: Secondary | ICD-10-CM | POA: Diagnosis not present

## 2023-04-14 DIAGNOSIS — D464 Refractory anemia, unspecified: Secondary | ICD-10-CM

## 2023-04-14 LAB — CBC WITH DIFFERENTIAL/PLATELET
Abs Immature Granulocytes: 0.34 10*3/uL — ABNORMAL HIGH (ref 0.00–0.07)
Basophils Absolute: 0 10*3/uL (ref 0.0–0.1)
Basophils Relative: 1 %
Eosinophils Absolute: 0 10*3/uL (ref 0.0–0.5)
Eosinophils Relative: 1 %
HCT: 27.3 % — ABNORMAL LOW (ref 39.0–52.0)
Hemoglobin: 8.3 g/dL — ABNORMAL LOW (ref 13.0–17.0)
Immature Granulocytes: 6 %
Lymphocytes Relative: 18 %
Lymphs Abs: 1.1 10*3/uL (ref 0.7–4.0)
MCH: 30.6 pg (ref 26.0–34.0)
MCHC: 30.4 g/dL (ref 30.0–36.0)
MCV: 100.7 fL — ABNORMAL HIGH (ref 80.0–100.0)
Monocytes Absolute: 2 10*3/uL — ABNORMAL HIGH (ref 0.1–1.0)
Monocytes Relative: 33 %
Neutro Abs: 2.6 10*3/uL (ref 1.7–7.7)
Neutrophils Relative %: 41 %
Platelets: 156 10*3/uL (ref 150–400)
RBC: 2.71 MIL/uL — ABNORMAL LOW (ref 4.22–5.81)
RDW: 18.9 % — ABNORMAL HIGH (ref 11.5–15.5)
WBC: 6.1 10*3/uL (ref 4.0–10.5)
nRBC: 0 % (ref 0.0–0.2)

## 2023-04-14 LAB — GLUCOSE, CAPILLARY
Glucose-Capillary: 65 mg/dL — ABNORMAL LOW (ref 70–99)
Glucose-Capillary: 114 mg/dL — ABNORMAL HIGH (ref 70–99)

## 2023-04-14 MED ORDER — MIDAZOLAM HCL 2 MG/2ML IJ SOLN
INTRAMUSCULAR | Status: AC | PRN
  Administered 2023-04-14 (×2): 1 mg via INTRAVENOUS

## 2023-04-14 MED ORDER — FLUMAZENIL 0.5 MG/5ML IV SOLN
INTRAVENOUS | Status: AC
  Filled 2023-04-14: qty 5

## 2023-04-14 MED ORDER — FENTANYL CITRATE (PF) 100 MCG/2ML IJ SOLN
INTRAMUSCULAR | Status: AC
  Filled 2023-04-14: qty 4

## 2023-04-14 MED ORDER — MIDAZOLAM HCL 2 MG/2ML IJ SOLN
INTRAMUSCULAR | Status: AC
  Filled 2023-04-14: qty 4

## 2023-04-14 MED ORDER — FENTANYL CITRATE (PF) 100 MCG/2ML IJ SOLN
INTRAMUSCULAR | Status: AC | PRN
  Administered 2023-04-14 (×2): 50 ug via INTRAVENOUS

## 2023-04-14 MED ORDER — DEXTROSE 50 % IV SOLN
12.5000 g | Freq: Once | INTRAVENOUS | Status: AC
  Administered 2023-04-14: 12.5 g via INTRAVENOUS

## 2023-04-14 MED ORDER — SODIUM CHLORIDE 0.9 % IV SOLN: INTRAVENOUS | Status: DC

## 2023-04-14 MED ORDER — NALOXONE HCL 0.4 MG/ML IJ SOLN
INTRAMUSCULAR | Status: AC
  Filled 2023-04-14: qty 1

## 2023-04-14 NOTE — Sedation Documentation (Signed)
Sample #1 obtained 

## 2023-04-14 NOTE — Sedation Documentation (Signed)
Sample #2 obtained 

## 2023-04-14 NOTE — Procedures (Signed)
Vascular and Interventional Radiology Procedure Note  Patient: David Duarte DOB: 1940/11/12 Medical Record Number: [id number] Note Date/Time: 04/14/23 9:45 AM   Performing Physician: Roanna Banning, MD Assistant(s): None  Diagnosis: Anemia  Procedure: BONE MARROW ASPIRATION and BIOPSY  Anesthesia: Conscious Sedation Complications: None Estimated Blood Loss: Minimal Specimens: Sent for Pathology  Findings:  Successful CT-guided bone marrow aspiration and biopsy A total of 1 cores were obtained. Hemostasis of the tract was achieved using Manual Pressure.  Plan: Bed rest for 1 hours.  See detailed procedure note with images in PACS. The patient tolerated the procedure well without incident or complication and was returned to Recovery in stable condition.    Roanna Banning, MD [facility] Radiology   Pager. [phone] Clinic. [phone]

## 2023-04-14 NOTE — Pre-Procedure Instructions (Signed)
Dr Jena Gauss aware of labs. No new orders given.

## 2023-04-14 NOTE — Discharge Instructions (Addendum)
Bone Marrow Aspiration and Bone Marrow Biopsy, Adult, Care After  The following information offers guidance on how to care for yourself after your procedure. Your health care provider may also give you more specific instructions. If you have problems or questions, contact your health care provider.  What can I expect after the procedure?  May remove dressing or bandaid and shower tomorrow.  Keep site clean and dry. Replace with clean dressing or bandaid as necessary. Urgent needs IR clinic [phone] (mon-fri 8-5).  After the procedure, it is common to have: Mild pain and tenderness. Swelling. Bruising. Follow these instructions at home: Incision care  Follow instructions from your health care provider about how to take care of the incision site. Make sure you: Wash your hands with soap and water for at least 20 seconds before and after you change your bandage (dressing). If soap and water are not available, use hand sanitizer. Change your dressing as told by your health care provider. Leave stitches (sutures), skin glue, or adhesive strips in place. These skin closures may need to stay in place for 2 weeks or longer. If adhesive strip edges start to loosen and curl up, you may trim the loose edges. Do not remove adhesive strips completely unless your health care provider tells you to do that. Check your incision site every day for signs of infection. Check for: More redness, swelling, or pain. Fluid or blood. Warmth. Pus or a bad smell. Activity Return to your normal activities as told by your health care provider. Ask your health care provider what activities are safe for you. Do not lift anything that is heavier than 10 lb (4.5 kg), or the limit that you are told, until your health care provider says that it is safe. If you were given a sedative during the procedure, it can affect you for  several hours. Do not drive or operate machinery until your health care provider says that it is safe. General instructions  Take over-the-counter and prescription medicines only as told by your health care provider. Do not take baths, swim, or use a hot tub until your health care provider approves. Ask your health care provider if you may take showers. You may only be allowed to take sponge baths. If directed, put ice on the affected area. To do this: Put ice in a plastic bag. Place a towel between your skin and the bag. Leave the ice on for 20 minutes, 2-3 times a day. If your skin turns bright red, remove the ice right away to prevent skin damage. The risk of skin damage is higher if you cannot feel pain, heat, or cold. Contact a health care provider if: You have signs of infection. Your pain is not controlled with medicine. You have cancer, and a temperature of 100.4F (38C) or higher. Get help right away if: You have a temperature of 101F (38.3C) or higher, or as told by your health care provider. You have bleeding from the incision site that cannot be controlled. This information is not intended to replace advice given to you by your health care provider. Make sure you discuss any questions you have with your health care provider. Document Revised: 03/03/2022 Document Reviewed: 03/03/2022 Elsevier Patient Education  2023 Elsevier Inc.                            Moderate Conscious Sedation, Adult, Care After  This sheet gives you information about how to care   for yourself after your procedure. Your health care provider may also give you more specific instructions. If you have problems or questions, contact your health care provider. What can I expect after the procedure? After the procedure, it is common to have: Sleepiness for several hours. Impaired judgment for several hours. Difficulty with balance. Vomiting if you eat too soon. Follow these instructions at home: For  the time period you were told by your health care provider:     Rest. Do not participate in activities where you could fall or become injured. Do not drive or use machinery. Do not drink alcohol. Do not take sleeping pills or medicines that cause drowsiness. Do not make important decisions or sign legal documents. Do not take care of children on your own. Eating and drinking  Follow the diet recommended by your health care provider. Drink enough fluid to keep your urine pale yellow. If you vomit: Drink water, juice, or soup when you can drink without vomiting. Make sure you have little or no nausea before eating solid foods. General instructions Take over-the-counter and prescription medicines only as told by your health care provider. Have a responsible adult stay with you for the time you are told. It is important to have someone help care for you until you are awake and alert. Do not smoke. Keep all follow-up visits as told by your health care provider. This is important. Contact a health care provider if: You are still sleepy or having trouble with balance after 24 hours. You feel light-headed. You keep feeling nauseous or you keep vomiting. You develop a rash. You have a fever. You have redness or swelling around the IV site. Get help right away if: You have trouble breathing. You have new-onset confusion at home. Summary After the procedure, it is common to feel sleepy, have impaired judgment, or feel nauseous if you eat too soon. Rest after you get home. Know the things you should not do after the procedure. Follow the diet recommended by your health care provider and drink enough fluid to keep your urine pale yellow. Get help right away if you have trouble breathing or new-onset confusion at home. This information is not intended to replace advice given to you by your health care provider. Make sure you discuss any questions you have with your health care  provider. Document Revised: 02/24/2020 Document Reviewed: 09/22/2019 Elsevier Patient Education  2023 Elsevier Inc.  

## 2023-04-15 ENCOUNTER — Encounter (HOSPITAL_COMMUNITY)
Admission: RE | Disposition: A | Discharge status: Home / Self Care | Payer: Self-pay | Source: Home / Self Care | Attending: Internal Medicine

## 2023-04-15 ENCOUNTER — Inpatient Hospital Stay: Payer: Medicare Other | Attending: Hematology

## 2023-04-15 ENCOUNTER — Ambulatory Visit (HOSPITAL_COMMUNITY): Payer: Medicare Other | Admitting: Anesthesiology

## 2023-04-15 ENCOUNTER — Encounter (HOSPITAL_COMMUNITY): Payer: Self-pay | Admitting: Internal Medicine

## 2023-04-15 ENCOUNTER — Other Ambulatory Visit: Payer: Self-pay

## 2023-04-15 ENCOUNTER — Inpatient Hospital Stay: Payer: Medicare Other

## 2023-04-15 ENCOUNTER — Ambulatory Visit (HOSPITAL_COMMUNITY)
Admission: RE | Admit: 2023-04-15 | Discharge: 2023-04-15 | Disposition: A | Discharge status: Home / Self Care | Payer: Medicare Other | Attending: Internal Medicine | Admitting: Internal Medicine

## 2023-04-15 ENCOUNTER — Ambulatory Visit (HOSPITAL_BASED_OUTPATIENT_CLINIC_OR_DEPARTMENT_OTHER): Payer: Medicare Other | Admitting: Anesthesiology

## 2023-04-15 VITALS — BP 119/73 | HR 63 | Temp 98.4°F | Resp 20

## 2023-04-15 DIAGNOSIS — I4821 Permanent atrial fibrillation: Secondary | ICD-10-CM | POA: Insufficient documentation

## 2023-04-15 DIAGNOSIS — K766 Portal hypertension: Secondary | ICD-10-CM | POA: Diagnosis not present

## 2023-04-15 DIAGNOSIS — Z6830 Body mass index (BMI) 30.0-30.9, adult: Secondary | ICD-10-CM | POA: Insufficient documentation

## 2023-04-15 DIAGNOSIS — D631 Anemia in chronic kidney disease: Secondary | ICD-10-CM

## 2023-04-15 DIAGNOSIS — I10 Essential (primary) hypertension: Secondary | ICD-10-CM | POA: Diagnosis not present

## 2023-04-15 DIAGNOSIS — I129 Hypertensive chronic kidney disease with stage 1 through stage 4 chronic kidney disease, or unspecified chronic kidney disease: Secondary | ICD-10-CM | POA: Diagnosis not present

## 2023-04-15 DIAGNOSIS — K219 Gastro-esophageal reflux disease without esophagitis: Secondary | ICD-10-CM | POA: Diagnosis not present

## 2023-04-15 DIAGNOSIS — I429 Cardiomyopathy, unspecified: Secondary | ICD-10-CM | POA: Diagnosis not present

## 2023-04-15 DIAGNOSIS — R112 Nausea with vomiting, unspecified: Secondary | ICD-10-CM | POA: Diagnosis not present

## 2023-04-15 DIAGNOSIS — D759 Disease of blood and blood-forming organs, unspecified: Secondary | ICD-10-CM | POA: Diagnosis not present

## 2023-04-15 DIAGNOSIS — D500 Iron deficiency anemia secondary to blood loss (chronic): Secondary | ICD-10-CM

## 2023-04-15 DIAGNOSIS — N1832 Chronic kidney disease, stage 3b: Secondary | ICD-10-CM | POA: Insufficient documentation

## 2023-04-15 DIAGNOSIS — Z95828 Presence of other vascular implants and grafts: Secondary | ICD-10-CM | POA: Diagnosis not present

## 2023-04-15 DIAGNOSIS — Z87891 Personal history of nicotine dependence: Secondary | ICD-10-CM | POA: Diagnosis not present

## 2023-04-15 DIAGNOSIS — D509 Iron deficiency anemia, unspecified: Secondary | ICD-10-CM | POA: Insufficient documentation

## 2023-04-15 DIAGNOSIS — K449 Diaphragmatic hernia without obstruction or gangrene: Secondary | ICD-10-CM | POA: Diagnosis not present

## 2023-04-15 DIAGNOSIS — K3189 Other diseases of stomach and duodenum: Secondary | ICD-10-CM | POA: Diagnosis not present

## 2023-04-15 DIAGNOSIS — I2510 Atherosclerotic heart disease of native coronary artery without angina pectoris: Secondary | ICD-10-CM

## 2023-04-15 DIAGNOSIS — R1310 Dysphagia, unspecified: Secondary | ICD-10-CM | POA: Insufficient documentation

## 2023-04-15 DIAGNOSIS — D539 Nutritional anemia, unspecified: Secondary | ICD-10-CM

## 2023-04-15 DIAGNOSIS — R634 Abnormal weight loss: Secondary | ICD-10-CM | POA: Diagnosis not present

## 2023-04-15 DIAGNOSIS — Z7901 Long term (current) use of anticoagulants: Secondary | ICD-10-CM | POA: Diagnosis not present

## 2023-04-15 DIAGNOSIS — R627 Adult failure to thrive: Secondary | ICD-10-CM | POA: Diagnosis not present

## 2023-04-15 DIAGNOSIS — I714 Abdominal aortic aneurysm, without rupture, unspecified: Secondary | ICD-10-CM | POA: Insufficient documentation

## 2023-04-15 DIAGNOSIS — F419 Anxiety disorder, unspecified: Secondary | ICD-10-CM | POA: Diagnosis not present

## 2023-04-15 DIAGNOSIS — K7460 Unspecified cirrhosis of liver: Secondary | ICD-10-CM | POA: Diagnosis not present

## 2023-04-15 DIAGNOSIS — E1122 Type 2 diabetes mellitus with diabetic chronic kidney disease: Secondary | ICD-10-CM | POA: Diagnosis not present

## 2023-04-15 HISTORY — PX: MALONEY DILATION: SHX5535

## 2023-04-15 HISTORY — PX: ESOPHAGOGASTRODUODENOSCOPY (EGD) WITH PROPOFOL: SHX5813

## 2023-04-15 LAB — CBC WITH DIFFERENTIAL/PLATELET
Abs Immature Granulocytes: 0.3 10*3/uL — ABNORMAL HIGH (ref 0.00–0.07)
Band Neutrophils: 1 %
Basophils Absolute: 0 10*3/uL (ref 0.0–0.1)
Basophils Relative: 0 %
Eosinophils Absolute: 0 10*3/uL (ref 0.0–0.5)
Eosinophils Relative: 0 %
HCT: 27.1 % — ABNORMAL LOW (ref 39.0–52.0)
Hemoglobin: 8.3 g/dL — ABNORMAL LOW (ref 13.0–17.0)
Lymphocytes Relative: 12 %
Lymphs Abs: 0.6 10*3/uL — ABNORMAL LOW (ref 0.7–4.0)
MCH: 30.5 pg (ref 26.0–34.0)
MCHC: 30.6 g/dL (ref 30.0–36.0)
MCV: 99.6 fL (ref 80.0–100.0)
Metamyelocytes Relative: 5 %
Monocytes Absolute: 1.5 10*3/uL — ABNORMAL HIGH (ref 0.1–1.0)
Monocytes Relative: 29 %
Neutro Abs: 2.7 10*3/uL (ref 1.7–7.7)
Neutrophils Relative %: 53 %
Platelets: 157 10*3/uL (ref 150–400)
RBC: 2.72 MIL/uL — ABNORMAL LOW (ref 4.22–5.81)
RDW: 18.8 % — ABNORMAL HIGH (ref 11.5–15.5)
WBC: 5 10*3/uL (ref 4.0–10.5)
nRBC: 0 % (ref 0.0–0.2)

## 2023-04-15 LAB — COMPREHENSIVE METABOLIC PANEL
ALT: 6 U/L (ref 0–44)
AST: 10 U/L — ABNORMAL LOW (ref 15–41)
Albumin: 3.6 g/dL (ref 3.5–5.0)
Alkaline Phosphatase: 39 U/L (ref 38–126)
Anion gap: 10 (ref 5–15)
BUN: 16 mg/dL (ref 8–23)
CO2: 23 mmol/L (ref 22–32)
Calcium: 8.6 mg/dL — ABNORMAL LOW (ref 8.9–10.3)
Chloride: 102 mmol/L (ref 98–111)
Creatinine, Ser: 0.94 mg/dL (ref 0.61–1.24)
GFR, Estimated: >60 mL/min (ref >60)
Glucose, Bld: 116 mg/dL — ABNORMAL HIGH (ref 70–99)
Potassium: 4 mmol/L (ref 3.5–5.1)
Sodium: 135 mmol/L (ref 135–145)
Total Bilirubin: 0.9 mg/dL (ref 0.3–1.2)
Total Protein: 7.6 g/dL (ref 6.5–8.1)

## 2023-04-15 LAB — LACTATE DEHYDROGENASE: LDH: 165 U/L (ref 98–192)

## 2023-04-15 LAB — IRON AND TIBC
Iron: 61 ug/dL (ref 45–182)
Saturation Ratios: 14 % — ABNORMAL LOW (ref 17.9–39.5)
TIBC: 429 ug/dL (ref 250–450)
UIBC: 368 ug/dL

## 2023-04-15 LAB — GLUCOSE, CAPILLARY: Glucose-Capillary: 90 mg/dL (ref 70–99)

## 2023-04-15 LAB — SAMPLE TO BLOOD BANK

## 2023-04-15 LAB — FERRITIN: Ferritin: 323 ng/mL (ref 24–336)

## 2023-04-15 SURGERY — ESOPHAGOGASTRODUODENOSCOPY (EGD) WITH PROPOFOL
Anesthesia: General

## 2023-04-15 MED ORDER — LACTATED RINGERS IV SOLN: INTRAVENOUS | Status: DC | PRN

## 2023-04-15 MED ORDER — PHENYLEPHRINE HCL (PRESSORS) 10 MG/ML IV SOLN
INTRAVENOUS | Status: DC | PRN
  Administered 2023-04-15: 80 ug via INTRAVENOUS

## 2023-04-15 MED ORDER — LIDOCAINE HCL (CARDIAC) PF 100 MG/5ML IV SOSY
PREFILLED_SYRINGE | INTRAVENOUS | Status: DC | PRN
  Administered 2023-04-15: 100 mg via INTRATRACHEAL

## 2023-04-15 MED ORDER — EPOETIN ALFA-EPBX 40000 UNIT/ML IJ SOLN
40000.0000 [IU] | Freq: Once | INTRAMUSCULAR | Status: AC
  Administered 2023-04-15: 40000 [IU] via SUBCUTANEOUS
  Filled 2023-04-15: qty 1

## 2023-04-15 MED ORDER — PROPOFOL 10 MG/ML IV BOLUS
INTRAVENOUS | Status: DC | PRN
  Administered 2023-04-15: 20 mg via INTRAVENOUS
  Administered 2023-04-15: 50 mg via INTRAVENOUS
  Administered 2023-04-15 (×2): 10 mg via INTRAVENOUS

## 2023-04-15 NOTE — Transfer of Care (Signed)
Immediate Anesthesia Transfer of Care Note  Patient: David Duarte  Procedure(s) Performed: ESOPHAGOGASTRODUODENOSCOPY (EGD) WITH PROPOFOL MALONEY DILATION  Patient Location: PACU  Anesthesia Type:General  Level of Consciousness: awake, alert , oriented, and patient cooperative  Airway & Oxygen Therapy: Patient Spontanous Breathing  Post-op Assessment: Report given to RN, Post -op Vital signs reviewed and stable, and Patient moving all extremities X 4  Post vital signs: Reviewed and stable  Last Vitals:  Vitals Value Taken Time  BP 111/56 04/15/23 1450  Temp 36.8 C 04/15/23 1450  Pulse 80 04/15/23 1450  Resp 17 04/15/23 1450  SpO2 99 % 04/15/23 1450    Last Pain:  Vitals:   04/15/23 1450  TempSrc: Oral  PainSc: 0-No pain      Patients Stated Pain Goal: 7 (04/15/23 1102)  Complications: No notable events documented.

## 2023-04-15 NOTE — Progress Notes (Signed)
Patient presents today for Retacrit injection. Hemoglobin reviewed prior to administration. VSS tolerated without incident or complaint. See MAR for details. Patient stable during and after injection. Patient discharged in satisfactory condition with no s/s of distress noted.  

## 2023-04-15 NOTE — Discharge Instructions (Signed)
EGD Discharge instructions Please read the instructions outlined below and refer to this sheet in the next few weeks. These discharge instructions provide you with general information on caring for yourself after you leave the hospital. Your doctor may also give you specific instructions. While your treatment has been planned according to the most current medical practices available, unavoidable complications occasionally occur. If you have any problems or questions after discharge, please call your doctor. ACTIVITY You may resume your regular activity but move at a slower pace for the next 24 hours.  Take frequent rest periods for the next 24 hours.  Walking will help expel (get rid of) the air and reduce the bloated feeling in your abdomen.  No driving for 24 hours (because of the anesthesia (medicine) used during the test).  You may shower.  Do not sign any important legal documents or operate any machinery for 24 hours (because of the anesthesia used during the test).  NUTRITION Drink plenty of fluids.  You may resume your normal diet.  Begin with a light meal and progress to your normal diet.  Avoid alcoholic beverages for 24 hours or as instructed by your caregiver.  MEDICATIONS You may resume your normal medications unless your caregiver tells you otherwise.  WHAT YOU CAN EXPECT TODAY You may experience abdominal discomfort such as a feeling of fullness or "gas" pains.  FOLLOW-UP Your doctor will discuss the results of your test with you.  SEEK IMMEDIATE MEDICAL ATTENTION IF ANY OF THE FOLLOWING OCCUR: Excessive nausea (feeling sick to your stomach) and/or vomiting.  Severe abdominal pain and distention (swelling).  Trouble swallowing.  Temperature over 101 F (37.8 C).  Rectal bleeding or vomiting of blood.     I found no explanation for your symptoms on today's procedure.  Your esophagus was stretched.  We will arrange a stool sample for fecal elastase testing.   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

## 2023-04-15 NOTE — Patient Instructions (Signed)
[facility]  Discharge Instructions: Thank you for choosing [facility] to provide your oncology and hematology care.  If you have a lab appointment with the Cancer Center - please note that after April 8th, 2024, all labs will be drawn in the cancer center.  You do not have to check in or register with the main entrance as you have in the past but will complete your check-in in the cancer center.  Wear comfortable clothing and clothing appropriate for easy access to any Portacath or PICC line.   We strive to give you quality time with your provider. You may need to reschedule your appointment if you arrive late (15 or more minutes).  Arriving late affects you and other patients whose appointments are after yours.  Also, if you miss three or more appointments without notifying the office, you may be dismissed from the clinic at the provider's discretion.      For prescription refill requests, have your pharmacy contact our office and allow 72 hours for refills to be completed.    Today you received the following Retacrit, return as scheduled.   To help prevent nausea and vomiting after your treatment, we encourage you to take your nausea medication as directed.  BELOW ARE SYMPTOMS THAT SHOULD BE REPORTED IMMEDIATELY: *FEVER GREATER THAN 100.4 F (38 C) OR HIGHER *CHILLS OR SWEATING *NAUSEA AND VOMITING THAT IS NOT CONTROLLED WITH YOUR NAUSEA MEDICATION *UNUSUAL SHORTNESS OF BREATH *UNUSUAL BRUISING OR BLEEDING *URINARY PROBLEMS (pain or burning when urinating, or frequent urination) *BOWEL PROBLEMS (unusual diarrhea, constipation, pain near the anus) TENDERNESS IN MOUTH AND THROAT WITH OR WITHOUT PRESENCE OF ULCERS (sore throat, sores in mouth, or a toothache) UNUSUAL RASH, SWELLING OR PAIN  UNUSUAL VAGINAL DISCHARGE OR ITCHING   Items with * indicate a potential emergency and should be followed up as soon as possible or go to the Emergency Department if  any problems should occur.  Please show the CHEMOTHERAPY ALERT CARD or IMMUNOTHERAPY ALERT CARD at check-in to the Emergency Department and triage nurse.  Should you have questions after your visit or need to cancel or reschedule your appointment, please contact [facility] [phone]  and follow the prompts.  Office hours are 8:00 a.m. to 4:30 p.m. Monday - Friday. Please note that voicemails left after 4:00 p.m. may not be returned until the following business day.  We are closed weekends and major holidays. You have access to a nurse at all times for urgent questions. Please call the main number to the clinic [phone] and follow the prompts.  For any non-urgent questions, you may also contact your provider using MyChart. We now offer e-Visits for anyone 18 and older to request care online for non-urgent symptoms. For details visit [email or web address].   Also download the MyChart app! Go to the app store, search "MyChart", open the app, select [facility], and log in with your MyChart username and password.   

## 2023-04-15 NOTE — Anesthesia Preprocedure Evaluation (Signed)
Anesthesia Evaluation  Patient identified by MRN, date of birth, ID band Patient awake    Reviewed: Allergy & Precautions, H&P , NPO status , Patient's Chart, lab work & pertinent test results, reviewed documented beta blocker date and time   Airway Mallampati: II  TM Distance: >3 FB Neck ROM: full    Dental no notable dental hx.    Pulmonary neg pulmonary ROS, former smoker   Pulmonary exam normal breath sounds clear to auscultation       Cardiovascular Exercise Tolerance: Good hypertension, + CAD  negative cardio ROS + dysrhythmias  Rhythm:regular Rate:Normal     Neuro/Psych  PSYCHIATRIC DISORDERS Anxiety Depression     Neuromuscular disease negative neurological ROS  negative psych ROS   GI/Hepatic negative GI ROS, Neg liver ROS, hiatal hernia,GERD  ,,  Endo/Other  negative endocrine ROSdiabetes    Renal/GU Renal diseasenegative Renal ROS  negative genitourinary   Musculoskeletal   Abdominal   Peds  Hematology negative hematology ROS (+) Blood dyscrasia, anemia   Anesthesia Other Findings   Reproductive/Obstetrics negative OB ROS                             Anesthesia Physical Anesthesia Plan  ASA: 3  Anesthesia Plan: General   Post-op Pain Management:    Induction:   PONV Risk Score and Plan: Propofol infusion  Airway Management Planned:   Additional Equipment:   Intra-op Plan:   Post-operative Plan:   Informed Consent: I have reviewed the patients History and Physical, chart, labs and discussed the procedure including the risks, benefits and alternatives for the proposed anesthesia with the patient or authorized representative who has indicated his/her understanding and acceptance.     Dental Advisory Given  Plan Discussed with: CRNA  Anesthesia Plan Comments:        Anesthesia Quick Evaluation

## 2023-04-15 NOTE — Op Note (Signed)
[facility] Patient Name: David Duarte Procedure Date: 04/15/2023 2:09 PM MRN: [id number] Date of Birth: 19-Nov-1940 Attending MD: Gennette Pac , MD, [id number] CSN: [id number] Age: 82 Admit Type: Outpatient Procedure:                Upper GI endoscopy Indications:              Dysphagia, nausea, weight loss, abdominal pain Providers:                Gennette Pac, MD, Sheran Fava,                            Kristine L. Jessee Avers, Technician Referring MD:              Medicines:                Propofol per Anesthesia Complications:            No immediate complications. Estimated Blood Loss:     Estimated blood loss: none. Procedure:                Pre-Anesthesia Assessment:                           - Prior to the procedure, a History and Physical                            was performed, and patient medications and                            allergies were reviewed. The patient's tolerance of                            previous anesthesia was also reviewed. The risks                            and benefits of the procedure and the sedation                            options and risks were discussed with the patient.                            All questions were answered, and informed consent                            was obtained. Prior Anticoagulants: The patient has                            taken no anticoagulant or antiplatelet agents. ASA                            Grade Assessment: III - A patient with severe                            systemic disease. After reviewing the risks and  benefits, the patient was deemed in satisfactory                            condition to undergo the procedure.                           After obtaining informed consent, the endoscope was                            passed under direct vision. Throughout the                            procedure, the patient's blood pressure, pulse, and                             oxygen saturations were monitored continuously. The                            [id number]-[id number] ([id number]) scope was introduced through the                            mouth, and advanced to the second part of duodenum. Scope In: 2:40:49 PM Scope Out: 2:47:17 PM Total Procedure Duration: 0 hours 6 minutes 28 seconds  Findings:      1 superficial vein in the proximal esophagus noted. But no esophageal       varices tubular esophagus patent throughout its course. Mucosa appeared       normal. Stomach empty. Mild changes of portal hypertensive gastropathy.       No varices. No ulcer or infiltrative process. Patent pylorus.      The duodenal bulb and second portion of the duodenum were normal. Scope       was withdrawn. A 56 French Maloney dilator is passed full insertion with       mild resistance. A look back revealed no change related to this       maneuver. There was no bleeding or apparent complication Impression:               - Patent tubular esophagus. Status post Maloney                            dilation.                           -Mild portal hypertensive gastropathy                           Normal duodenal bulb and second portion of the                            duodenum.                           - No specimens collected. This gentleman has had a                            significant evaluation for anemia dysphagia nausea  and weight loss. Really, having failure to thrive.                            Has a long history of alcohol use. Need to screen                            him further for chronic pancreatitis. Moderate Sedation:      Moderate (conscious) sedation was personally administered by an       anesthesia professional. The following parameters were monitored: oxygen       saturation, heart rate, blood pressure, respiratory rate, EKG, adequacy       of pulmonary ventilation, and response to care. Recommendation:           -  Patient has a contact number available for                            emergencies. The signs and symptoms of potential                            delayed complications were discussed with the                            patient. Return to normal activities tomorrow.                            Written discharge instructions were provided to the                            patient.                           - Advance diet as tolerated. Continue present                            medications. Will arrange fecal elastase testing                            through the office to evaluate for chronic                            pancreatitis. Keep appointment for abdominal                            ultrasound. Office visit with [place] in 1 month. Procedure Code(s):        --- Professional ---                           [phone], Esophagogastroduodenoscopy, flexible,                            transoral; diagnostic, including collection of                            specimen(s) by brushing or washing, when performed                            (  separate procedure) Diagnosis Code(s):        --- Professional ---                           R13.10, Dysphagia, unspecified CPT copyright 2022 American Medical Association. All rights reserved. The codes documented in this report are preliminary and upon coder review may  be revised to meet current compliance requirements. Gerrit Friends. Lynnie Koehler, MD Gennette Pac, MD 04/15/2023 2:58:45 PM This report has been signed electronically. Number of Addenda: 0

## 2023-04-15 NOTE — Interval H&P Note (Signed)
History and Physical Interval Note:  04/15/2023 2:27 PM  David Duarte  has presented today for surgery, with the diagnosis of N/V,aversion to food, weight loss.  The various methods of treatment have been discussed with the patient and family. After consideration of risks, benefits and other options for treatment, the patient has consented to  Procedure(s) with comments: ESOPHAGOGASTRODUODENOSCOPY (EGD) WITH PROPOFOL (N/A) - 2:30 pm, asa 3 MALONEY DILATION (N/A) as a surgical intervention.  The patient's history has been reviewed, patient examined, no change in status, stable for surgery.  I have reviewed the patient's chart and labs.  Questions were answered to the patient's satisfaction.     David Duarte    No change.  Ultrasound has not been done as of yet.  Endorses ongoing esophageal dysphagia.  Distant history of heavy alcohol use.  EGD with esophageal dilation, etc. as feasible/appropriate today per plan. The risks, benefits, limitations, alternatives and imponderables have been reviewed with the patient. Potential for esophageal dilation, biopsy, etc. have also been reviewed.  Questions have been answered. All parties agreeable.

## 2023-04-17 LAB — SURGICAL PATHOLOGY

## 2023-04-17 NOTE — Anesthesia Postprocedure Evaluation (Signed)
Anesthesia Post Note  Patient: David Duarte  Procedure(s) Performed: ESOPHAGOGASTRODUODENOSCOPY (EGD) WITH PROPOFOL MALONEY DILATION  Patient location during evaluation: Phase II Anesthesia Type: General Level of consciousness: awake Pain management: pain level controlled Vital Signs Assessment: post-procedure vital signs reviewed and stable Respiratory status: spontaneous breathing and respiratory function stable Cardiovascular status: blood pressure returned to baseline and stable Postop Assessment: no headache and no apparent nausea or vomiting Anesthetic complications: no Comments: Late entry   No notable events documented.   Last Vitals:  Vitals:   04/15/23 1102 04/15/23 1450  BP: (!) 154/62 (!) 111/56  Pulse: 64 80  Resp: 16 17  Temp: 36.7 C 36.8 C  SpO2: 100% 99%    Last Pain:  Vitals:   04/15/23 1450  TempSrc: Oral  PainSc: 0-No pain                 Windell Norfolk

## 2023-04-22 ENCOUNTER — Inpatient Hospital Stay: Payer: Medicare Other

## 2023-04-22 ENCOUNTER — Inpatient Hospital Stay: Payer: Medicare Other | Admitting: Oncology

## 2023-04-22 ENCOUNTER — Encounter (HOSPITAL_COMMUNITY): Payer: Self-pay | Admitting: Internal Medicine
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
## 2023-04-23 ENCOUNTER — Other Ambulatory Visit: Payer: Self-pay | Admitting: Physician Assistant

## 2023-04-23 ENCOUNTER — Inpatient Hospital Stay: Payer: Medicare Other

## 2023-04-23 ENCOUNTER — Encounter (HOSPITAL_COMMUNITY): Payer: Self-pay | Admitting: Physician Assistant

## 2023-04-23 ENCOUNTER — Inpatient Hospital Stay (HOSPITAL_BASED_OUTPATIENT_CLINIC_OR_DEPARTMENT_OTHER): Payer: Medicare Other | Admitting: Physician Assistant

## 2023-04-23 VITALS — BP 114/68 | HR 62 | Temp 98.6°F | Resp 17 | Ht 72.0 in | Wt 225.0 lb

## 2023-04-23 DIAGNOSIS — D631 Anemia in chronic kidney disease: Secondary | ICD-10-CM

## 2023-04-23 DIAGNOSIS — D539 Nutritional anemia, unspecified: Secondary | ICD-10-CM

## 2023-04-23 DIAGNOSIS — N1832 Chronic kidney disease, stage 3b: Secondary | ICD-10-CM | POA: Diagnosis not present

## 2023-04-23 DIAGNOSIS — D72821 Monocytosis (symptomatic): Secondary | ICD-10-CM

## 2023-04-23 DIAGNOSIS — D500 Iron deficiency anemia secondary to blood loss (chronic): Secondary | ICD-10-CM

## 2023-04-23 LAB — CBC
HCT: 28.9 % — ABNORMAL LOW (ref 39.0–52.0)
Hemoglobin: 8.6 g/dL — ABNORMAL LOW (ref 13.0–17.0)
MCH: 30.2 pg (ref 26.0–34.0)
MCHC: 29.8 g/dL — ABNORMAL LOW (ref 30.0–36.0)
MCV: 101.4 fL — ABNORMAL HIGH (ref 80.0–100.0)
Platelets: 184 10*3/uL (ref 150–400)
RBC: 2.85 MIL/uL — ABNORMAL LOW (ref 4.22–5.81)
RDW: 18.5 % — ABNORMAL HIGH (ref 11.5–15.5)
WBC: 6.6 10*3/uL (ref 4.0–10.5)
nRBC: 0 % (ref 0.0–0.2)

## 2023-04-23 LAB — SAMPLE TO BLOOD BANK

## 2023-04-23 MED ORDER — EPOETIN ALFA-EPBX 40000 UNIT/ML IJ SOLN
40000.0000 [IU] | Freq: Once | INTRAMUSCULAR | Status: AC
  Administered 2023-04-23: 40000 [IU] via SUBCUTANEOUS
  Filled 2023-04-23: qty 1

## 2023-04-23 NOTE — Progress Notes (Signed)
Patient presents today for follow up appointment with R. Pennington PA and Retacrit injection. Blood pressure within parameters for treatment. HGB 8.6 today.   David Duarte presents today for injection per the provider's orders.  Retacrit 40,000 units administration without incident; injection site WNL; see MAR for injection details.  Patient tolerated procedure well and without incident.  No questions or complaints noted at this time. Discharged from by wheel chair in stable condition. Alert and oriented x 3. F/U with [facility] as scheduled.

## 2023-04-23 NOTE — Patient Instructions (Signed)
[facility] **VISIT SUMMARY & IMPORTANT INSTRUCTIONS **   You were seen today by Rojelio Brenner PA-C for your anemia.   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 We will check some additional blood tests to confirm this diagnosis. We will continue Retacrit injections every week. You will see Dr. Ellin Saba for follow-up in 2 to 3 weeks.  ** Thank you for trusting me with your healthcare!  I strive to provide all of my patients with quality care at each visit.  If you receive a survey for this visit, I would be so grateful to you for taking the time to provide feedback.  Thank you in advance!  ~ Jayvon Mounger                   Dr. Doreatha Massed   &   Rojelio Brenner, PA-C   - - - - - - - - - - - - - - - - - -    Thank you for choosing  [facility] Center at [facility] to provide your oncology and hematology care.  To afford each patient quality time with our provider, please arrive at least 15 minutes before your scheduled appointment time.   If you have a lab appointment with the Cancer Center please come in thru the Main Entrance and check in at the main information desk.  You need to re-schedule your appointment should you arrive 10 or more minutes late.  We strive to give you quality time with our providers, and arriving late affects you and other patients whose appointments are after yours.  Also, if you no show three or more times for appointments you may be dismissed from the clinic at the providers discretion.     Again, thank you for choosing [facility].  Our hope is that these requests will decrease the amount of time that you wait before being seen by our physicians.       _____________________________________________________________  Should you have questions after your visit to [facility], please contact  our office at [phone] and follow the prompts.  Our office hours are 8:00 a.m. and 4:30 p.m. Monday - Friday.  Please note that voicemails left after 4:00 p.m. may not be returned until the following business day.  We are closed weekends and major holidays.  You do have access to a nurse 24-7, just call the main number to the clinic [phone] and do not press any options, hold on the line and a nurse will answer the phone.    For prescription refill requests, have your pharmacy contact our office and allow 72 hours.

## 2023-04-23 NOTE — Patient Instructions (Signed)
[facility] [place]  Discharge Instructions: Thank you for choosing [facility] to provide your oncology and hematology care.  If you have a lab appointment with the Cancer Center - please note that after April 8th, 2024, all labs will be drawn in the cancer center.  You do not have to check in or register with the main entrance as you have in the past but will complete your check-in in the cancer center.  Wear comfortable clothing and clothing appropriate for easy access to any Portacath or PICC line.   We strive to give you quality time with your provider. You may need to reschedule your appointment if you arrive late (15 or more minutes).  Arriving late affects you and other patients whose appointments are after yours.  Also, if you miss three or more appointments without notifying the office, you may be dismissed from the clinic at the provider's discretion.      For prescription refill requests, have your pharmacy contact our office and allow 72 hours for refills to be completed.    Today you received the following chemotherapy and/or immunotherapy agents Retacrit injection.  Epoetin Alfa Injection What is this medication? EPOETIN ALFA (e POE e tin AL fa) treats low levels of red blood cells (anemia) caused by kidney disease, chemotherapy, or HIV medications. It can also be used in people who are at risk for blood loss during surgery. It works by helping your body make more red blood cells, which reduces the need for blood transfusions. This medicine may be used for other purposes; ask your health care provider or pharmacist if you have questions. COMMON BRAND NAME(S): Epogen, Procrit, Retacrit What should I tell my care team before I take this medication? They need to know if you have any of these conditions: Blood clots Cancer Heart disease High blood pressure On dialysis Seizures Stroke An unusual or allergic reaction to epoetin alfa, albumin, benzyl  alcohol, other medications, foods, dyes, or preservatives Pregnant or trying to get pregnant Breast-feeding How should I use this medication? This medication is injected into a vein or under the skin. It is usually given by your care team in a hospital or clinic setting. It may also be given at home. If you get this medication at home, you will be taught how to prepare and give it. Use exactly as directed. Take it as directed on the prescription label at the same time every day. Keep taking it unless your care team tells you to stop. It is important that you put your used needles and syringes in a special sharps container. Do not put them in a trash can. If you do not have a sharps container, call your pharmacist or care team to get one. A special MedGuide will be given to you by the pharmacist with each prescription and refill. Be sure to read this information carefully each time. Talk to your care team about the use of this medication in children. While this medication may be used in children as young as 1 month of age for selected conditions, precautions do apply. Overdosage: If you think you have taken too much of this medicine contact a poison control center or emergency room at once. NOTE: This medicine is only for you. Do not share this medicine with others. What if I miss a dose? If you miss a dose, take it as soon as you can. If it is almost time for your next dose, take only that dose.   Do not take double or extra doses. What may interact with this medication? Darbepoetin alfa Methoxy polyethylene glycol-epoetin beta This list may not describe all possible interactions. Give your health care provider a list of all the medicines, herbs, non-prescription drugs, or dietary supplements you use. Also tell them if you smoke, drink alcohol, or use illegal drugs. Some items may interact with your medicine. What should I watch for while using this medication? Visit your care team for regular checks  on your progress. Check your blood pressure as directed. Know what your blood pressure should be and when to contact your care team. Your condition will be monitored carefully while you are receiving this medication. You may need blood work while taking this medication. What side effects may I notice from receiving this medication? Side effects that you should report to your care team as soon as possible: Allergic reactions--skin rash, itching, hives, swelling of the face, lips, tongue, or throat Blood clot--pain, swelling, or warmth in the leg, shortness of breath, chest pain Heart attack--pain or tightness in the chest, shoulders, arms, or jaw, nausea, shortness of breath, cold or clammy skin, feeling faint or lightheaded Increase in blood pressure Rash, fever, and swollen lymph nodes Redness, blistering, peeling, or loosening of the skin, including inside the mouth Seizures Stroke--sudden numbness or weakness of the face, arm, or leg, trouble speaking, confusion, trouble walking, loss of balance or coordination, dizziness, severe headache, change in vision Side effects that usually do not require medical attention (report to your care team if they continue or are bothersome): Bone, joint, or muscle pain Cough Headache Nausea Pain, redness, or irritation at injection site This list may not describe all possible side effects. Call your doctor for medical advice about side effects. You may report side effects to FDA at 1-800-FDA-1088. Where should I keep my medication? Keep out of the reach of children and pets. Store in a refrigerator. Do not freeze. Do not shake. Protect from light. Keep this medication in the original container until you are ready to take it. See product for storage information. Get rid of any unused medication after the expiration date. To get rid of medications that are no longer needed or have expired: Take the medication to a medication take-back program. Check with  your pharmacy or law enforcement to find a location. If you cannot return the medication, ask your pharmacist or care team how to get rid of the medication safely. NOTE: This sheet is a summary. It may not cover all possible information. If you have questions about this medicine, talk to your doctor, pharmacist, or health care provider.  2024 Elsevier/Gold Standard (2022-02-28 00:00:00)       To help prevent nausea and vomiting after your treatment, we encourage you to take your nausea medication as directed.  BELOW ARE SYMPTOMS THAT SHOULD BE REPORTED IMMEDIATELY: *FEVER GREATER THAN 100.4 F (38 C) OR HIGHER *CHILLS OR SWEATING *NAUSEA AND VOMITING THAT IS NOT CONTROLLED WITH YOUR NAUSEA MEDICATION *UNUSUAL SHORTNESS OF BREATH *UNUSUAL BRUISING OR BLEEDING *URINARY PROBLEMS (pain or burning when urinating, or frequent urination) *BOWEL PROBLEMS (unusual diarrhea, constipation, pain near the anus) TENDERNESS IN MOUTH AND THROAT WITH OR WITHOUT PRESENCE OF ULCERS (sore throat, sores in mouth, or a toothache) UNUSUAL RASH, SWELLING OR PAIN  UNUSUAL VAGINAL DISCHARGE OR ITCHING   Items with * indicate a potential emergency and should be followed up as soon as possible or go to the Emergency Department if any problems should occur.  Please   show the CHEMOTHERAPY ALERT CARD or IMMUNOTHERAPY ALERT CARD at check-in to the Emergency Department and triage nurse.  Should you have questions after your visit or need to cancel or reschedule your appointment, please contact [facility] [phone]  and follow the prompts.  Office hours are 8:00 a.m. to 4:30 p.m. Monday - Friday. Please note that voicemails left after 4:00 p.m. may not be returned until the following business day.  We are closed weekends and major holidays. You have access to a nurse at all times for urgent questions. Please call the main number to the clinic [phone] and follow the prompts.  For any non-urgent  questions, you may also contact your provider using MyChart. We now offer e-Visits for anyone 18 and older to request care online for non-urgent symptoms. For details visit [email or web address].   Also download the MyChart app! Go to the app store, search "MyChart", open the app, select [facility], and log in with your MyChart username and password.   

## 2023-04-28 ENCOUNTER — Encounter (HOSPITAL_COMMUNITY): Payer: Self-pay | Admitting: Physician Assistant

## 2023-04-30 ENCOUNTER — Inpatient Hospital Stay: Payer: Medicare Other

## 2023-04-30 VITALS — BP 115/65 | HR 73 | Temp 97.6°F | Resp 16

## 2023-04-30 DIAGNOSIS — D500 Iron deficiency anemia secondary to blood loss (chronic): Secondary | ICD-10-CM

## 2023-04-30 DIAGNOSIS — N1832 Chronic kidney disease, stage 3b: Secondary | ICD-10-CM

## 2023-04-30 DIAGNOSIS — D539 Nutritional anemia, unspecified: Secondary | ICD-10-CM

## 2023-04-30 LAB — CBC
HCT: 28.9 % — ABNORMAL LOW (ref 39.0–52.0)
Hemoglobin: 8.5 g/dL — ABNORMAL LOW (ref 13.0–17.0)
MCH: 30.2 pg (ref 26.0–34.0)
MCHC: 29.4 g/dL — ABNORMAL LOW (ref 30.0–36.0)
MCV: 102.8 fL — ABNORMAL HIGH (ref 80.0–100.0)
Platelets: 175 10*3/uL (ref 150–400)
RBC: 2.81 MIL/uL — ABNORMAL LOW (ref 4.22–5.81)
RDW: 19.2 % — ABNORMAL HIGH (ref 11.5–15.5)
WBC: 7.9 10*3/uL (ref 4.0–10.5)
nRBC: 0 % (ref 0.0–0.2)

## 2023-04-30 LAB — SAMPLE TO BLOOD BANK

## 2023-04-30 MED ORDER — EPOETIN ALFA-EPBX 40000 UNIT/ML IJ SOLN
40000.0000 [IU] | Freq: Once | INTRAMUSCULAR | Status: AC
  Administered 2023-04-30: 40000 [IU] via SUBCUTANEOUS
  Filled 2023-04-30: qty 1

## 2023-04-30 NOTE — Progress Notes (Signed)
Patient presents today for Retacrit injection per providers order.  Vital signs within parameters for injection.  Patient has no new complaints at this time.

## 2023-04-30 NOTE — Patient Instructions (Signed)
[facility] [place]  Discharge Instructions: Thank you for choosing [facility] to provide your oncology and hematology care.  If you have a lab appointment with the Cancer Center - please note that after April 8th, 2024, all labs will be drawn in the cancer center.  You do not have to check in or register with the main entrance as you have in the past but will complete your check-in in the cancer center.  Wear comfortable clothing and clothing appropriate for easy access to any Portacath or PICC line.   We strive to give you quality time with your provider. You may need to reschedule your appointment if you arrive late (15 or more minutes).  Arriving late affects you and other patients whose appointments are after yours.  Also, if you miss three or more appointments without notifying the office, you may be dismissed from the clinic at the provider's discretion.      For prescription refill requests, have your pharmacy contact our office and allow 72 hours for refills to be completed.    Today you received the following chemotherapy and/or immunotherapy agents Retacrit      To help prevent nausea and vomiting after your treatment, we encourage you to take your nausea medication as directed.  BELOW ARE SYMPTOMS THAT SHOULD BE REPORTED IMMEDIATELY: *FEVER GREATER THAN 100.4 F (38 C) OR HIGHER *CHILLS OR SWEATING *NAUSEA AND VOMITING THAT IS NOT CONTROLLED WITH YOUR NAUSEA MEDICATION *UNUSUAL SHORTNESS OF BREATH *UNUSUAL BRUISING OR BLEEDING *URINARY PROBLEMS (pain or burning when urinating, or frequent urination) *BOWEL PROBLEMS (unusual diarrhea, constipation, pain near the anus) TENDERNESS IN MOUTH AND THROAT WITH OR WITHOUT PRESENCE OF ULCERS (sore throat, sores in mouth, or a toothache) UNUSUAL RASH, SWELLING OR PAIN  UNUSUAL VAGINAL DISCHARGE OR ITCHING   Items with * indicate a potential emergency and should be followed up as soon as possible or go to the  Emergency Department if any problems should occur.  Please show the CHEMOTHERAPY ALERT CARD or IMMUNOTHERAPY ALERT CARD at check-in to the Emergency Department and triage nurse.  Should you have questions after your visit or need to cancel or reschedule your appointment, please contact [facility] [phone]  and follow the prompts.  Office hours are 8:00 a.m. to 4:30 p.m. Monday - Friday. Please note that voicemails left after 4:00 p.m. may not be returned until the following business day.  We are closed weekends and major holidays. You have access to a nurse at all times for urgent questions. Please call the main number to the clinic [phone] and follow the prompts.  For any non-urgent questions, you may also contact your provider using MyChart. We now offer e-Visits for anyone 18 and older to request care online for non-urgent symptoms. For details visit [email or web address].   Also download the MyChart app! Go to the app store, search "MyChart", open the app, select [facility], and log in with your MyChart username and password.   

## 2023-05-04 ENCOUNTER — Telehealth: Payer: Self-pay | Admitting: Radiology

## 2023-05-04 ENCOUNTER — Other Ambulatory Visit: Payer: Self-pay | Admitting: Orthopaedic Surgery

## 2023-05-04 MED ORDER — HYDROCODONE-ACETAMINOPHEN 5-325 MG PO TABS: 1.0000 | ORAL_TABLET | Freq: Four times a day (QID) | ORAL | 0 refills | Status: DC | PRN

## 2023-05-04 NOTE — Telephone Encounter (Signed)
[name] called and asked if you could please send in some pain medication for David Duarte to [facility] for his knee? 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  He is currently waiting for additional test results. He is having a lot of pain in the knee.  Please advise.

## 2023-05-05 NOTE — Telephone Encounter (Signed)
noted 

## 2023-05-07 ENCOUNTER — Inpatient Hospital Stay: Payer: Medicare Other

## 2023-05-07 VITALS — BP 117/62 | HR 61 | Temp 97.6°F | Resp 16

## 2023-05-07 DIAGNOSIS — N1832 Chronic kidney disease, stage 3b: Secondary | ICD-10-CM

## 2023-05-07 DIAGNOSIS — D500 Iron deficiency anemia secondary to blood loss (chronic): Secondary | ICD-10-CM

## 2023-05-07 DIAGNOSIS — D539 Nutritional anemia, unspecified: Secondary | ICD-10-CM

## 2023-05-07 LAB — CBC
HCT: 26.4 % — ABNORMAL LOW (ref 39.0–52.0)
Hemoglobin: 8 g/dL — ABNORMAL LOW (ref 13.0–17.0)
MCH: 31.5 pg (ref 26.0–34.0)
MCHC: 30.3 g/dL (ref 30.0–36.0)
MCV: 103.9 fL — ABNORMAL HIGH (ref 80.0–100.0)
Platelets: 156 10*3/uL (ref 150–400)
RBC: 2.54 MIL/uL — ABNORMAL LOW (ref 4.22–5.81)
RDW: 19.3 % — ABNORMAL HIGH (ref 11.5–15.5)
WBC: 7.4 10*3/uL (ref 4.0–10.5)
nRBC: 0 % (ref 0.0–0.2)

## 2023-05-07 LAB — SAMPLE TO BLOOD BANK

## 2023-05-07 MED ORDER — EPOETIN ALFA-EPBX 40000 UNIT/ML IJ SOLN
40000.0000 [IU] | Freq: Once | INTRAMUSCULAR | Status: AC
  Administered 2023-05-07: 40000 [IU] via SUBCUTANEOUS
  Filled 2023-05-07: qty 1

## 2023-05-07 NOTE — Patient Instructions (Signed)
[facility] [place]  Discharge Instructions: Thank you for choosing [facility] to provide your oncology and hematology care.  If you have a lab appointment with the Cancer Center - please note that after April 8th, 2024, all labs will be drawn in the cancer center.  You do not have to check in or register with the main entrance as you have in the past but will complete your check-in in the cancer center.  Wear comfortable clothing and clothing appropriate for easy access to any Portacath or PICC line.   We strive to give you quality time with your provider. You may need to reschedule your appointment if you arrive late (15 or more minutes).  Arriving late affects you and other patients whose appointments are after yours.  Also, if you miss three or more appointments without notifying the office, you may be dismissed from the clinic at the provider's discretion.      For prescription refill requests, have your pharmacy contact our office and allow 72 hours for refills to be completed.    Today you received the following Retacrit, return as scheduled.   To help prevent nausea and vomiting after your treatment, we encourage you to take your nausea medication as directed.  BELOW ARE SYMPTOMS THAT SHOULD BE REPORTED IMMEDIATELY: *FEVER GREATER THAN 100.4 F (38 C) OR HIGHER *CHILLS OR SWEATING *NAUSEA AND VOMITING THAT IS NOT CONTROLLED WITH YOUR NAUSEA MEDICATION *UNUSUAL SHORTNESS OF BREATH *UNUSUAL BRUISING OR BLEEDING *URINARY PROBLEMS (pain or burning when urinating, or frequent urination) *BOWEL PROBLEMS (unusual diarrhea, constipation, pain near the anus) TENDERNESS IN MOUTH AND THROAT WITH OR WITHOUT PRESENCE OF ULCERS (sore throat, sores in mouth, or a toothache) UNUSUAL RASH, SWELLING OR PAIN  UNUSUAL VAGINAL DISCHARGE OR ITCHING   Items with * indicate a potential emergency and should be followed up as soon as possible or go to the Emergency Department if  any problems should occur.  Please show the CHEMOTHERAPY ALERT CARD or IMMUNOTHERAPY ALERT CARD at check-in to the Emergency Department and triage nurse.  Should you have questions after your visit or need to cancel or reschedule your appointment, please contact [facility] AT  [phone]  and follow the prompts.  Office hours are 8:00 a.m. to 4:30 p.m. Monday - Friday. Please note that voicemails left after 4:00 p.m. may not be returned until the following business day.  We are closed weekends and major holidays. You have access to a nurse at all times for urgent questions. Please call the main number to the clinic [phone] and follow the prompts.  For any non-urgent questions, you may also contact your provider using MyChart. We now offer e-Visits for anyone 18 and older to request care online for non-urgent symptoms. For details visit [email or web address].   Also download the MyChart app! Go to the app store, search "MyChart", open the app, select [facility], and log in with your MyChart username and password.   

## 2023-05-07 NOTE — Progress Notes (Signed)
Patient presents today for Retacrit injection. Hemoglobin reviewed prior to administration. VSS tolerated without incident or complaint. See MAR for details. Patient stable during and after injection. Patient discharged in satisfactory condition with no s/s of distress noted.  

## 2023-05-13 LAB — INTELLIGEN MYELOID

## 2023-05-18 ENCOUNTER — Encounter: Payer: Self-pay | Admitting: Hematology

## 2023-05-18 ENCOUNTER — Inpatient Hospital Stay: Payer: Medicare Other | Attending: Hematology | Admitting: Hematology

## 2023-05-18 ENCOUNTER — Inpatient Hospital Stay: Payer: Medicare Other

## 2023-05-18 VITALS — BP 118/70 | HR 63 | Temp 98.3°F | Resp 18 | Ht 72.0 in | Wt 225.0 lb

## 2023-05-18 DIAGNOSIS — D631 Anemia in chronic kidney disease: Secondary | ICD-10-CM | POA: Insufficient documentation

## 2023-05-18 DIAGNOSIS — D469 Myelodysplastic syndrome, unspecified: Secondary | ICD-10-CM | POA: Diagnosis present

## 2023-05-18 DIAGNOSIS — N1832 Chronic kidney disease, stage 3b: Secondary | ICD-10-CM | POA: Insufficient documentation

## 2023-05-18 DIAGNOSIS — D539 Nutritional anemia, unspecified: Secondary | ICD-10-CM

## 2023-05-18 DIAGNOSIS — Z79899 Other long term (current) drug therapy: Secondary | ICD-10-CM | POA: Diagnosis not present

## 2023-05-18 DIAGNOSIS — D500 Iron deficiency anemia secondary to blood loss (chronic): Secondary | ICD-10-CM

## 2023-05-18 LAB — CBC
HCT: 28.6 % — ABNORMAL LOW (ref 39.0–52.0)
Hemoglobin: 8.4 g/dL — ABNORMAL LOW (ref 13.0–17.0)
MCH: 31.5 pg (ref 26.0–34.0)
MCHC: 29.4 g/dL — ABNORMAL LOW (ref 30.0–36.0)
MCV: 107.1 fL — ABNORMAL HIGH (ref 80.0–100.0)
Platelets: 164 10*3/uL (ref 150–400)
RBC: 2.67 MIL/uL — ABNORMAL LOW (ref 4.22–5.81)
RDW: 19.4 % — ABNORMAL HIGH (ref 11.5–15.5)
WBC: 7.3 10*3/uL (ref 4.0–10.5)
nRBC: 0 % (ref 0.0–0.2)

## 2023-05-18 LAB — SAMPLE TO BLOOD BANK

## 2023-05-18 MED ORDER — LUSPATERCEPT-AAMT 75 MG ~~LOC~~ SOLR
1.0000 mg/kg | Freq: Once | SUBCUTANEOUS | Status: AC
  Administered 2023-05-18: 100 mg via SUBCUTANEOUS
  Filled 2023-05-18: qty 0.5

## 2023-05-18 NOTE — Progress Notes (Signed)
Patient tolerated injection with no complaints voiced.  Site clean and dry with no bruising or swelling noted at site.  See MAR for details.  Band aid applied.  Patient stable during and after injection.  Vss with discharge and left in satisfactory condition with no s/s of distress noted.  

## 2023-05-18 NOTE — Patient Instructions (Addendum)
[facility] Discharge Instructions   You were seen and examined today by Dr. Ellin Saba.  He reviewed the results of your bone marrow biopsy and the special blood test that we sent. 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   Dr. Kirtland Bouchard would like to switch your injections to a medication called Reblozyl. This is given in the clinic every 3 weeks. This is given to help improve your hemoglobin.   Return as scheduled.    Thank you for choosing [facility] at [facility] to provide your oncology and hematology care.  To afford each patient quality time with our provider, please arrive at least 15 minutes before your scheduled appointment time.   If you have a lab appointment with the Cancer Center please come in thru the Main Entrance and check in at the main information desk.  You need to re-schedule your appointment should you arrive 10 or more minutes late.  We strive to give you quality time with our providers, and arriving late affects you and other patients whose appointments are after yours.  Also, if you no show three or more times for appointments you may be dismissed from the clinic at the providers discretion.     Again, thank you for choosing [facility].  Our hope is that these requests will decrease the amount of time that you wait before being seen by our physicians.       _____________________________________________________________  Should you have questions after your visit to [facility], please contact our office at [phone] and follow the prompts.  Our office hours are 8:00 a.m. and 4:30 p.m. Monday - Friday.  Please note that voicemails left after 4:00 p.m. may not be returned until the following business day.  We are closed weekends and major holidays.  You do have access to a nurse 24-7, just call  the main number to the clinic [phone] and do not press any options, hold on the line and a nurse will answer the phone.    For prescription refill requests, have your pharmacy contact our office and allow 72 hours.    Due to Covid, you will need to wear a mask upon entering the hospital. If you do not have a mask, a mask will be given to you at the Main Entrance upon arrival. For doctor visits, patients may have 1 support person age 67 or older with them. For treatment visits, patients can not have anyone with them due to social distancing guidelines and our immunocompromised population.

## 2023-05-18 NOTE — Progress Notes (Signed)
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 500 Intent of Therapy: Non-Curative / Palliative Intent, Discussed with Patient
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
## 2023-05-18 NOTE — Patient Instructions (Signed)
[facility]  Discharge Instructions: Thank you for choosing [facility] to provide your oncology and hematology care.  If you have a lab appointment with the Cancer Center - please note that after April 8th, 2024, all labs will be drawn in the cancer center.  You do not have to check in or register with the main entrance as you have in the past but will complete your check-in in the cancer center.  Wear comfortable clothing and clothing appropriate for easy access to any Portacath or PICC line.   We strive to give you quality time with your provider. You may need to reschedule your appointment if you arrive late (15 or more minutes).  Arriving late affects you and other patients whose appointments are after yours.  Also, if you miss three or more appointments without notifying the office, you may be dismissed from the clinic at the provider's discretion.      For prescription refill requests, have your pharmacy contact our office and allow 72 hours for refills to be completed.  To help prevent nausea and vomiting after your treatment, we encourage you to take your nausea medication as directed.  BELOW ARE SYMPTOMS THAT SHOULD BE REPORTED IMMEDIATELY: *FEVER GREATER THAN 100.4 F (38 C) OR HIGHER *CHILLS OR SWEATING *NAUSEA AND VOMITING THAT IS NOT CONTROLLED WITH YOUR NAUSEA MEDICATION *UNUSUAL SHORTNESS OF BREATH *UNUSUAL BRUISING OR BLEEDING *URINARY PROBLEMS (pain or burning when urinating, or frequent urination) *BOWEL PROBLEMS (unusual diarrhea, constipation, pain near the anus) TENDERNESS IN MOUTH AND THROAT WITH OR WITHOUT PRESENCE OF ULCERS (sore throat, sores in mouth, or a toothache) UNUSUAL RASH, SWELLING OR PAIN  UNUSUAL VAGINAL DISCHARGE OR ITCHING   Items with * indicate a potential emergency and should be followed up as soon as possible or go to the Emergency Department if any problems should occur.  Please show the CHEMOTHERAPY ALERT CARD or  IMMUNOTHERAPY ALERT CARD at check-in to the Emergency Department and triage nurse.  Should you have questions after your visit or need to cancel or reschedule your appointment, please contact [facility] [phone]  and follow the prompts.  Office hours are 8:00 a.m. to 4:30 p.m. Monday - Friday. Please note that voicemails left after 4:00 p.m. may not be returned until the following business day.  We are closed weekends and major holidays. You have access to a nurse at all times for urgent questions. Please call the main number to the clinic [phone] and follow the prompts.  For any non-urgent questions, you may also contact your provider using MyChart. We now offer e-Visits for anyone 18 and older to request care online for non-urgent symptoms. For details visit [email or web address].   Also download the MyChart app! Go to the app store, search "MyChart", open the app, select [facility], and log in with your MyChart username and password.   

## 2023-05-19 ENCOUNTER — Other Ambulatory Visit: Payer: Self-pay

## 2023-05-21 ENCOUNTER — Other Ambulatory Visit: Payer: Self-pay

## 2023-05-27 ENCOUNTER — Other Ambulatory Visit: Payer: Self-pay | Admitting: Family Medicine

## 2023-05-27 DIAGNOSIS — I152 Hypertension secondary to endocrine disorders: Secondary | ICD-10-CM

## 2023-05-29 ENCOUNTER — Telehealth: Payer: Self-pay | Admitting: Family Medicine

## 2023-05-29 NOTE — Telephone Encounter (Signed)
Contacted [facility] and advised that patient only has omeprazole on his current med list. Pharmacist verbalized understanding

## 2023-05-29 NOTE — Telephone Encounter (Signed)
Please call back to clarify if pt is take omeprazole (PRILOSEC) 40 MG capsule and/or pantoprazole (PROTONIX) 40 MG tablet . Please call back with ref [phone]
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
## 2023-06-08 ENCOUNTER — Inpatient Hospital Stay: Payer: Medicare Other | Admitting: Hematology

## 2023-06-08 ENCOUNTER — Inpatient Hospital Stay: Payer: Medicare Other

## 2023-06-09 ENCOUNTER — Other Ambulatory Visit: Payer: Self-pay

## 2023-06-15 ENCOUNTER — Other Ambulatory Visit: Payer: Self-pay

## 2023-06-17 ENCOUNTER — Telehealth: Payer: Self-pay

## 2023-06-17 NOTE — Transitions of Care (Post Inpatient/ED Visit) (Unsigned)
   06/17/2023  Name: David Duarte MRN: [id number] DOB: 06-19-1941  Today's TOC FU Call Status: Today's TOC FU Call Status:: Unsuccessful Call (1st Attempt) Unsuccessful Call (1st Attempt) Date: 06/17/23  Attempted to reach the patient regarding the most recent Inpatient/ED visit.  Follow Up Plan: Additional outreach attempts will be made to reach the patient to complete the Transitions of Care (Post Inpatient/ED visit) call.   Signature Karena Addison, LPN [facility] Nurse Health Advisor Direct Dial [phone]

## 2023-06-18 ENCOUNTER — Ambulatory Visit: Payer: Medicare Other | Admitting: Family Medicine

## 2023-06-18 NOTE — Transitions of Care (Post Inpatient/ED Visit) (Signed)
06/18/2023  Name: David Duarte MRN: [id number] DOB: 1941/02/15  Today's TOC FU Call Status: Today's TOC FU Call Status:: Successful TOC FU Call Completed Unsuccessful Call (1st Attempt) Date: 06/17/23 [facility] FU Call Complete Date: 06/18/23  Transition Care Management Follow-up Telephone Call Date of Discharge: 06/16/23 Discharge Facility: Other ([facility]) Name of Other ([facility]) Discharge Facility: [facility] Type of Discharge: Inpatient Admission Primary Inpatient Discharge Diagnosis:: LLQ pain How have you been since you were released from the hospital?: Better Any questions or concerns?: No  Items Reviewed: Did you receive and understand the discharge instructions provided?: No Medications obtained,verified, and reconciled?: Yes (Medications Reviewed) Any new allergies since your discharge?: No Dietary orders reviewed?: Yes Do you have support at home?: Yes People in Home: spouse  Medications Reviewed Today: Medications Reviewed Today     Reviewed by Karena Addison, LPN (Licensed Practical Nurse) on 06/18/23 at 1449  Med List Status: <None>   Medication Order Taking? Sig Documenting Provider Last Dose Status Informant  acetaminophen (TYLENOL) 500 MG tablet [id number]  Take 1,000 mg by mouth every 6 (six) hours as needed for moderate pain. [provider]  Active Spouse/Significant Other  atorvastatin (LIPITOR) 20 MG tablet [id number]  TAKE 1 TABLET EVERY MORNING Rakes, Doralee Albino, FNP  Active   Carboxymethylcellul-Glycerin (CLEAR EYES FOR DRY EYES OP) [id number]  Place 1 drop into both eyes daily as needed (dry eyes). [provider]  Active Spouse/Significant Other  Choline Fenofibrate (FENOFIBRIC ACID) 135 MG CPDR [id number]  TAKE 1 CAPSULE DAILY Rakes, Doralee Albino, FNP  Active   donepezil (ARICEPT) 10 MG tablet [id number]  TAKE 1 TABLET IN THE       MORNING Rakes, Doralee Albino, FNP  Active   ELIQUIS 5 MG TABS tablet [id number]  TAKE 1 TABLET TWICE A DAY  Sonny Masters, FNP  Active   escitalopram (LEXAPRO) 20 MG tablet [id number]  TAKE 1 TABLET EVERY MORNING Rakes, Doralee Albino, FNP  Active   furosemide (LASIX) 20 MG tablet [id number]  TAKE 1 TABLET DAILY Rakes, Doralee Albino, FNP  Active   glucose blood test strip [id number]  EVERY DAY [provider]  Active Spouse/Significant Other  HYDROcodone-acetaminophen (NORCO/VICODIN) 5-325 MG tablet [id number]  Take 2 tablets by mouth every 4 (four) hours as needed. Eldred Manges, MD  Active Spouse/Significant Other  HYDROcodone-acetaminophen (NORCO/VICODIN) 5-325 MG tablet [id number]  Take 1 tablet by mouth every 6 (six) hours as needed for moderate pain. Eldred Manges, MD  Active   hydrocortisone cream 1 % [id number]  Apply to affected area 2 times daily  Patient taking differently: Apply 1 Application topically 2 (two) times daily as needed for itching.   Marita Kansas, PA-C  Active Spouse/Significant Other  Insulin Pen Needle 32G X 4 MM MISC [id number]  USE AS DIRECTED [provider]  Active Spouse/Significant Other  lisinopril (ZESTRIL) 5 MG tablet [id number]  TAKE 1 TABLET DAILY Sonny Masters, FNP  Active   omeprazole (PRILOSEC) 40 MG capsule [id number]  TAKE 1 CAPSULE DAILY Rakes, Doralee Albino, FNP  Active   ondansetron (ZOFRAN) 4 MG tablet [id number]  TAKE 1 TABLET BY MOUTH 3 TIMES DAILY BEFORE MEALS Rourk, Gerrit Friends, MD  Active Spouse/Significant Other  ondansetron (ZOFRAN-ODT) 4 MG disintegrating tablet [id number]  Take 1 tablet (4 mg total) by mouth every 8 (eight) hours as needed for nausea or vomiting. Marita Kansas, PA-C  Active Spouse/Significant Other  predniSONE (DELTASONE) 20 MG tablet [id number] Yes Take 20 mg  by mouth daily with breakfast. [provider]  Active   TRUEplus Lancets 28G MISC [id number]  Apply 1 each topically 3 (three) times daily. [provider]  Active Spouse/Significant Other  ULTICARE MICRO PEN NEEDLES 32G X 4 MM MISC [id number]   [provider]  Active  Spouse/Significant Other            Home Care and Equipment/Supplies: Were Home Health Services Ordered?: NA Any new equipment or medical supplies ordered?: NA  Functional Questionnaire: Do you need assistance with bathing/showering or dressing?: No Do you need assistance with meal preparation?: No Do you need assistance with eating?: No Do you have difficulty maintaining continence: No Do you need assistance with getting out of bed/getting out of a chair/moving?: No Do you have difficulty managing or taking your medications?: No  Follow up appointments reviewed: PCP Follow-up appointment confirmed?: Yes Date of PCP follow-up appointment?: 06/25/23 Follow-up Provider: [facility] Follow-up appointment confirmed?: Yes Date of Specialist follow-up appointment?: 06/23/23 Follow-Up Specialty Provider:: vascular Do you need transportation to your follow-up appointment?: No Do you understand care options if your condition(s) worsen?: Yes-patient verbalized understanding    SIGNATURE  Karena Addison, LPN [facility] Nurse Health Advisor Direct Dial [phone]

## 2023-06-25 ENCOUNTER — Encounter: Payer: Self-pay | Admitting: Family Medicine

## 2023-06-25 ENCOUNTER — Ambulatory Visit (INDEPENDENT_AMBULATORY_CARE_PROVIDER_SITE_OTHER): Payer: No Typology Code available for payment source | Admitting: Family Medicine

## 2023-06-25 VITALS — BP 129/58 | HR 66 | Temp 97.6°F | Ht 73.0 in | Wt 205.6 lb

## 2023-06-25 DIAGNOSIS — I776 Arteritis, unspecified: Secondary | ICD-10-CM

## 2023-06-25 DIAGNOSIS — R1032 Left lower quadrant pain: Secondary | ICD-10-CM | POA: Diagnosis not present

## 2023-06-25 DIAGNOSIS — E1159 Type 2 diabetes mellitus with other circulatory complications: Secondary | ICD-10-CM

## 2023-06-25 DIAGNOSIS — Z09 Encounter for follow-up examination after completed treatment for conditions other than malignant neoplasm: Secondary | ICD-10-CM | POA: Diagnosis not present

## 2023-06-25 DIAGNOSIS — I7143 Infrarenal abdominal aortic aneurysm, without rupture: Secondary | ICD-10-CM | POA: Diagnosis not present

## 2023-06-25 DIAGNOSIS — I152 Hypertension secondary to endocrine disorders: Secondary | ICD-10-CM

## 2023-06-25 DIAGNOSIS — D631 Anemia in chronic kidney disease: Secondary | ICD-10-CM

## 2023-06-25 DIAGNOSIS — N1832 Chronic kidney disease, stage 3b: Secondary | ICD-10-CM

## 2023-06-25 LAB — BAYER DCA HB A1C WAIVED: HB A1C (BAYER DCA - WAIVED): 5.3 % (ref 4.8–5.6)
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
## 2023-06-26 ENCOUNTER — Encounter: Payer: Self-pay | Admitting: Hematology

## 2023-06-27 ENCOUNTER — Encounter: Payer: Self-pay | Admitting: Hematology

## 2023-06-27 ENCOUNTER — Other Ambulatory Visit: Payer: Self-pay
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
## 2023-06-29 ENCOUNTER — Inpatient Hospital Stay: Payer: Medicare Other | Attending: Hematology

## 2023-06-29 ENCOUNTER — Inpatient Hospital Stay: Payer: Medicare Other

## 2023-06-29 ENCOUNTER — Inpatient Hospital Stay (HOSPITAL_BASED_OUTPATIENT_CLINIC_OR_DEPARTMENT_OTHER): Payer: Medicare Other | Admitting: Hematology

## 2023-06-29 ENCOUNTER — Telehealth: Payer: Self-pay | Admitting: *Deleted

## 2023-06-29 ENCOUNTER — Encounter: Payer: Self-pay | Admitting: Hematology

## 2023-06-29 DIAGNOSIS — Z79899 Other long term (current) drug therapy: Secondary | ICD-10-CM | POA: Diagnosis not present

## 2023-06-29 DIAGNOSIS — D469 Myelodysplastic syndrome, unspecified: Secondary | ICD-10-CM

## 2023-06-29 DIAGNOSIS — D500 Iron deficiency anemia secondary to blood loss (chronic): Secondary | ICD-10-CM

## 2023-06-29 DIAGNOSIS — Z86711 Personal history of pulmonary embolism: Secondary | ICD-10-CM | POA: Insufficient documentation

## 2023-06-29 DIAGNOSIS — D631 Anemia in chronic kidney disease: Secondary | ICD-10-CM | POA: Diagnosis present

## 2023-06-29 DIAGNOSIS — D539 Nutritional anemia, unspecified: Secondary | ICD-10-CM

## 2023-06-29 DIAGNOSIS — Z7901 Long term (current) use of anticoagulants: Secondary | ICD-10-CM | POA: Insufficient documentation

## 2023-06-29 DIAGNOSIS — N1832 Chronic kidney disease, stage 3b: Secondary | ICD-10-CM | POA: Diagnosis present

## 2023-06-29 DIAGNOSIS — K766 Portal hypertension: Secondary | ICD-10-CM | POA: Insufficient documentation

## 2023-06-29 LAB — ANEMIA PROFILE B
Basophils Absolute: 0 10*3/uL (ref 0.0–0.2)
Basos: 0 %
EOS (ABSOLUTE): 0 10*3/uL (ref 0.0–0.4)
Eos: 0 %
Ferritin: 605 ng/mL — ABNORMAL HIGH (ref 30–400)
Folate: 7.9 ng/mL (ref >3.0)
Hematocrit: 27.5 % — ABNORMAL LOW (ref 37.5–51.0)
Hemoglobin: 8.8 g/dL — CL (ref 13.0–17.7)
Immature Grans (Abs): 0.3 10*3/uL — ABNORMAL HIGH (ref 0.0–0.1)
Immature Granulocytes: 4 %
Iron Saturation: 34 % (ref 15–55)
Iron: 98 ug/dL (ref 38–169)
Lymphocytes Absolute: 1.3 10*3/uL (ref 0.7–3.1)
Lymphs: 15 %
MCH: 32.2 pg (ref 26.6–33.0)
MCHC: 32 g/dL (ref 31.5–35.7)
MCV: 101 fL — ABNORMAL HIGH (ref 79–97)
Monocytes Absolute: 2.5 10*3/uL — ABNORMAL HIGH (ref 0.1–0.9)
Monocytes: 30 %
Neutrophils Absolute: 4.3 10*3/uL (ref 1.4–7.0)
Neutrophils: 51 %
Platelets: 129 10*3/uL — ABNORMAL LOW (ref 150–450)
RBC: 2.73 x10E6/uL — CL (ref 4.14–5.80)
RDW: 19.6 % — ABNORMAL HIGH (ref 11.6–15.4)
Retic Ct Pct: 7.3 % — ABNORMAL HIGH (ref 0.6–2.6)
Total Iron Binding Capacity: 287 ug/dL (ref 250–450)
UIBC: 189 ug/dL (ref 111–343)
Vitamin B-12: 537 pg/mL (ref 232–1245)
WBC: 8.5 10*3/uL (ref 3.4–10.8)

## 2023-06-29 LAB — CMP14+EGFR
ALT: 8 IU/L (ref 0–44)
AST: 9 IU/L (ref 0–40)
Albumin: 3.6 g/dL — ABNORMAL LOW (ref 3.8–4.8)
Alkaline Phosphatase: 61 IU/L (ref 44–121)
BUN/Creatinine Ratio: 21 (ref 10–24)
BUN: 21 mg/dL (ref 8–27)
Bilirubin Total: 1.1 mg/dL (ref 0.0–1.2)
CO2: 23 mmol/L (ref 20–29)
Calcium: 8.2 mg/dL — ABNORMAL LOW (ref 8.6–10.2)
Chloride: 102 mmol/L (ref 96–106)
Creatinine, Ser: 1 mg/dL (ref 0.76–1.27)
Globulin, Total: 2.2 g/dL (ref 1.5–4.5)
Glucose: 100 mg/dL — ABNORMAL HIGH (ref 70–99)
Potassium: 3.6 mmol/L (ref 3.5–5.2)
Sodium: 139 mmol/L (ref 134–144)
Total Protein: 5.8 g/dL — ABNORMAL LOW (ref 6.0–8.5)
eGFR: 76 mL/min/{1.73_m2} (ref >59)

## 2023-06-29 LAB — CBC
HCT: 31.3 % — ABNORMAL LOW (ref 39.0–52.0)
Hemoglobin: 9.6 g/dL — ABNORMAL LOW (ref 13.0–17.0)
MCH: 32.3 pg (ref 26.0–34.0)
MCHC: 30.7 g/dL (ref 30.0–36.0)
MCV: 105.4 fL — ABNORMAL HIGH (ref 80.0–100.0)
Platelets: 150 10*3/uL (ref 150–400)
RBC: 2.97 MIL/uL — ABNORMAL LOW (ref 4.22–5.81)
RDW: 20.9 % — ABNORMAL HIGH (ref 11.5–15.5)
WBC: 10.8 10*3/uL — ABNORMAL HIGH (ref 4.0–10.5)
nRBC: 0 % (ref 0.0–0.2)

## 2023-06-29 LAB — SAMPLE TO BLOOD BANK

## 2023-06-29 LAB — THYROID PANEL WITH TSH
Free Thyroxine Index: 1.7 (ref 1.2–4.9)
T3 Uptake Ratio: 29 % (ref 24–39)
T4, Total: 5.8 ug/dL (ref 4.5–12.0)
TSH: 2.62 u[IU]/mL (ref 0.450–4.500)

## 2023-06-29 MED ORDER — LUSPATERCEPT-AAMT 75 MG ~~LOC~~ SOLR
1.0000 mg/kg | Freq: Once | SUBCUTANEOUS | Status: AC
  Administered 2023-06-29: 100 mg via SUBCUTANEOUS
  Filled 2023-06-29: qty 1.5

## 2023-06-29 NOTE — Telephone Encounter (Signed)
Hgb is stable for patient and improved from previous. Will route to PCP.

## 2023-06-29 NOTE — Telephone Encounter (Signed)
Critical Hgb of 8.8-will route to covering provider since PCP out.

## 2023-06-29 NOTE — Progress Notes (Signed)
Patient tolerated injection with no complaints voiced.  Site clean and dry with no bruising or swelling noted at site.  See MAR for details.  Band aid applied.  Patient stable during and after injection.  Vss with discharge and left in satisfactory condition with no s/s of distress noted.  

## 2023-06-29 NOTE — Progress Notes (Signed)
Patient presents today for Reblozyl injection per providers order.  Vital signs and labs reviewed by MD.  Message received from Anne Fu LPN/Dr. Ellin Saba patient okay for injection.

## 2023-06-29 NOTE — Patient Instructions (Addendum)
[facility]  Discharge Instructions  You were seen and examined today by Dr. Ellin Saba.  Dr. Ellin Saba discussed your most recent lab work and CT scan which revealed that everything looks stable.  Follow-up as scheduled in 9 weeks.    Thank you for choosing  [facility] to provide your oncology and hematology care.   To afford each patient quality time with our provider, please arrive at least 15 minutes before your scheduled appointment time. You may need to reschedule your appointment if you arrive late (10 or more minutes). Arriving late affects you and other patients whose appointments are after yours.  Also, if you miss three or more appointments without notifying the office, you may be dismissed from the clinic at the provider's discretion.    Again, thank you for choosing [facility].  Our hope is that these requests will decrease the amount of time that you wait before being seen by our physicians.   If you have a lab appointment with the Cancer Center - please note that after April 8th, all labs will be drawn in the cancer center.  You do not have to check in or register with the main entrance as you have in the past but will complete your check-in at the cancer center.            _____________________________________________________________  Should you have questions after your visit to [facility], please contact our office at [phone] and follow the prompts.  Our office hours are 8:00 a.m. to 4:30 p.m. Monday - Thursday and 8:00 a.m. to 2:30 p.m. Friday.  Please note that voicemails left after 4:00 p.m. may not be returned until the following business day.  We are closed weekends and all major holidays.  You do have access to a nurse 24-7, just call the main number to the clinic [phone] and do not press any options, hold on the line and a nurse will answer the phone.    For prescription  refill requests, have your pharmacy contact our office and allow 72 hours.    Masks are no longer required in the cancer centers. If you would like for your care team to wear a mask while they are taking care of you, please let them know. You may have one support person who is at least 81 years old accompany you for your appointments.

## 2023-06-29 NOTE — Progress Notes (Signed)
Patient has been assessed, vital signs and labs have been reviewed by Dr. Katragadda. ANC, Creatinine, LFTs, and Platelets are within treatment parameters per Dr. Katragadda. The patient is good to proceed with treatment at this time. Primary RN and pharmacy aware.  

## 2023-06-29 NOTE — Patient Instructions (Signed)

## 2023-06-30 NOTE — Telephone Encounter (Signed)
Pt's spouse aware of provider feedback and voiced understanding.

## 2023-07-21 ENCOUNTER — Inpatient Hospital Stay: Payer: Medicare Other | Attending: Hematology

## 2023-07-21 ENCOUNTER — Inpatient Hospital Stay: Payer: Medicare Other | Admitting: Hematology

## 2023-07-21 ENCOUNTER — Inpatient Hospital Stay: Payer: Medicare Other

## 2023-07-21 VITALS — BP 119/69 | HR 70 | Temp 98.2°F | Resp 18

## 2023-07-21 DIAGNOSIS — Z79899 Other long term (current) drug therapy: Secondary | ICD-10-CM | POA: Diagnosis not present

## 2023-07-21 DIAGNOSIS — N1832 Chronic kidney disease, stage 3b: Secondary | ICD-10-CM | POA: Insufficient documentation

## 2023-07-21 DIAGNOSIS — D469 Myelodysplastic syndrome, unspecified: Secondary | ICD-10-CM | POA: Diagnosis present

## 2023-07-21 DIAGNOSIS — D539 Nutritional anemia, unspecified: Secondary | ICD-10-CM

## 2023-07-21 DIAGNOSIS — D631 Anemia in chronic kidney disease: Secondary | ICD-10-CM | POA: Insufficient documentation

## 2023-07-21 DIAGNOSIS — D500 Iron deficiency anemia secondary to blood loss (chronic): Secondary | ICD-10-CM

## 2023-07-21 LAB — CBC
HCT: 31.6 % — ABNORMAL LOW (ref 39.0–52.0)
Hemoglobin: 9.7 g/dL — ABNORMAL LOW (ref 13.0–17.0)
MCH: 34.4 pg — ABNORMAL HIGH (ref 26.0–34.0)
MCHC: 30.7 g/dL (ref 30.0–36.0)
MCV: 112.1 fL — ABNORMAL HIGH (ref 80.0–100.0)
Platelets: 112 10*3/uL — ABNORMAL LOW (ref 150–400)
RBC: 2.82 MIL/uL — ABNORMAL LOW (ref 4.22–5.81)
RDW: 23.1 % — ABNORMAL HIGH (ref 11.5–15.5)
WBC: 10.8 10*3/uL — ABNORMAL HIGH (ref 4.0–10.5)
nRBC: 0 % (ref 0.0–0.2)

## 2023-07-21 LAB — SAMPLE TO BLOOD BANK

## 2023-07-21 MED ORDER — LUSPATERCEPT-AAMT 75 MG ~~LOC~~ SOLR
1.0000 mg/kg | Freq: Once | SUBCUTANEOUS | Status: AC
  Administered 2023-07-21: 100 mg via SUBCUTANEOUS
  Filled 2023-07-21: qty 1.5
  Filled 2023-07-21: qty 2

## 2023-07-21 NOTE — Progress Notes (Signed)
Patient tolerated injection with no complaints voiced.  Site clean and dry with no bruising or swelling noted at site.  See MAR for details.  Band aid applied.  Patient stable during and after injection.  Vss with discharge and left in satisfactory condition with no s/s of distress noted.  

## 2023-07-21 NOTE — Patient Instructions (Signed)
[facility]  Discharge Instructions: Thank you for choosing [facility] to provide your oncology and hematology care.  If you have a lab appointment with the Cancer Center - please note that after April 8th, 2024, all labs will be drawn in the cancer center.  You do not have to check in or register with the main entrance as you have in the past but will complete your check-in in the cancer center.  Wear comfortable clothing and clothing appropriate for easy access to any Portacath or PICC line.   We strive to give you quality time with your provider. You may need to reschedule your appointment if you arrive late (15 or more minutes).  Arriving late affects you and other patients whose appointments are after yours.  Also, if you miss three or more appointments without notifying the office, you may be dismissed from the clinic at the provider's discretion.      For prescription refill requests, have your pharmacy contact our office and allow 72 hours for refills to be completed.    Today you received the following chemotherapy and/or immunotherapy agents reblozyl      To help prevent nausea and vomiting after your treatment, we encourage you to take your nausea medication as directed.  BELOW ARE SYMPTOMS THAT SHOULD BE REPORTED IMMEDIATELY: *FEVER GREATER THAN 100.4 F (38 C) OR HIGHER *CHILLS OR SWEATING *NAUSEA AND VOMITING THAT IS NOT CONTROLLED WITH YOUR NAUSEA MEDICATION *UNUSUAL SHORTNESS OF BREATH *UNUSUAL BRUISING OR BLEEDING *URINARY PROBLEMS (pain or burning when urinating, or frequent urination) *BOWEL PROBLEMS (unusual diarrhea, constipation, pain near the anus) TENDERNESS IN MOUTH AND THROAT WITH OR WITHOUT PRESENCE OF ULCERS (sore throat, sores in mouth, or a toothache) UNUSUAL RASH, SWELLING OR PAIN  UNUSUAL VAGINAL DISCHARGE OR ITCHING   Items with * indicate a potential emergency and should be followed up as soon as possible or go to the  Emergency Department if any problems should occur.  Please show the CHEMOTHERAPY ALERT CARD or IMMUNOTHERAPY ALERT CARD at check-in to the Emergency Department and triage nurse.  Should you have questions after your visit or need to cancel or reschedule your appointment, please contact [facility] [phone]  and follow the prompts.  Office hours are 8:00 a.m. to 4:30 p.m. Monday - Friday. Please note that voicemails left after 4:00 p.m. may not be returned until the following business day.  We are closed weekends and major holidays. You have access to a nurse at all times for urgent questions. Please call the main number to the clinic [phone] and follow the prompts.  For any non-urgent questions, you may also contact your provider using MyChart. We now offer e-Visits for anyone 67 and older to request care online for non-urgent symptoms. For details visit [email or web address].   Also download the MyChart app! Go to the app store, search "MyChart", open the app, select [facility], and log in with your MyChart username and password.

## 2023-08-10 ENCOUNTER — Other Ambulatory Visit: Payer: No Typology Code available for payment source

## 2023-08-10 ENCOUNTER — Inpatient Hospital Stay: Payer: Medicare Other

## 2023-08-10 ENCOUNTER — Ambulatory Visit: Payer: No Typology Code available for payment source | Admitting: Hematology

## 2023-08-10 ENCOUNTER — Ambulatory Visit: Payer: No Typology Code available for payment source

## 2023-08-10 VITALS — BP 108/62 | HR 84 | Temp 98.2°F | Resp 18

## 2023-08-10 DIAGNOSIS — N1832 Chronic kidney disease, stage 3b: Secondary | ICD-10-CM

## 2023-08-10 DIAGNOSIS — D469 Myelodysplastic syndrome, unspecified: Secondary | ICD-10-CM | POA: Diagnosis not present

## 2023-08-10 DIAGNOSIS — D500 Iron deficiency anemia secondary to blood loss (chronic): Secondary | ICD-10-CM

## 2023-08-10 DIAGNOSIS — D539 Nutritional anemia, unspecified: Secondary | ICD-10-CM

## 2023-08-10 LAB — CBC WITH DIFFERENTIAL/PLATELET
Abs Immature Granulocytes: 0.4 10*3/uL — ABNORMAL HIGH (ref 0.00–0.07)
Basophils Absolute: 0 10*3/uL (ref 0.0–0.1)
Basophils Relative: 0 %
Eosinophils Absolute: 0.1 10*3/uL (ref 0.0–0.5)
Eosinophils Relative: 1 %
HCT: 28.3 % — ABNORMAL LOW (ref 39.0–52.0)
Hemoglobin: 8.6 g/dL — ABNORMAL LOW (ref 13.0–17.0)
Lymphocytes Relative: 25 %
Lymphs Abs: 2.1 10*3/uL (ref 0.7–4.0)
MCH: 34 pg (ref 26.0–34.0)
MCHC: 30.4 g/dL (ref 30.0–36.0)
MCV: 111.9 fL — ABNORMAL HIGH (ref 80.0–100.0)
Metamyelocytes Relative: 4 %
Monocytes Absolute: 2.2 10*3/uL — ABNORMAL HIGH (ref 0.1–1.0)
Monocytes Relative: 27 %
Myelocytes: 1 %
Neutro Abs: 3.4 10*3/uL (ref 1.7–7.7)
Neutrophils Relative %: 42 %
Platelets: 145 10*3/uL — ABNORMAL LOW (ref 150–400)
RBC: 2.53 MIL/uL — ABNORMAL LOW (ref 4.22–5.81)
RDW: 21.1 % — ABNORMAL HIGH (ref 11.5–15.5)
WBC: 8.2 10*3/uL (ref 4.0–10.5)
nRBC: 0 % (ref 0.0–0.2)

## 2023-08-10 LAB — SAMPLE TO BLOOD BANK

## 2023-08-10 MED ORDER — LUSPATERCEPT-AAMT 75 MG ~~LOC~~ SOLR
1.0000 mg/kg | Freq: Once | SUBCUTANEOUS | Status: AC
  Administered 2023-08-10: 100 mg via SUBCUTANEOUS
  Filled 2023-08-10: qty 2
  Filled 2023-08-10: qty 0.5

## 2023-08-10 NOTE — Patient Instructions (Signed)

## 2023-08-10 NOTE — Progress Notes (Signed)
Patient tolerated injection with no complaints voiced.  Site clean and dry with no bruising or swelling noted at site.  See MAR for details.  Band aid applied.  Patient stable during and after injection.  Vss with discharge and left in satisfactory condition with no s/s of distress noted.  

## 2023-08-31 ENCOUNTER — Inpatient Hospital Stay: Payer: Medicare Other | Attending: Hematology | Admitting: Hematology

## 2023-08-31 ENCOUNTER — Inpatient Hospital Stay: Payer: Medicare Other

## 2023-08-31 VITALS — BP 128/85 | HR 72 | Temp 98.6°F | Resp 18 | Ht 72.0 in | Wt 213.0 lb

## 2023-08-31 DIAGNOSIS — R161 Splenomegaly, not elsewhere classified: Secondary | ICD-10-CM | POA: Diagnosis not present

## 2023-08-31 DIAGNOSIS — D631 Anemia in chronic kidney disease: Secondary | ICD-10-CM | POA: Diagnosis present

## 2023-08-31 DIAGNOSIS — D469 Myelodysplastic syndrome, unspecified: Secondary | ICD-10-CM

## 2023-08-31 DIAGNOSIS — D649 Anemia, unspecified: Secondary | ICD-10-CM

## 2023-08-31 DIAGNOSIS — N1832 Chronic kidney disease, stage 3b: Secondary | ICD-10-CM | POA: Diagnosis present

## 2023-08-31 DIAGNOSIS — Z79899 Other long term (current) drug therapy: Secondary | ICD-10-CM | POA: Diagnosis not present

## 2023-08-31 DIAGNOSIS — D500 Iron deficiency anemia secondary to blood loss (chronic): Secondary | ICD-10-CM

## 2023-08-31 DIAGNOSIS — Z7901 Long term (current) use of anticoagulants: Secondary | ICD-10-CM | POA: Diagnosis not present

## 2023-08-31 DIAGNOSIS — I480 Paroxysmal atrial fibrillation: Secondary | ICD-10-CM | POA: Insufficient documentation

## 2023-08-31 DIAGNOSIS — Z87891 Personal history of nicotine dependence: Secondary | ICD-10-CM | POA: Insufficient documentation

## 2023-08-31 DIAGNOSIS — C9310 Chronic myelomonocytic leukemia not having achieved remission: Secondary | ICD-10-CM | POA: Insufficient documentation

## 2023-08-31 DIAGNOSIS — D539 Nutritional anemia, unspecified: Secondary | ICD-10-CM

## 2023-08-31 LAB — CBC WITH DIFFERENTIAL/PLATELET
Abs Immature Granulocytes: 0.33 10*3/uL — ABNORMAL HIGH (ref 0.00–0.07)
Basophils Absolute: 0 10*3/uL (ref 0.0–0.1)
Basophils Relative: 0 %
Eosinophils Absolute: 0.1 10*3/uL (ref 0.0–0.5)
Eosinophils Relative: 1 %
HCT: 26.4 % — ABNORMAL LOW (ref 39.0–52.0)
Hemoglobin: 7.8 g/dL — ABNORMAL LOW (ref 13.0–17.0)
Immature Granulocytes: 5 %
Lymphocytes Relative: 23 %
Lymphs Abs: 1.6 10*3/uL (ref 0.7–4.0)
MCH: 32.6 pg (ref 26.0–34.0)
MCHC: 29.5 g/dL — ABNORMAL LOW (ref 30.0–36.0)
MCV: 110.5 fL — ABNORMAL HIGH (ref 80.0–100.0)
Monocytes Absolute: 2.1 10*3/uL — ABNORMAL HIGH (ref 0.1–1.0)
Monocytes Relative: 30 %
Neutro Abs: 2.8 10*3/uL (ref 1.7–7.7)
Neutrophils Relative %: 41 %
Platelets: 146 10*3/uL — ABNORMAL LOW (ref 150–400)
RBC: 2.39 MIL/uL — ABNORMAL LOW (ref 4.22–5.81)
RDW: 19.8 % — ABNORMAL HIGH (ref 11.5–15.5)
WBC: 6.9 10*3/uL (ref 4.0–10.5)
nRBC: 0.3 % — ABNORMAL HIGH (ref 0.0–0.2)

## 2023-08-31 LAB — SAMPLE TO BLOOD BANK

## 2023-08-31 LAB — PREPARE RBC (CROSSMATCH)

## 2023-08-31 MED ORDER — LUSPATERCEPT-AAMT 75 MG ~~LOC~~ SOLR
150.0000 mg | Freq: Once | SUBCUTANEOUS | Status: AC
  Administered 2023-08-31: 150 mg via SUBCUTANEOUS
  Filled 2023-08-31: qty 3
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
## 2023-08-31 NOTE — Patient Instructions (Addendum)
[facility] at [facility] Discharge Instructions   You were seen and examined today by Dr. Ellin Saba.  He reviewed the results of your lab work. Your hemoglobin is 7.8 today.   We will arrange for you to have a unit of blood this week. Dr. Kirtland Bouchard is also increasing the dose of the shot you get every 3 weeks.   We will continue checking your lab work every 3 weeks with the injection.   We will see you back in 9 weeks.   Return as scheduled.    Thank you for choosing [facility] at [facility] to provide your oncology and hematology care.  To afford each patient quality time with our provider, please arrive at least 15 minutes before your scheduled appointment time.   If you have a lab appointment with the Cancer Center please come in thru the Main Entrance and check in at the main information desk.  You need to re-schedule your appointment should you arrive 10 or more minutes late.  We strive to give you quality time with our providers, and arriving late affects you and other patients whose appointments are after yours.  Also, if you no show three or more times for appointments you may be dismissed from the clinic at the providers discretion.     Again, thank you for choosing [facility].  Our hope is that these requests will decrease the amount of time that you wait before being seen by our physicians.       _____________________________________________________________  Should you have questions after your visit to [facility], please contact our office at [phone] and follow the prompts.  Our office hours are 8:00 a.m. and 4:30 p.m. Monday - Friday.  Please note that voicemails left after 4:00 p.m. may not be returned until the following business day.  We are closed weekends and major holidays.  You do have access to a nurse 24-7, just call the main number to the clinic [phone] and do not press any options, hold  on the line and a nurse will answer the phone.    For prescription refill requests, have your pharmacy contact our office and allow 72 hours.    Due to Covid, you will need to wear a mask upon entering the hospital. If you do not have a mask, a mask will be given to you at the Main Entrance upon arrival. For doctor visits, patients may have 1 support person age 42 or older with them. For treatment visits, patients can not have anyone with them due to social distancing guidelines and our immunocompromised population.

## 2023-08-31 NOTE — Progress Notes (Unsigned)
Give 150 mg Reblozyl today verses 135 mg (1.33 mg/kg) to prevent drug waste.  V.O. Dr Carilyn Goodpasture, PharmD

## 2023-08-31 NOTE — Progress Notes (Unsigned)
HGB 7.8. Message received from A.Anderson RN / Dr. Ellin Saba to give patient 1 unit of PRBC's. Scheduling, patient and Blood bank aware. Patient has antibodies . Appointment made for 13:00 pm 09-01-2023 for 1 unit of PRBC's.

## 2023-08-31 NOTE — Progress Notes (Unsigned)
Reblozyl 150 mg given today per MD orders. Tolerated infusion without adverse affects. Vital signs stable. No complaints at this time. Discharged from clinic via wheelchair in stable condition. Alert and oriented x 3. F/U with [facility] as scheduled.

## 2023-08-31 NOTE — Patient Instructions (Addendum)
[facility] AT [facility]  Discharge Instructions: Thank you for choosing [facility] to provide your oncology and hematology care.  If you have a lab appointment with the Cancer Center - please note that after April 8th, 2024, all labs will be drawn in the cancer center.  You do not have to check in or register with the main entrance as you have in the past but will complete your check-in in the cancer center.  Wear comfortable clothing and clothing appropriate for easy access to any Portacath or PICC line.   We strive to give you quality time with your provider. You may need to reschedule your appointment if you arrive late (15 or more minutes).  Arriving late affects you and other patients whose appointments are after yours.  Also, if you miss three or more appointments without notifying the office, you may be dismissed from the clinic at the provider's discretion.      For prescription refill requests, have your pharmacy contact our office and allow 72 hours for refills to be completed.    Today you received the following chemotherapy and/or immunotherapy agents Rebloyz 150mg      BELOW ARE SYMPTOMS THAT SHOULD BE REPORTED IMMEDIATELY: *FEVER GREATER THAN 100.4 F (38 C) OR HIGHER *CHILLS OR SWEATING *NAUSEA AND VOMITING THAT IS NOT CONTROLLED WITH YOUR NAUSEA MEDICATION *UNUSUAL SHORTNESS OF BREATH *UNUSUAL BRUISING OR BLEEDING *URINARY PROBLEMS (pain or burning when urinating, or frequent urination) *BOWEL PROBLEMS (unusual diarrhea, constipation, pain near the anus) TENDERNESS IN MOUTH AND THROAT WITH OR WITHOUT PRESENCE OF ULCERS (sore throat, sores in mouth, or a toothache) UNUSUAL RASH, SWELLING OR PAIN  UNUSUAL VAGINAL DISCHARGE OR ITCHING   Items with * indicate a potential emergency and should be followed up as soon as possible or go to the Emergency Department if any problems should occur.  Please show the CHEMOTHERAPY ALERT CARD or IMMUNOTHERAPY ALERT  CARD at check-in to the Emergency Department and triage nurse.  Should you have questions after your visit or need to cancel or reschedule your appointment, please contact [facility] CENTER AT [facility] [phone]  and follow the prompts.  Office hours are 8:00 a.m. to 4:30 p.m. Monday - Friday. Please note that voicemails left after 4:00 p.m. may not be returned until the following business day.  We are closed weekends and major holidays. You have access to a nurse at all times for urgent questions. Please call the main number to the clinic [phone] and follow the prompts.  For any non-urgent questions, you may also contact your provider using MyChart. We now offer e-Visits for anyone 68 and older to request care online for non-urgent symptoms. For details visit [email or web address].   Also download the MyChart app! Go to the app store, search "MyChart", open the app, select [place], and log in with your MyChart username and password.

## 2023-09-01 ENCOUNTER — Inpatient Hospital Stay: Payer: No Typology Code available for payment source

## 2023-09-01 ENCOUNTER — Other Ambulatory Visit: Payer: Self-pay

## 2023-09-01 ENCOUNTER — Encounter: Payer: Self-pay | Admitting: Hematology

## 2023-09-01 DIAGNOSIS — D469 Myelodysplastic syndrome, unspecified: Secondary | ICD-10-CM

## 2023-09-01 DIAGNOSIS — C9310 Chronic myelomonocytic leukemia not having achieved remission: Secondary | ICD-10-CM | POA: Diagnosis not present

## 2023-09-01 DIAGNOSIS — D649 Anemia, unspecified: Secondary | ICD-10-CM

## 2023-09-01 MED ORDER — SODIUM CHLORIDE 0.9% IV SOLUTION
250.0000 mL | INTRAVENOUS | Status: DC
  Administered 2023-09-01: 250 mL via INTRAVENOUS

## 2023-09-01 NOTE — Progress Notes (Signed)
Patient presents today for 1 unit of PRBC.  Patient is in satisfactory condition with no new complaints voiced.   Vital signs are stable.  IV placed in R arm.  IV flushed well with good blood return noted. Tylenol and Benadryl taken at 1230 prior to visit.  We will proceed with transfusion per provider orders.   Patient tolerated transfusion well with no complaints voiced.  Patient left via wheelchair with wife in stable condition.  Vital signs stable at discharge.  Follow up as scheduled.

## 2023-09-01 NOTE — Patient Instructions (Signed)

## 2023-09-02 LAB — TYPE AND SCREEN
ABO/RH(D): O POS
Antibody Screen: POSITIVE
PT AG Type: NEGATIVE
Unit division: 0

## 2023-09-02 LAB — BPAM RBC
Blood Product Expiration Date: 202411242359
ISSUE DATE / TIME: 202410221336
Unit Type and Rh: 5100

## 2023-09-03 ENCOUNTER — Encounter: Payer: Self-pay | Admitting: Orthopaedic Surgery

## 2023-09-03 ENCOUNTER — Ambulatory Visit (INDEPENDENT_AMBULATORY_CARE_PROVIDER_SITE_OTHER): Payer: No Typology Code available for payment source | Admitting: Orthopaedic Surgery

## 2023-09-03 DIAGNOSIS — M1711 Unilateral primary osteoarthritis, right knee: Secondary | ICD-10-CM

## 2023-09-03 DIAGNOSIS — I7143 Infrarenal abdominal aortic aneurysm, without rupture: Secondary | ICD-10-CM | POA: Diagnosis not present

## 2023-09-03 MED ORDER — HYDROCODONE-ACETAMINOPHEN 5-325 MG PO TABS: 2.0000 | ORAL_TABLET | ORAL | 0 refills | Status: DC | PRN
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
## 2023-09-06 ENCOUNTER — Other Ambulatory Visit: Payer: Self-pay | Admitting: Family Medicine

## 2023-09-21 ENCOUNTER — Encounter: Payer: Self-pay | Admitting: *Deleted

## 2023-09-21 ENCOUNTER — Inpatient Hospital Stay: Payer: Medicare Other | Attending: Hematology

## 2023-09-21 ENCOUNTER — Inpatient Hospital Stay: Payer: Medicare Other

## 2023-09-21 VITALS — BP 127/67 | HR 71 | Temp 97.8°F | Resp 18

## 2023-09-21 DIAGNOSIS — C9310 Chronic myelomonocytic leukemia not having achieved remission: Secondary | ICD-10-CM | POA: Insufficient documentation

## 2023-09-21 DIAGNOSIS — D469 Myelodysplastic syndrome, unspecified: Secondary | ICD-10-CM

## 2023-09-21 DIAGNOSIS — D631 Anemia in chronic kidney disease: Secondary | ICD-10-CM | POA: Diagnosis present

## 2023-09-21 DIAGNOSIS — N1832 Chronic kidney disease, stage 3b: Secondary | ICD-10-CM | POA: Diagnosis present

## 2023-09-21 DIAGNOSIS — D539 Nutritional anemia, unspecified: Secondary | ICD-10-CM

## 2023-09-21 DIAGNOSIS — Z79899 Other long term (current) drug therapy: Secondary | ICD-10-CM | POA: Insufficient documentation

## 2023-09-21 DIAGNOSIS — D500 Iron deficiency anemia secondary to blood loss (chronic): Secondary | ICD-10-CM

## 2023-09-21 LAB — CBC WITH DIFFERENTIAL/PLATELET
Abs Immature Granulocytes: 0.1 10*3/uL — ABNORMAL HIGH (ref 0.00–0.07)
Basophils Absolute: 0.1 10*3/uL (ref 0.0–0.1)
Basophils Relative: 1 %
Eosinophils Absolute: 0 10*3/uL (ref 0.0–0.5)
Eosinophils Relative: 0 %
HCT: 29.6 % — ABNORMAL LOW (ref 39.0–52.0)
Hemoglobin: 8.5 g/dL — ABNORMAL LOW (ref 13.0–17.0)
Lymphocytes Relative: 26 %
Lymphs Abs: 2 10*3/uL (ref 0.7–4.0)
MCH: 29.7 pg (ref 26.0–34.0)
MCHC: 28.7 g/dL — ABNORMAL LOW (ref 30.0–36.0)
MCV: 103.5 fL — ABNORMAL HIGH (ref 80.0–100.0)
Metamyelocytes Relative: 1 %
Monocytes Absolute: 1.8 10*3/uL — ABNORMAL HIGH (ref 0.1–1.0)
Monocytes Relative: 24 %
Neutro Abs: 3.6 10*3/uL (ref 1.7–7.7)
Neutrophils Relative %: 48 %
Platelets: 181 10*3/uL (ref 150–400)
RBC: 2.86 MIL/uL — ABNORMAL LOW (ref 4.22–5.81)
RDW: 19.1 % — ABNORMAL HIGH (ref 11.5–15.5)
WBC: 7.6 10*3/uL (ref 4.0–10.5)
nRBC: 0 % (ref 0.0–0.2)

## 2023-09-21 LAB — SAMPLE TO BLOOD BANK

## 2023-09-21 MED ORDER — LUSPATERCEPT-AAMT 75 MG ~~LOC~~ SOLR
150.0000 mg | Freq: Once | SUBCUTANEOUS | Status: AC
  Administered 2023-09-21: 150 mg via SUBCUTANEOUS
  Filled 2023-09-21 (×2): qty 3

## 2023-09-21 MED ORDER — ONDANSETRON 8 MG PO TBDP: 8.0000 mg | ORAL_TABLET | Freq: Three times a day (TID) | ORAL | 1 refills | Status: DC | PRN

## 2023-09-21 NOTE — Progress Notes (Signed)
Patient presents today for Reblozyl injection per providers order.  Vital signs within parameters for treatment.  HGB noted to be 8.5.  Patient has a fine red rash to neck,chest, and scalp.  MD aware.  Stable during administration without incident; injection site WNL; see MAR for injection details.  Patient tolerated procedure well and without incident.  No questions or complaints noted at this time.

## 2023-09-21 NOTE — Patient Instructions (Signed)
[facility] - A DEPT OF [facility]  Discharge Instructions: Thank you for choosing [facility] to provide your oncology and hematology care.  If you have a lab appointment with the Cancer Center - please note that after April 8th, 2024, all labs will be drawn in the cancer center.  You do not have to check in or register with the main entrance as you have in the past but will complete your check-in in the cancer center.  Wear comfortable clothing and clothing appropriate for easy access to any Portacath or PICC line.   We strive to give you quality time with your provider. You may need to reschedule your appointment if you arrive late (15 or more minutes).  Arriving late affects you and other patients whose appointments are after yours.  Also, if you miss three or more appointments without notifying the office, you may be dismissed from the clinic at the provider's discretion.      For prescription refill requests, have your pharmacy contact our office and allow 72 hours for refills to be completed.    Today you received the following chemotherapy and/or immunotherapy agents Reblozyl      To help prevent nausea and vomiting after your treatment, we encourage you to take your nausea medication as directed.  BELOW ARE SYMPTOMS THAT SHOULD BE REPORTED IMMEDIATELY: *FEVER GREATER THAN 100.4 F (38 C) OR HIGHER *CHILLS OR SWEATING *NAUSEA AND VOMITING THAT IS NOT CONTROLLED WITH YOUR NAUSEA MEDICATION *UNUSUAL SHORTNESS OF BREATH *UNUSUAL BRUISING OR BLEEDING *URINARY PROBLEMS (pain or burning when urinating, or frequent urination) *BOWEL PROBLEMS (unusual diarrhea, constipation, pain near the anus) TENDERNESS IN MOUTH AND THROAT WITH OR WITHOUT PRESENCE OF ULCERS (sore throat, sores in mouth, or a toothache) UNUSUAL RASH, SWELLING OR PAIN  UNUSUAL VAGINAL DISCHARGE OR ITCHING   Items with * indicate a potential emergency and should be followed up  as soon as possible or go to the Emergency Department if any problems should occur.  Please show the CHEMOTHERAPY ALERT CARD or IMMUNOTHERAPY ALERT CARD at check-in to the Emergency Department and triage nurse.  Should you have questions after your visit or need to cancel or reschedule your appointment, please contact [facility] - A DEPT OF [facility] [phone]  and follow the prompts.  Office hours are 8:00 a.m. to 4:30 p.m. Monday - Friday. Please note that voicemails left after 4:00 p.m. may not be returned until the following business day.  We are closed weekends and major holidays. You have access to a nurse at all times for urgent questions. Please call the main number to the clinic [phone] and follow the prompts.  For any non-urgent questions, you may also contact your provider using MyChart. We now offer e-Visits for anyone 72 and older to request care online for non-urgent symptoms. For details visit [email or web address].   Also download the MyChart app! Go to the app store, search "MyChart", open the app, select , and log in with your MyChart username and password.

## 2023-09-21 NOTE — Progress Notes (Signed)
Patient presents for lab work today and I was called to assess what he referred to as a rash on neck, chest and scalp.  Per my assessment, appears to be petechiae, rather than a rash.  Platelets 181 today and patient does not report any bleeding.  Is on Eliquis.  Per Dr. Ellin Saba, patient was advised to watch for now and report back to [place] if worsens.  No further recommendations at this time.

## 2023-09-25 ENCOUNTER — Encounter: Payer: Self-pay | Admitting: Family Medicine

## 2023-09-25 ENCOUNTER — Ambulatory Visit (INDEPENDENT_AMBULATORY_CARE_PROVIDER_SITE_OTHER): Payer: No Typology Code available for payment source | Admitting: Family Medicine

## 2023-09-25 VITALS — BP 123/66 | HR 74 | Temp 97.3°F | Ht 73.0 in | Wt 214.2 lb

## 2023-09-25 DIAGNOSIS — E119 Type 2 diabetes mellitus without complications: Secondary | ICD-10-CM | POA: Diagnosis not present

## 2023-09-25 DIAGNOSIS — E1159 Type 2 diabetes mellitus with other circulatory complications: Secondary | ICD-10-CM

## 2023-09-25 DIAGNOSIS — E785 Hyperlipidemia, unspecified: Secondary | ICD-10-CM

## 2023-09-25 DIAGNOSIS — E559 Vitamin D deficiency, unspecified: Secondary | ICD-10-CM

## 2023-09-25 DIAGNOSIS — F339 Major depressive disorder, recurrent, unspecified: Secondary | ICD-10-CM

## 2023-09-25 DIAGNOSIS — D469 Myelodysplastic syndrome, unspecified: Secondary | ICD-10-CM

## 2023-09-25 DIAGNOSIS — K5901 Slow transit constipation: Secondary | ICD-10-CM

## 2023-09-25 DIAGNOSIS — Z794 Long term (current) use of insulin: Secondary | ICD-10-CM

## 2023-09-25 DIAGNOSIS — I152 Hypertension secondary to endocrine disorders: Secondary | ICD-10-CM

## 2023-09-25 DIAGNOSIS — E1169 Type 2 diabetes mellitus with other specified complication: Secondary | ICD-10-CM

## 2023-09-25 DIAGNOSIS — R413 Other amnesia: Secondary | ICD-10-CM

## 2023-09-25 LAB — BAYER DCA HB A1C WAIVED: HB A1C (BAYER DCA - WAIVED): 4.9 % (ref 4.8–5.6)

## 2023-09-25 MED ORDER — ESCITALOPRAM OXALATE 20 MG PO TABS: 20.0000 mg | ORAL_TABLET | Freq: Every morning | ORAL | 1 refills | Status: DC

## 2023-09-25 MED ORDER — DONEPEZIL HCL 10 MG PO TABS: 10.0000 mg | ORAL_TABLET | Freq: Every morning | ORAL | 1 refills | Status: DC

## 2023-09-25 MED ORDER — LACTULOSE 10 GM/15ML PO SOLN: 30.0000 g | Freq: Every day | ORAL | 0 refills | Status: DC | PRN

## 2023-09-25 MED ORDER — FENOFIBRIC ACID 135 MG PO CPDR: 1.0000 | DELAYED_RELEASE_CAPSULE | Freq: Every day | ORAL | 1 refills | Status: DC

## 2023-09-25 MED ORDER — ATORVASTATIN CALCIUM 20 MG PO TABS: 20.0000 mg | ORAL_TABLET | Freq: Every morning | ORAL | 1 refills | Status: DC

## 2023-09-25 NOTE — Patient Instructions (Signed)
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
## 2023-09-26 ENCOUNTER — Encounter: Payer: Self-pay | Admitting: Hematology

## 2023-09-26 LAB — CMP14+EGFR
ALT: 3 [IU]/L (ref 0–44)
AST: 7 [IU]/L (ref 0–40)
Albumin: 4.2 g/dL (ref 3.8–4.8)
Alkaline Phosphatase: 66 [IU]/L (ref 44–121)
BUN/Creatinine Ratio: 10 (ref 10–24)
BUN: 11 mg/dL (ref 8–27)
Bilirubin Total: 1.1 mg/dL (ref 0.0–1.2)
CO2: 23 mmol/L (ref 20–29)
Calcium: 8.5 mg/dL — ABNORMAL LOW (ref 8.6–10.2)
Chloride: 102 mmol/L (ref 96–106)
Creatinine, Ser: 1.11 mg/dL (ref 0.76–1.27)
Globulin, Total: 3.1 g/dL (ref 1.5–4.5)
Glucose: 134 mg/dL — ABNORMAL HIGH (ref 70–99)
Potassium: 3.9 mmol/L (ref 3.5–5.2)
Sodium: 141 mmol/L (ref 134–144)
Total Protein: 7.3 g/dL (ref 6.0–8.5)
eGFR: 67 mL/min/{1.73_m2} (ref >59)

## 2023-09-26 LAB — LIPID PANEL
Chol/HDL Ratio: 2.9 ratio (ref 0.0–5.0)
Cholesterol, Total: 86 mg/dL — ABNORMAL LOW (ref 100–199)
HDL: 30 mg/dL — ABNORMAL LOW (ref >39)
LDL Chol Calc (NIH): 35 mg/dL (ref 0–99)
Triglycerides: 110 mg/dL (ref 0–149)
VLDL Cholesterol Cal: 21 mg/dL (ref 5–40)

## 2023-09-26 LAB — ANEMIA PROFILE B
Basophils Absolute: 0.2 10*3/uL (ref 0.0–0.2)
Basos: 2 %
EOS (ABSOLUTE): 0 10*3/uL (ref 0.0–0.4)
Eos: 0 %
Ferritin: 405 ng/mL — ABNORMAL HIGH (ref 30–400)
Folate: 4.3 ng/mL (ref >3.0)
Hematocrit: 31.4 % — ABNORMAL LOW (ref 37.5–51.0)
Hemoglobin: 9 g/dL — ABNORMAL LOW (ref 13.0–17.7)
Iron Saturation: 25 % (ref 15–55)
Iron: 94 ug/dL (ref 38–169)
Lymphocytes Absolute: 2.5 10*3/uL (ref 0.7–3.1)
Lymphs: 33 %
MCH: 29.3 pg (ref 26.6–33.0)
MCHC: 28.7 g/dL — ABNORMAL LOW (ref 31.5–35.7)
MCV: 102 fL — ABNORMAL HIGH (ref 79–97)
Monocytes Absolute: 1.9 10*3/uL — ABNORMAL HIGH (ref 0.1–0.9)
Monocytes: 25 %
NRBC: 1 % — ABNORMAL HIGH (ref 0–0)
Neutrophils Absolute: 2.5 10*3/uL (ref 1.4–7.0)
Neutrophils: 33 %
Platelets: 185 10*3/uL (ref 150–450)
RBC: 3.07 x10E6/uL — ABNORMAL LOW (ref 4.14–5.80)
RDW: 17 % — ABNORMAL HIGH (ref 11.6–15.4)
Retic Ct Pct: 8.3 % — ABNORMAL HIGH (ref 0.6–2.6)
Total Iron Binding Capacity: 380 ug/dL (ref 250–450)
UIBC: 286 ug/dL (ref 111–343)
Vitamin B-12: 605 pg/mL (ref 232–1245)
WBC: 7.6 10*3/uL (ref 3.4–10.8)

## 2023-09-26 LAB — THYROID PANEL WITH TSH
Free Thyroxine Index: 2.1 (ref 1.2–4.9)
T3 Uptake Ratio: 28 % (ref 24–39)
T4, Total: 7.6 ug/dL (ref 4.5–12.0)
TSH: 2.81 u[IU]/mL (ref 0.450–4.500)

## 2023-09-26 LAB — IMMATURE CELLS
MYELOCYTES: 3 % — ABNORMAL HIGH (ref 0–0)
Metamyelocytes: 4 % — ABNORMAL HIGH (ref 0–0)

## 2023-09-26 LAB — VITAMIN D 25 HYDROXY (VIT D DEFICIENCY, FRACTURES): Vit D, 25-Hydroxy: 17.5 ng/mL — ABNORMAL LOW (ref 30.0–100.0)

## 2023-09-27 ENCOUNTER — Other Ambulatory Visit: Payer: Self-pay

## 2023-10-01 ENCOUNTER — Telehealth (INDEPENDENT_AMBULATORY_CARE_PROVIDER_SITE_OTHER): Payer: Self-pay | Admitting: *Deleted

## 2023-10-01 NOTE — Telephone Encounter (Signed)
Patient's wfe left message - he needs apt please call [phone]

## 2023-10-06 ENCOUNTER — Telehealth: Payer: Self-pay | Admitting: Internal Medicine

## 2023-10-06 NOTE — Telephone Encounter (Signed)
Called patients wife back states patient has been seeing Dr Jena Gauss for about 2 yrs and hasn't seemed to be helping him.  Stated he doesn't want to go back to [facility].  Still having pain in his lower abdomen and constipation - didn't want to make an appointment.

## 2023-10-06 NOTE — Telephone Encounter (Signed)
na

## 2023-10-06 NOTE — Telephone Encounter (Signed)
Received a note to schedule pt for an appointment from the [facility]. Office... I called and left a message asking pt to return call to get scheduled

## 2023-10-08 ENCOUNTER — Emergency Department (HOSPITAL_COMMUNITY): Payer: Medicare Other

## 2023-10-08 ENCOUNTER — Other Ambulatory Visit: Payer: Self-pay

## 2023-10-08 ENCOUNTER — Emergency Department (HOSPITAL_COMMUNITY)
Admission: EM | Admit: 2023-10-08 | Discharge: 2023-10-08 | Disposition: A | Discharge status: Home / Self Care | Payer: Medicare Other | Attending: Emergency Medicine | Admitting: Emergency Medicine

## 2023-10-08 DIAGNOSIS — Z87442 Personal history of urinary calculi: Secondary | ICD-10-CM | POA: Insufficient documentation

## 2023-10-08 DIAGNOSIS — K5732 Diverticulitis of large intestine without perforation or abscess without bleeding: Secondary | ICD-10-CM | POA: Diagnosis not present

## 2023-10-08 DIAGNOSIS — E119 Type 2 diabetes mellitus without complications: Secondary | ICD-10-CM | POA: Diagnosis not present

## 2023-10-08 DIAGNOSIS — R1032 Left lower quadrant pain: Secondary | ICD-10-CM | POA: Diagnosis present

## 2023-10-08 DIAGNOSIS — Z794 Long term (current) use of insulin: Secondary | ICD-10-CM | POA: Diagnosis not present

## 2023-10-08 DIAGNOSIS — K5792 Diverticulitis of intestine, part unspecified, without perforation or abscess without bleeding: Secondary | ICD-10-CM

## 2023-10-08 LAB — COMPREHENSIVE METABOLIC PANEL
ALT: 6 U/L (ref 0–44)
AST: 11 U/L — ABNORMAL LOW (ref 15–41)
Albumin: 4 g/dL (ref 3.5–5.0)
Alkaline Phosphatase: 50 U/L (ref 38–126)
Anion gap: 11 (ref 5–15)
BUN: 12 mg/dL (ref 8–23)
CO2: 25 mmol/L (ref 22–32)
Calcium: 9.2 mg/dL (ref 8.9–10.3)
Chloride: 101 mmol/L (ref 98–111)
Creatinine, Ser: 0.91 mg/dL (ref 0.61–1.24)
GFR, Estimated: >60 mL/min (ref >60)
Glucose, Bld: 126 mg/dL — ABNORMAL HIGH (ref 70–99)
Potassium: 3.2 mmol/L — ABNORMAL LOW (ref 3.5–5.1)
Sodium: 137 mmol/L (ref 135–145)
Total Bilirubin: 1.7 mg/dL — ABNORMAL HIGH (ref <1.2)
Total Protein: 8.7 g/dL — ABNORMAL HIGH (ref 6.5–8.1)

## 2023-10-08 LAB — CBC
HCT: 32.1 % — ABNORMAL LOW (ref 39.0–52.0)
Hemoglobin: 9.5 g/dL — ABNORMAL LOW (ref 13.0–17.0)
MCH: 30 pg (ref 26.0–34.0)
MCHC: 29.6 g/dL — ABNORMAL LOW (ref 30.0–36.0)
MCV: 101.3 fL — ABNORMAL HIGH (ref 80.0–100.0)
Platelets: 165 10*3/uL (ref 150–400)
RBC: 3.17 MIL/uL — ABNORMAL LOW (ref 4.22–5.81)
RDW: 19.5 % — ABNORMAL HIGH (ref 11.5–15.5)
WBC: 11 10*3/uL — ABNORMAL HIGH (ref 4.0–10.5)
nRBC: 0 % (ref 0.0–0.2)

## 2023-10-08 LAB — URINALYSIS, ROUTINE W REFLEX MICROSCOPIC
Bilirubin Urine: NEGATIVE
Glucose, UA: NEGATIVE mg/dL
Ketones, ur: NEGATIVE mg/dL
Leukocytes,Ua: NEGATIVE
Nitrite: NEGATIVE
Protein, ur: 30 mg/dL — AB
RBC / HPF: >50 RBC/hpf (ref 0–5)
Specific Gravity, Urine: 1.009 (ref 1.005–1.030)
pH: 5 (ref 5.0–8.0)

## 2023-10-08 LAB — POC OCCULT BLOOD, ED: Fecal Occult Bld: NEGATIVE

## 2023-10-08 LAB — LIPASE, BLOOD: Lipase: 41 U/L (ref 11–51)

## 2023-10-08 MED ORDER — DOCUSATE SODIUM 100 MG PO CAPS: 100.0000 mg | ORAL_CAPSULE | Freq: Two times a day (BID) | ORAL | 0 refills | Status: DC

## 2023-10-08 MED ORDER — OXYCODONE-ACETAMINOPHEN 5-325 MG PO TABS: 1.0000 | ORAL_TABLET | ORAL | 0 refills | Status: DC | PRN

## 2023-10-08 MED ORDER — IOHEXOL 300 MG/ML  SOLN
100.0000 mL | Freq: Once | INTRAMUSCULAR | Status: AC | PRN
  Administered 2023-10-08: 100 mL via INTRAVENOUS

## 2023-10-08 MED ORDER — AMOXICILLIN-POT CLAVULANATE 875-125 MG PO TABS
1.0000 | ORAL_TABLET | Freq: Once | ORAL | Status: AC
  Administered 2023-10-08: 1 via ORAL
  Filled 2023-10-08: qty 1

## 2023-10-08 MED ORDER — ONDANSETRON HCL 4 MG PO TABS: 4.0000 mg | ORAL_TABLET | Freq: Four times a day (QID) | ORAL | 0 refills | Status: DC

## 2023-10-08 MED ORDER — OXYCODONE-ACETAMINOPHEN 5-325 MG PO TABS: 1.0000 | ORAL_TABLET | Freq: Four times a day (QID) | ORAL | 0 refills | Status: DC | PRN

## 2023-10-08 MED ORDER — SODIUM CHLORIDE 0.9 % IV BOLUS
1000.0000 mL | Freq: Once | INTRAVENOUS | Status: AC
  Administered 2023-10-08: 1000 mL via INTRAVENOUS

## 2023-10-08 MED ORDER — ONDANSETRON HCL 4 MG/2ML IJ SOLN
4.0000 mg | Freq: Once | INTRAMUSCULAR | Status: AC
  Administered 2023-10-08: 4 mg via INTRAVENOUS
  Filled 2023-10-08: qty 2

## 2023-10-08 MED ORDER — POTASSIUM CHLORIDE CRYS ER 20 MEQ PO TBCR
40.0000 meq | EXTENDED_RELEASE_TABLET | Freq: Once | ORAL | Status: AC
  Administered 2023-10-08: 40 meq via ORAL
  Filled 2023-10-08: qty 2

## 2023-10-08 MED ORDER — ONDANSETRON HCL 4 MG PO TABS: 4.0000 mg | ORAL_TABLET | Freq: Three times a day (TID) | ORAL | 0 refills | Status: DC | PRN

## 2023-10-08 MED ORDER — AMOXICILLIN-POT CLAVULANATE 875-125 MG PO TABS: 1.0000 | ORAL_TABLET | Freq: Two times a day (BID) | ORAL | 0 refills | Status: DC

## 2023-10-08 MED ORDER — MORPHINE SULFATE (PF) 4 MG/ML IV SOLN
4.0000 mg | Freq: Once | INTRAVENOUS | Status: AC
  Administered 2023-10-08: 4 mg via INTRAVENOUS
  Filled 2023-10-08: qty 1

## 2023-10-08 NOTE — ED Provider Notes (Signed)
[place] EMERGENCY DEPARTMENT AT [facility] Provider Note   CSN: [id number] Arrival date & time: 10/08/23  1149     History  Chief Complaint  Patient presents with   Abdominal Pain    David Duarte is a 82 y.o. male with a history including liver cirrhosis, history of abdominal aortic aneurysm with surgical repair, history of PE on Eliquis, kidney stones, type 2 diabetes, history of diverticulosis along with 1 prior episode of diverticulitis presenting for evaluation of left lower abdominal pain.  He states approximately a week ago he started having mild discomfort in his left lower abdomen, worse with moving, walking and positional changes.  His pain has intensified and has now developed nausea with some dry heaves over the past 24 hours.  He denies fevers, he has had a poor appetite over the past 24 hours.  He also states his last bowel movement was 2 days ago and was small caliber stool but hard.  Since then he has had some rectal pressure and discomfort and is concerned about possible constipation.  He  does have pain that radiates across to his lower abdomen to the right of his umbilicus.  He denies dysuria, hematuria, no flank pain.  He has taken a Dulcolax suppository without any bowel movement or improvement in symptoms.  The history is provided by the patient and the spouse.       Home Medications Prior to Admission medications   Medication Sig Start Date End Date Taking? Authorizing Provider  amoxicillin-clavulanate (AUGMENTIN) 875-125 MG tablet Take 1 tablet by mouth every 12 (twelve) hours. 10/08/23  Yes Henning Ehle, Raynelle Fanning, PA-C  docusate sodium (COLACE) 100 MG capsule Take 1 capsule (100 mg total) by mouth every 12 (twelve) hours. 10/08/23  Yes Mahari Strahm, Raynelle Fanning, PA-C  ondansetron (ZOFRAN) 4 MG tablet Take 1 tablet (4 mg total) by mouth every 8 (eight) hours as needed for nausea or vomiting. 10/08/23  Yes Mehul Rudin, Raynelle Fanning, PA-C  ondansetron (ZOFRAN) 4 MG tablet Take 1 tablet  (4 mg total) by mouth every 6 (six) hours. 10/08/23  Yes Blondie Riggsbee, Raynelle Fanning, PA-C  oxyCODONE-acetaminophen (PERCOCET/ROXICET) 5-325 MG tablet Take 1 tablet by mouth every 6 (six) hours as needed for severe pain (pain score 7-10). 10/08/23  Yes Ezmeralda Stefanick, Raynelle Fanning, PA-C  oxyCODONE-acetaminophen (PERCOCET/ROXICET) 5-325 MG tablet Take 1 tablet by mouth every 4 (four) hours as needed. 10/08/23  Yes Makilah Dowda, Raynelle Fanning, PA-C  acetaminophen (TYLENOL) 500 MG tablet Take 1,000 mg by mouth every 6 (six) hours as needed for moderate pain.    [provider]  atorvastatin (LIPITOR) 20 MG tablet Take 1 tablet (20 mg total) by mouth every morning. 09/25/23   Sonny Masters, FNP  Carboxymethylcellul-Glycerin (CLEAR EYES FOR DRY EYES OP) Place 1 drop into both eyes daily as needed (dry eyes).    [provider]  Choline Fenofibrate (FENOFIBRIC ACID) 135 MG CPDR Take 1 capsule by mouth daily. 09/25/23   Sonny Masters, FNP  donepezil (ARICEPT) 10 MG tablet Take 1 tablet (10 mg total) by mouth every morning. 09/25/23   Sonny Masters, FNP  ELIQUIS 5 MG TABS tablet TAKE 1 TABLET TWICE A DAY 09/07/23   Sonny Masters, FNP  escitalopram (LEXAPRO) 20 MG tablet Take 1 tablet (20 mg total) by mouth every morning. 09/25/23   Sonny Masters, FNP  furosemide (LASIX) 20 MG tablet TAKE 1 TABLET DAILY 05/27/23   Sonny Masters, FNP  glucose blood test strip EVERY DAY 04/22/18  [provider]  HYDROcodone-acetaminophen (NORCO/VICODIN) 5-325 MG tablet Take 1 tablet by mouth every 6 (six) hours as needed for moderate pain. 05/04/23   Eldred Manges, MD  hydrocortisone cream 1 % Apply to affected area 2 times daily Patient taking differently: Apply 1 Application topically 2 (two) times daily as needed for itching. 03/05/23   Marita Kansas, PA-C  Insulin Pen Needle 32G X 4 MM MISC USE AS DIRECTED 01/20/18   [provider]  lactulose (CHRONULAC) 10 GM/15ML solution Take 45 mLs (30 g total) by mouth daily as needed for mild  constipation. 09/25/23   Sonny Masters, FNP  lisinopril (ZESTRIL) 5 MG tablet TAKE 1 TABLET DAILY 05/27/23   Sonny Masters, FNP  omeprazole (PRILOSEC) 40 MG capsule TAKE 1 CAPSULE DAILY 09/07/23   Sonny Masters, FNP  ondansetron (ZOFRAN-ODT) 8 MG disintegrating tablet Take 1 tablet (8 mg total) by mouth every 8 (eight) hours as needed for nausea or vomiting. 09/21/23   Doreatha Massed, MD  TRUEplus Lancets 28G MISC Apply 1 each topically 3 (three) times daily. 10/04/22   [provider]  Stann Ore MICRO PEN NEEDLES 32G X 4 MM MISC  05/03/18   [provider]      Allergies    Eliquis [apixaban]    Review of Systems   Review of Systems  Constitutional:  Negative for fever.  HENT: Negative.  Negative for congestion and sore throat.   Eyes: Negative.   Respiratory:  Negative for chest tightness and shortness of breath.   Cardiovascular:  Negative for chest pain.  Gastrointestinal:  Positive for abdominal pain, constipation and nausea. Negative for blood in stool and vomiting.  Genitourinary: Negative.  Negative for dysuria and flank pain.  Musculoskeletal:  Negative for arthralgias, joint swelling and neck pain.  Skin: Negative.  Negative for rash and wound.  Neurological:  Negative for dizziness, weakness, light-headedness, numbness and headaches.  Psychiatric/Behavioral: Negative.    All other systems reviewed and are negative.   Physical Exam Updated Vital Signs BP 133/66   Pulse 76   Temp 98 F (36.7 C) (Oral)   Resp 17   Ht 6\' 1"  (1.854 m)   Wt 87.1 kg   SpO2 98%   BMI 25.33 kg/m  Physical Exam Vitals and nursing note reviewed. Exam conducted with a chaperone present.  Constitutional:      Appearance: He is well-developed.  HENT:     Head: Normocephalic and atraumatic.  Eyes:     Conjunctiva/sclera: Conjunctivae normal.  Cardiovascular:     Rate and Rhythm: Normal rate and regular rhythm.     Heart sounds: Normal heart sounds.  Pulmonary:      Effort: Pulmonary effort is normal.     Breath sounds: Normal breath sounds. No wheezing.  Abdominal:     General: Abdomen is protuberant. Bowel sounds are normal.     Palpations: Abdomen is soft. There is no pulsatile mass.     Tenderness: There is abdominal tenderness in the suprapubic area and left lower quadrant. There is guarding. There is no rebound.     Hernia: No hernia is present.  Genitourinary:    Rectum: Normal. Guaiac result negative. No mass.     Comments: No impaction, no stool in rectal vault. Musculoskeletal:        General: Normal range of motion.     Cervical back: Normal range of motion.  Skin:    General: Skin is warm and dry.  Neurological:  General: No focal deficit present.     Mental Status: He is alert.     ED Results / Procedures / Treatments   Labs (all labs ordered are listed, but only abnormal results are displayed) Labs Reviewed  COMPREHENSIVE METABOLIC PANEL - Abnormal; Notable for the following components:      Result Value   Potassium 3.2 (*)    Glucose, Bld 126 (*)    Total Protein 8.7 (*)    AST 11 (*)    Total Bilirubin 1.7 (*)    All other components within normal limits  CBC - Abnormal; Notable for the following components:   WBC 11.0 (*)    RBC 3.17 (*)    Hemoglobin 9.5 (*)    HCT 32.1 (*)    MCV 101.3 (*)    MCHC 29.6 (*)    RDW 19.5 (*)    All other components within normal limits  URINALYSIS, ROUTINE W REFLEX MICROSCOPIC - Abnormal; Notable for the following components:   Hgb urine dipstick LARGE (*)    Protein, ur 30 (*)    Bacteria, UA RARE (*)    All other components within normal limits  LIPASE, BLOOD  POC OCCULT BLOOD, ED    EKG None  Radiology CT ABDOMEN PELVIS W CONTRAST  Result Date: 10/08/2023 CLINICAL DATA:  Left lower quadrant abdominal pain.  [place]. EXAM: CT ABDOMEN AND PELVIS WITH CONTRAST TECHNIQUE: Multidetector CT imaging of the abdomen and pelvis was performed using the standard protocol  following bolus administration of intravenous contrast. RADIATION DOSE REDUCTION: This exam was performed according to the departmental dose-optimization program which includes automated exposure control, adjustment of the mA and/or kV according to patient size and/or use of iterative reconstruction technique. CONTRAST:  OMNIPAQUE IOHEXOL 300 MG/ML  SOLN COMPARISON:  03/05/2023 FINDINGS: Lower chest: Coronary and aortic atheromatous vascular calcification. Small pericardial effusion. 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 Spleen: Unremarkable Adrenals/Urinary Tract: 3.1 cm right kidney lower pole Bosniak category 1 exophytic cyst posteriorly on image 45 series 2. No further imaging workup of this lesion is indicated. Small benign left renal cyst. No further imaging workup of this lesion is indicated. Adrenal glands unremarkable. Stomach/Bowel: Substantial sigmoid diverticulosis with proximal sigmoid acute diverticulitis, inflamed sigmoid diverticulum on image 65 series 2 with a moderate amount of surrounding inflammatory edema in the mesentery and tracking along the left retroperitoneum. No extraluminal gas or abscess. Normal appendix. Vascular/Lymphatic: Interval coiling of the prior endoleak adjacent to the aorta bi-iliac graft. No obvious current endoleak although today's exam was not performed as a CT angiogram and may lack sensitivity in detecting complications related to the graft. As before, the graft traverses the large native thrombosed aneurysm including common iliac aneurysms. Where not obscured by streak artifact from the coils, the graft lumens appear patent. Atherosclerosis is present, including aortoiliac atherosclerotic disease. No pathologic adenopathy. Minimal retroperitoneal stranding around the aneurysm although this is been present on prior exams as well. Reproductive: Prostatomegaly.  Other: Nonspecific presacral edema. Musculoskeletal: Multilevel lumbar degenerative disc disease. Mild degenerative hip arthropathy bilaterally. IMPRESSION: 1. Acute proximal sigmoid diverticulitis, without extraluminal gas or abscess. 2. Interval endovascular coiling of the prior type 2 endoleak adjacent to the aorta bi-iliac graft. No obvious current endoleak although today's exam was not performed as a CT angiogram and may lack sensitivity in detecting complications related to the graft. 3. Small pericardial effusion. 4. Pancreas divisum. Punctate calcifications in the pancreas compatible  with chronic calcific pancreatitis. 5. Prostatomegaly. 6. Multilevel lumbar degenerative disc disease. 7. Mild degenerative hip arthropathy bilaterally. 8. Aortic atherosclerosis. Aortic Atherosclerosis (ICD10-I70.0). Electronically Signed   By: Gaylyn Rong M.D.   On: 10/08/2023 15:10    Procedures Procedures    Medications Ordered in ED Medications  potassium chloride SA (KLOR-CON M) CR tablet 40 mEq (has no administration in time range)  sodium chloride 0.9 % bolus 1,000 mL (1,000 mLs Intravenous New Bag/Given 10/08/23 1427)  ondansetron (ZOFRAN) injection 4 mg (4 mg Intravenous Given 10/08/23 1428)  morphine (PF) 4 MG/ML injection 4 mg (4 mg Intravenous Given by Other 10/08/23 1428)  iohexol (OMNIPAQUE) 300 MG/ML solution 100 mL (100 mLs Intravenous Contrast Given 10/08/23 1438)  amoxicillin-clavulanate (AUGMENTIN) 875-125 MG per tablet 1 tablet (1 tablet Oral Given 10/08/23 1548)    ED Course/ Medical Decision Making/ A&P                                 Medical Decision Making Patient with left lower abdominal pain along with perceived constipation, last bowel movement was 2 days ago.  Differential diagnosis certainly includes constipation but also could be diverticulitis as he has had this problem in the past, this could also represent a UTI, small bowel obstruction, mass.  Amount and/or  Complexity of Data Reviewed Labs: ordered.    Details: Labs are significant for potassium at 3.2, he was given oral supplementation for this.  His LFTs are normal, lipase is normal he does have a white blood cell count of 11.0.  He also has a large amount of hemoglobin in his urine, he has no urinary complaints, rare bacteria nitrite negative.  Discussed this finding with him and his wife per phone after his discharge home explaining he needs to have a repeat urinalysis in a week or 2 to make sure this blood has resolved and if it persists his primary doctor can advise him further, they understand and agree with this plan. Radiology: ordered.    Details: CT reviewed, positive for acute diverticulitis without abscess or free air. Discussion of management or test interpretation with external provider(s): Patient was given Augmentin here will be prescribed additional Augmentin for home, also prescribed pain and nausea medicine, Colace to avoid constipation.  We discussed clear liquid diet for the next 1 to 2 days with slow increase in diet if symptoms allow.  He was also strongly cautioned about worsening symptoms and need for reevaluation either here or with his PCP if he has persistent or worsening symptoms.  He and wife understand and agree with this plan.  He felt significantly improved at the time of his discharge.  Risk Prescription drug management.           Final Clinical Impression(s) / ED Diagnoses Final diagnoses:  Diverticulitis    Rx / DC Orders ED Discharge Orders          Ordered    amoxicillin-clavulanate (AUGMENTIN) 875-125 MG tablet  Every 12 hours        10/08/23 1600    oxyCODONE-acetaminophen (PERCOCET/ROXICET) 5-325 MG tablet  Every 6 hours PRN        10/08/23 1602    ondansetron (ZOFRAN) 4 MG tablet  Every 8 hours PRN        10/08/23 1602    ondansetron (ZOFRAN) 4 MG tablet  Every 6 hours        10/08/23 1603  oxyCODONE-acetaminophen (PERCOCET/ROXICET) 5-325  MG tablet  Every 4 hours PRN        10/08/23 1603    docusate sodium (COLACE) 100 MG capsule  Every 12 hours        10/08/23 1605              Burgess Amor, PA-C 10/08/23 1858    Vanetta Mulders, MD 10/17/23 1539

## 2023-10-08 NOTE — ED Triage Notes (Signed)
Pt to er, pt states that he is here for some abd pain, states that he has been feeling poorly for the past two weeks, pt is pale.  Pt states that he having a hard time having a bm.

## 2023-10-08 NOTE — ED Notes (Signed)
Placed in a gown and placed on cardiac monitoring

## 2023-10-08 NOTE — Discharge Instructions (Signed)
Take your next dose of the antibiotic prescribed tomorrow morning, you will need the get this picked up from your pharmacy tomorrow morning.  Additionally I have given you samples of nausea medicine and pain medicine to help you with your symptoms today until you can get your prescriptions filled tomorrow.  Do not drive within 4 hours of taking Percocet and use caution with this medication as it will make you drowsy.  It can also cause constipation which we do not want, therefore I am also prescribing you Colace to help keep your stool soft.  Maintain a clear liquid diet as we discussed for the least the next 24 hours, if your symptoms are improving you can slowly increase your diet to more soft but solid food.  If you develop worse pain, fever, uncontrolled vomiting or any new symptoms return here immediately for a recheck and consideration for hospital admission.

## 2023-10-09 MED FILL — Ondansetron HCl Tab 4 MG: ORAL | Qty: 4 | Status: AC

## 2023-10-09 MED FILL — Oxycodone w/ Acetaminophen Tab 5-325 MG: ORAL | Qty: 6 | Status: AC

## 2023-10-12 ENCOUNTER — Other Ambulatory Visit: Payer: Self-pay | Admitting: Hematology

## 2023-10-12 ENCOUNTER — Inpatient Hospital Stay: Payer: Medicare Other | Attending: Hematology

## 2023-10-12 ENCOUNTER — Inpatient Hospital Stay: Payer: Medicare Other

## 2023-10-12 VITALS — BP 101/68 | HR 90 | Temp 97.8°F | Resp 18

## 2023-10-12 DIAGNOSIS — D631 Anemia in chronic kidney disease: Secondary | ICD-10-CM | POA: Insufficient documentation

## 2023-10-12 DIAGNOSIS — C9310 Chronic myelomonocytic leukemia not having achieved remission: Secondary | ICD-10-CM | POA: Insufficient documentation

## 2023-10-12 DIAGNOSIS — N1832 Chronic kidney disease, stage 3b: Secondary | ICD-10-CM | POA: Insufficient documentation

## 2023-10-12 DIAGNOSIS — Z79899 Other long term (current) drug therapy: Secondary | ICD-10-CM | POA: Insufficient documentation

## 2023-10-12 DIAGNOSIS — D469 Myelodysplastic syndrome, unspecified: Secondary | ICD-10-CM

## 2023-10-12 DIAGNOSIS — D539 Nutritional anemia, unspecified: Secondary | ICD-10-CM

## 2023-10-12 DIAGNOSIS — D500 Iron deficiency anemia secondary to blood loss (chronic): Secondary | ICD-10-CM

## 2023-10-12 LAB — CBC WITH DIFFERENTIAL/PLATELET
Abs Immature Granulocytes: 0.35 10*3/uL — ABNORMAL HIGH (ref 0.00–0.07)
Basophils Absolute: 0 10*3/uL (ref 0.0–0.1)
Basophils Relative: 0 %
Eosinophils Absolute: 0.1 10*3/uL (ref 0.0–0.5)
Eosinophils Relative: 1 %
HCT: 29.9 % — ABNORMAL LOW (ref 39.0–52.0)
Hemoglobin: 8.6 g/dL — ABNORMAL LOW (ref 13.0–17.0)
Immature Granulocytes: 4 %
Lymphocytes Relative: 8 %
Lymphs Abs: 0.7 10*3/uL (ref 0.7–4.0)
MCH: 29.3 pg (ref 26.0–34.0)
MCHC: 28.8 g/dL — ABNORMAL LOW (ref 30.0–36.0)
MCV: 101.7 fL — ABNORMAL HIGH (ref 80.0–100.0)
Monocytes Absolute: 2.9 10*3/uL — ABNORMAL HIGH (ref 0.1–1.0)
Monocytes Relative: 33 %
Neutro Abs: 4.9 10*3/uL (ref 1.7–7.7)
Neutrophils Relative %: 54 %
Platelets: 176 10*3/uL (ref 150–400)
RBC: 2.94 MIL/uL — ABNORMAL LOW (ref 4.22–5.81)
RDW: 19.6 % — ABNORMAL HIGH (ref 11.5–15.5)
WBC: 9 10*3/uL (ref 4.0–10.5)
nRBC: 0 % (ref 0.0–0.2)

## 2023-10-12 LAB — SAMPLE TO BLOOD BANK

## 2023-10-12 MED ORDER — LUSPATERCEPT-AAMT 75 MG ~~LOC~~ SOLR
150.0000 mg | Freq: Once | SUBCUTANEOUS | Status: AC
Start: 2023-10-12 — End: 2023-10-12
  Administered 2023-10-12: 150 mg via SUBCUTANEOUS
  Filled 2023-10-12 (×2): qty 3

## 2023-10-12 NOTE — Progress Notes (Signed)
Patient presents today fro Reblozyl injection pre providers order.  Vital signs WNL.  Hgb noted to be 8.6.  No new complaints at this time.  Stable during administration without incident; injection site WNL; see MAR for injection details.  Patient tolerated procedure well and without incident.  No questions or complaints noted at this time.

## 2023-10-12 NOTE — Progress Notes (Signed)
Maintain dose of Reblozyl at 150 mg.  T.O. Dr Carilyn Goodpasture, PharmD

## 2023-10-12 NOTE — Patient Instructions (Signed)
[facility] [place] - A DEPT OF [facility]  Discharge Instructions: Thank you for choosing [facility] to provide your oncology and hematology care.  If you have a lab appointment with the Cancer Center - please note that after April 8th, 2024, all labs will be drawn in the cancer center.  You do not have to check in or register with the main entrance as you have in the past but will complete your check-in in the cancer center.  Wear comfortable clothing and clothing appropriate for easy access to any Portacath or PICC line.   We strive to give you quality time with your provider. You may need to reschedule your appointment if you arrive late (15 or more minutes).  Arriving late affects you and other patients whose appointments are after yours.  Also, if you miss three or more appointments without notifying the office, you may be dismissed from the clinic at the provider's discretion.      For prescription refill requests, have your pharmacy contact our office and allow 72 hours for refills to be completed.    Today you received the following chemotherapy and/or immunotherapy agents Reblozyl      To help prevent nausea and vomiting after your treatment, we encourage you to take your nausea medication as directed.  BELOW ARE SYMPTOMS THAT SHOULD BE REPORTED IMMEDIATELY: *FEVER GREATER THAN 100.4 F (38 C) OR HIGHER *CHILLS OR SWEATING *NAUSEA AND VOMITING THAT IS NOT CONTROLLED WITH YOUR NAUSEA MEDICATION *UNUSUAL SHORTNESS OF BREATH *UNUSUAL BRUISING OR BLEEDING *URINARY PROBLEMS (pain or burning when urinating, or frequent urination) *BOWEL PROBLEMS (unusual diarrhea, constipation, pain near the anus) TENDERNESS IN MOUTH AND THROAT WITH OR WITHOUT PRESENCE OF ULCERS (sore throat, sores in mouth, or a toothache) UNUSUAL RASH, SWELLING OR PAIN  UNUSUAL VAGINAL DISCHARGE OR ITCHING   Items with * indicate a potential emergency and should be followed up  as soon as possible or go to the Emergency Department if any problems should occur.  Please show the CHEMOTHERAPY ALERT CARD or IMMUNOTHERAPY ALERT CARD at check-in to the Emergency Department and triage nurse.  Should you have questions after your visit or need to cancel or reschedule your appointment, please contact [facility] [place] - A DEPT OF [facility] [phone]  and follow the prompts.  Office hours are 8:00 a.m. to 4:30 p.m. Monday - Friday. Please note that voicemails left after 4:00 p.m. may not be returned until the following business day.  We are closed weekends and major holidays. You have access to a nurse at all times for urgent questions. Please call the main number to the clinic [phone] and follow the prompts.  For any non-urgent questions, you may also contact your provider using MyChart. We now offer e-Visits for anyone 55 and older to request care online for non-urgent symptoms. For details visit [email or web address].   Also download the MyChart app! Go to the app store, search "MyChart", open the app, select [place], and log in with your MyChart username and password.

## 2023-10-22 ENCOUNTER — Other Ambulatory Visit: Payer: Self-pay

## 2023-10-22 ENCOUNTER — Encounter (HOSPITAL_COMMUNITY): Payer: Self-pay | Admitting: Family Medicine

## 2023-10-22 ENCOUNTER — Emergency Department (HOSPITAL_COMMUNITY): Payer: Medicare Other

## 2023-10-22 ENCOUNTER — Inpatient Hospital Stay (HOSPITAL_COMMUNITY)
Admission: EM | Admit: 2023-10-22 | Discharge: 2023-10-26 | DRG: 392 | Disposition: A | Discharge status: Home / Self Care | Payer: Medicare Other | Attending: Family Medicine | Admitting: Family Medicine

## 2023-10-22 DIAGNOSIS — E7800 Pure hypercholesterolemia, unspecified: Secondary | ICD-10-CM | POA: Diagnosis present

## 2023-10-22 DIAGNOSIS — E1169 Type 2 diabetes mellitus with other specified complication: Secondary | ICD-10-CM | POA: Diagnosis present

## 2023-10-22 DIAGNOSIS — K5732 Diverticulitis of large intestine without perforation or abscess without bleeding: Secondary | ICD-10-CM | POA: Diagnosis present

## 2023-10-22 DIAGNOSIS — D630 Anemia in neoplastic disease: Secondary | ICD-10-CM | POA: Diagnosis present

## 2023-10-22 DIAGNOSIS — Z833 Family history of diabetes mellitus: Secondary | ICD-10-CM

## 2023-10-22 DIAGNOSIS — F0393 Unspecified dementia, unspecified severity, with mood disturbance: Secondary | ICD-10-CM | POA: Diagnosis present

## 2023-10-22 DIAGNOSIS — I152 Hypertension secondary to endocrine disorders: Secondary | ICD-10-CM | POA: Diagnosis present

## 2023-10-22 DIAGNOSIS — G8929 Other chronic pain: Secondary | ICD-10-CM | POA: Diagnosis present

## 2023-10-22 DIAGNOSIS — I480 Paroxysmal atrial fibrillation: Secondary | ICD-10-CM | POA: Diagnosis present

## 2023-10-22 DIAGNOSIS — D469 Myelodysplastic syndrome, unspecified: Secondary | ICD-10-CM | POA: Diagnosis not present

## 2023-10-22 DIAGNOSIS — Z860100 Personal history of colon polyps, unspecified: Secondary | ICD-10-CM

## 2023-10-22 DIAGNOSIS — Z888 Allergy status to other drugs, medicaments and biological substances status: Secondary | ICD-10-CM

## 2023-10-22 DIAGNOSIS — K5909 Other constipation: Secondary | ICD-10-CM | POA: Diagnosis present

## 2023-10-22 DIAGNOSIS — Z86711 Personal history of pulmonary embolism: Secondary | ICD-10-CM

## 2023-10-22 DIAGNOSIS — I776 Arteritis, unspecified: Secondary | ICD-10-CM | POA: Diagnosis present

## 2023-10-22 DIAGNOSIS — Z79899 Other long term (current) drug therapy: Secondary | ICD-10-CM

## 2023-10-22 DIAGNOSIS — I7143 Infrarenal abdominal aortic aneurysm, without rupture: Secondary | ICD-10-CM | POA: Diagnosis present

## 2023-10-22 DIAGNOSIS — K7460 Unspecified cirrhosis of liver: Secondary | ICD-10-CM | POA: Diagnosis present

## 2023-10-22 DIAGNOSIS — K5792 Diverticulitis of intestine, part unspecified, without perforation or abscess without bleeding: Principal | ICD-10-CM

## 2023-10-22 DIAGNOSIS — F32A Depression, unspecified: Secondary | ICD-10-CM | POA: Diagnosis present

## 2023-10-22 DIAGNOSIS — R162 Hepatomegaly with splenomegaly, not elsewhere classified: Secondary | ICD-10-CM | POA: Diagnosis present

## 2023-10-22 DIAGNOSIS — R630 Anorexia: Secondary | ICD-10-CM | POA: Diagnosis present

## 2023-10-22 DIAGNOSIS — D849 Immunodeficiency, unspecified: Secondary | ICD-10-CM | POA: Diagnosis present

## 2023-10-22 DIAGNOSIS — Z794 Long term (current) use of insulin: Secondary | ICD-10-CM | POA: Diagnosis not present

## 2023-10-22 DIAGNOSIS — Z6828 Body mass index (BMI) 28.0-28.9, adult: Secondary | ICD-10-CM

## 2023-10-22 DIAGNOSIS — C9310 Chronic myelomonocytic leukemia not having achieved remission: Secondary | ICD-10-CM | POA: Diagnosis present

## 2023-10-22 DIAGNOSIS — K219 Gastro-esophageal reflux disease without esophagitis: Secondary | ICD-10-CM | POA: Diagnosis present

## 2023-10-22 DIAGNOSIS — D500 Iron deficiency anemia secondary to blood loss (chronic): Secondary | ICD-10-CM | POA: Diagnosis present

## 2023-10-22 DIAGNOSIS — D649 Anemia, unspecified: Secondary | ICD-10-CM | POA: Diagnosis not present

## 2023-10-22 DIAGNOSIS — Z7901 Long term (current) use of anticoagulants: Secondary | ICD-10-CM

## 2023-10-22 DIAGNOSIS — E876 Hypokalemia: Secondary | ICD-10-CM | POA: Diagnosis not present

## 2023-10-22 DIAGNOSIS — R195 Other fecal abnormalities: Secondary | ICD-10-CM | POA: Diagnosis not present

## 2023-10-22 DIAGNOSIS — Z87891 Personal history of nicotine dependence: Secondary | ICD-10-CM | POA: Diagnosis not present

## 2023-10-22 DIAGNOSIS — K7469 Other cirrhosis of liver: Secondary | ICD-10-CM | POA: Diagnosis not present

## 2023-10-22 DIAGNOSIS — Z8679 Personal history of other diseases of the circulatory system: Secondary | ICD-10-CM

## 2023-10-22 DIAGNOSIS — Z79891 Long term (current) use of opiate analgesic: Secondary | ICD-10-CM

## 2023-10-22 DIAGNOSIS — E1159 Type 2 diabetes mellitus with other circulatory complications: Secondary | ICD-10-CM | POA: Diagnosis present

## 2023-10-22 DIAGNOSIS — Z87442 Personal history of urinary calculi: Secondary | ICD-10-CM

## 2023-10-22 LAB — CBC WITH DIFFERENTIAL/PLATELET
Abs Immature Granulocytes: 0.3 10*3/uL — ABNORMAL HIGH (ref 0.00–0.07)
Band Neutrophils: 1 %
Basophils Absolute: 0 10*3/uL (ref 0.0–0.1)
Basophils Relative: 0 %
Eosinophils Absolute: 0 10*3/uL (ref 0.0–0.5)
Eosinophils Relative: 0 %
HCT: 27 % — ABNORMAL LOW (ref 39.0–52.0)
Hemoglobin: 7.9 g/dL — ABNORMAL LOW (ref 13.0–17.0)
Lymphocytes Relative: 8 %
Lymphs Abs: 0.7 10*3/uL (ref 0.7–4.0)
MCH: 29.9 pg (ref 26.0–34.0)
MCHC: 29.3 g/dL — ABNORMAL LOW (ref 30.0–36.0)
MCV: 102.3 fL — ABNORMAL HIGH (ref 80.0–100.0)
Metamyelocytes Relative: 2 %
Monocytes Absolute: 2.4 10*3/uL — ABNORMAL HIGH (ref 0.1–1.0)
Monocytes Relative: 27 %
Myelocytes: 1 %
Neutro Abs: 5.5 10*3/uL (ref 1.7–7.7)
Neutrophils Relative %: 61 %
Platelets: 164 10*3/uL (ref 150–400)
RBC: 2.64 MIL/uL — ABNORMAL LOW (ref 4.22–5.81)
RDW: 20.4 % — ABNORMAL HIGH (ref 11.5–15.5)
WBC: 8.9 10*3/uL (ref 4.0–10.5)
nRBC: 0.2 % (ref 0.0–0.2)

## 2023-10-22 LAB — COMPREHENSIVE METABOLIC PANEL
ALT: 6 U/L (ref 0–44)
AST: 11 U/L — ABNORMAL LOW (ref 15–41)
Albumin: 3.4 g/dL — ABNORMAL LOW (ref 3.5–5.0)
Alkaline Phosphatase: 42 U/L (ref 38–126)
Anion gap: 10 (ref 5–15)
BUN: 13 mg/dL (ref 8–23)
CO2: 25 mmol/L (ref 22–32)
Calcium: 8.6 mg/dL — ABNORMAL LOW (ref 8.9–10.3)
Chloride: 101 mmol/L (ref 98–111)
Creatinine, Ser: 0.95 mg/dL (ref 0.61–1.24)
GFR, Estimated: >60 mL/min (ref >60)
Glucose, Bld: 172 mg/dL — ABNORMAL HIGH (ref 70–99)
Potassium: 3.3 mmol/L — ABNORMAL LOW (ref 3.5–5.1)
Sodium: 136 mmol/L (ref 135–145)
Total Bilirubin: 1.2 mg/dL — ABNORMAL HIGH (ref <1.2)
Total Protein: 7.4 g/dL (ref 6.5–8.1)

## 2023-10-22 LAB — URINALYSIS, W/ REFLEX TO CULTURE (INFECTION SUSPECTED)
Bacteria, UA: NONE SEEN
Bilirubin Urine: NEGATIVE
Glucose, UA: NEGATIVE mg/dL
Ketones, ur: NEGATIVE mg/dL
Leukocytes,Ua: NEGATIVE
Nitrite: NEGATIVE
Protein, ur: 30 mg/dL — AB
Specific Gravity, Urine: 1.015 (ref 1.005–1.030)
pH: 5 (ref 5.0–8.0)

## 2023-10-22 LAB — GLUCOSE, CAPILLARY
Glucose-Capillary: 93 mg/dL (ref 70–99)
Glucose-Capillary: 161 mg/dL — ABNORMAL HIGH (ref 70–99)

## 2023-10-22 LAB — MAGNESIUM: Magnesium: 1.9 mg/dL (ref 1.7–2.4)

## 2023-10-22 LAB — LIPASE, BLOOD: Lipase: 28 U/L (ref 11–51)

## 2023-10-22 LAB — HEMOGLOBIN A1C
Hgb A1c MFr Bld: 4.3 % — ABNORMAL LOW (ref 4.8–5.6)
Mean Plasma Glucose: 76.71 mg/dL

## 2023-10-22 MED ORDER — FENTANYL CITRATE PF 50 MCG/ML IJ SOSY
50.0000 ug | PREFILLED_SYRINGE | Freq: Once | INTRAMUSCULAR | Status: AC
Start: 2023-10-22 — End: 2023-10-22
  Administered 2023-10-22: 50 ug via INTRAVENOUS
  Filled 2023-10-22: qty 1

## 2023-10-22 MED ORDER — FUROSEMIDE 20 MG PO TABS
20.0000 mg | ORAL_TABLET | Freq: Every day | ORAL | Status: DC
  Administered 2023-10-23 – 2023-10-26 (×4): 20 mg via ORAL
  Filled 2023-10-22 (×4): qty 1

## 2023-10-22 MED ORDER — SODIUM CHLORIDE 0.9% FLUSH
3.0000 mL | Freq: Two times a day (BID) | INTRAVENOUS | Status: DC
  Administered 2023-10-23 – 2023-10-26 (×6): 3 mL via INTRAVENOUS

## 2023-10-22 MED ORDER — MORPHINE SULFATE (PF) 4 MG/ML IV SOLN
4.0000 mg | Freq: Once | INTRAVENOUS | Status: AC
  Administered 2023-10-22: 4 mg via INTRAVENOUS
  Filled 2023-10-22: qty 1

## 2023-10-22 MED ORDER — ESCITALOPRAM OXALATE 10 MG PO TABS
20.0000 mg | ORAL_TABLET | Freq: Every morning | ORAL | Status: DC
  Administered 2023-10-23 – 2023-10-26 (×4): 20 mg via ORAL
  Filled 2023-10-22 (×4): qty 2

## 2023-10-22 MED ORDER — SENNOSIDES-DOCUSATE SODIUM 8.6-50 MG PO TABS
2.0000 | ORAL_TABLET | Freq: Every day | ORAL | Status: DC
  Administered 2023-10-22 – 2023-10-25 (×4): 2 via ORAL
  Filled 2023-10-22 (×4): qty 2

## 2023-10-22 MED ORDER — SODIUM CHLORIDE 0.9% FLUSH: 3.0000 mL | INTRAVENOUS | Status: DC | PRN

## 2023-10-22 MED ORDER — POLYETHYLENE GLYCOL 3350 17 G PO PACK
17.0000 g | PACK | Freq: Two times a day (BID) | ORAL | Status: DC
  Administered 2023-10-22 – 2023-10-26 (×7): 17 g via ORAL
  Filled 2023-10-22 (×8): qty 1

## 2023-10-22 MED ORDER — CARVEDILOL 3.125 MG PO TABS
3.1250 mg | ORAL_TABLET | Freq: Two times a day (BID) | ORAL | Status: DC
  Administered 2023-10-22 – 2023-10-26 (×8): 3.125 mg via ORAL
  Filled 2023-10-22 (×8): qty 1

## 2023-10-22 MED ORDER — APIXABAN 5 MG PO TABS: 5.0000 mg | ORAL_TABLET | Freq: Two times a day (BID) | ORAL | Status: DC

## 2023-10-22 MED ORDER — POTASSIUM CHLORIDE 10 MEQ/100ML IV SOLN
10.0000 meq | INTRAVENOUS | Status: AC
  Administered 2023-10-22 (×4): 10 meq via INTRAVENOUS
  Filled 2023-10-22: qty 100

## 2023-10-22 MED ORDER — DONEPEZIL HCL 5 MG PO TABS
10.0000 mg | ORAL_TABLET | Freq: Every morning | ORAL | Status: DC
  Administered 2023-10-23 – 2023-10-26 (×4): 10 mg via ORAL
  Filled 2023-10-22 (×4): qty 2

## 2023-10-22 MED ORDER — HYDROMORPHONE HCL 1 MG/ML IJ SOLN
0.5000 mg | INTRAMUSCULAR | Status: DC | PRN
  Administered 2023-10-22 – 2023-10-25 (×3): 0.5 mg via INTRAVENOUS
  Filled 2023-10-22 (×4): qty 0.5

## 2023-10-22 MED ORDER — ONDANSETRON HCL 4 MG/2ML IJ SOLN
4.0000 mg | Freq: Four times a day (QID) | INTRAMUSCULAR | Status: DC | PRN
  Administered 2023-10-25: 4 mg via INTRAVENOUS
  Filled 2023-10-22: qty 2

## 2023-10-22 MED ORDER — LISINOPRIL 5 MG PO TABS
5.0000 mg | ORAL_TABLET | Freq: Every day | ORAL | Status: DC
  Administered 2023-10-22 – 2023-10-26 (×5): 5 mg via ORAL
  Filled 2023-10-22 (×5): qty 1

## 2023-10-22 MED ORDER — SODIUM CHLORIDE 0.9 % IV SOLN
2.0000 g | INTRAVENOUS | Status: DC
  Administered 2023-10-23 – 2023-10-25 (×3): 2 g via INTRAVENOUS
  Filled 2023-10-22 (×3): qty 20

## 2023-10-22 MED ORDER — INSULIN ASPART 100 UNIT/ML IJ SOLN: 0.0000 [IU] | Freq: Every day | INTRAMUSCULAR | Status: DC

## 2023-10-22 MED ORDER — INSULIN ASPART 100 UNIT/ML IJ SOLN: 0.0000 [IU] | Freq: Three times a day (TID) | INTRAMUSCULAR | Status: DC

## 2023-10-22 MED ORDER — METRONIDAZOLE 500 MG/100ML IV SOLN
500.0000 mg | Freq: Two times a day (BID) | INTRAVENOUS | Status: DC
  Administered 2023-10-22 – 2023-10-25 (×6): 500 mg via INTRAVENOUS
  Filled 2023-10-22 (×6): qty 100

## 2023-10-22 MED ORDER — BISACODYL 10 MG RE SUPP: 10.0000 mg | Freq: Every day | RECTAL | Status: DC | PRN

## 2023-10-22 MED ORDER — OXYCODONE HCL 5 MG PO TABS
5.0000 mg | ORAL_TABLET | ORAL | Status: DC | PRN
  Administered 2023-10-22 – 2023-10-23 (×3): 5 mg via ORAL
  Filled 2023-10-22 (×3): qty 1

## 2023-10-22 MED ORDER — MORPHINE SULFATE (PF) 4 MG/ML IV SOLN
4.0000 mg | Freq: Once | INTRAVENOUS | Status: AC
Start: 2023-10-22 — End: 2023-10-22
  Administered 2023-10-22: 4 mg via INTRAVENOUS
  Filled 2023-10-22: qty 1

## 2023-10-22 MED ORDER — ATORVASTATIN CALCIUM 20 MG PO TABS
20.0000 mg | ORAL_TABLET | Freq: Every morning | ORAL | Status: DC
  Administered 2023-10-23 – 2023-10-26 (×4): 20 mg via ORAL
  Filled 2023-10-22 (×4): qty 1

## 2023-10-22 MED ORDER — PANTOPRAZOLE SODIUM 40 MG PO TBEC
40.0000 mg | DELAYED_RELEASE_TABLET | Freq: Every day | ORAL | Status: DC
  Administered 2023-10-23 – 2023-10-26 (×4): 40 mg via ORAL
  Filled 2023-10-22 (×4): qty 1

## 2023-10-22 MED ORDER — IOHEXOL 350 MG/ML SOLN
100.0000 mL | Freq: Once | INTRAVENOUS | Status: AC | PRN
  Administered 2023-10-22: 100 mL via INTRAVENOUS

## 2023-10-22 MED ORDER — METRONIDAZOLE 500 MG/100ML IV SOLN
500.0000 mg | Freq: Once | INTRAVENOUS | Status: AC
  Administered 2023-10-22: 500 mg via INTRAVENOUS
  Filled 2023-10-22: qty 100

## 2023-10-22 MED ORDER — ONDANSETRON HCL 4 MG/2ML IJ SOLN
4.0000 mg | Freq: Once | INTRAMUSCULAR | Status: AC
  Administered 2023-10-22: 4 mg via INTRAVENOUS
  Filled 2023-10-22: qty 2

## 2023-10-22 MED ORDER — TRAZODONE HCL 50 MG PO TABS: 50.0000 mg | ORAL_TABLET | Freq: Every evening | ORAL | Status: DC | PRN

## 2023-10-22 MED ORDER — SODIUM CHLORIDE 0.9% FLUSH
3.0000 mL | Freq: Two times a day (BID) | INTRAVENOUS | Status: DC
  Administered 2023-10-23 – 2023-10-25 (×2): 3 mL via INTRAVENOUS

## 2023-10-22 MED ORDER — FENOFIBRATE 160 MG PO TABS
160.0000 mg | ORAL_TABLET | Freq: Every day | ORAL | Status: DC
  Administered 2023-10-23 – 2023-10-26 (×4): 160 mg via ORAL
  Filled 2023-10-22 (×4): qty 1

## 2023-10-22 MED ORDER — ACETAMINOPHEN 650 MG RE SUPP: 650.0000 mg | Freq: Four times a day (QID) | RECTAL | Status: DC | PRN

## 2023-10-22 MED ORDER — ONDANSETRON HCL 4 MG PO TABS: 4.0000 mg | ORAL_TABLET | Freq: Four times a day (QID) | ORAL | Status: DC | PRN

## 2023-10-22 MED ORDER — SODIUM CHLORIDE 0.9 % IV SOLN: INTRAVENOUS | Status: AC | PRN

## 2023-10-22 MED ORDER — POLYETHYLENE GLYCOL 3350 17 G PO PACK: 17.0000 g | PACK | Freq: Every day | ORAL | Status: DC | PRN

## 2023-10-22 MED ORDER — SODIUM CHLORIDE 0.9 % IV BOLUS
500.0000 mL | Freq: Once | INTRAVENOUS | Status: AC
  Administered 2023-10-22: 500 mL via INTRAVENOUS

## 2023-10-22 MED ORDER — ACETAMINOPHEN 325 MG PO TABS
650.0000 mg | ORAL_TABLET | Freq: Four times a day (QID) | ORAL | Status: DC | PRN
Start: 2023-10-22 — End: 2023-10-26
  Administered 2023-10-23: 650 mg via ORAL
  Filled 2023-10-22: qty 2

## 2023-10-22 MED ORDER — ENSURE ENLIVE PO LIQD
237.0000 mL | Freq: Two times a day (BID) | ORAL | Status: DC
  Administered 2023-10-23 (×2): 237 mL via ORAL

## 2023-10-22 MED ORDER — SODIUM CHLORIDE 0.9 % IV SOLN
2.0000 g | Freq: Once | INTRAVENOUS | Status: AC
  Administered 2023-10-22: 2 g via INTRAVENOUS
  Filled 2023-10-22: qty 20

## 2023-10-22 NOTE — ED Triage Notes (Signed)
Pt c/o lower abd pain that has progressively gotten worse the past couple of days, states pain goes all the way across, rates pain 10/10. N/V, hasn't been able to eat for 3 days.
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
## 2023-10-22 NOTE — ED Provider Notes (Signed)
[place] [facility]MENT AT [facility] Provider Note   CSN: [id number] Arrival date & time: 10/22/23  3244     History  Chief Complaint  Patient presents with   Abdominal Pain    David Duarte is a 82 y.o. male.  He is here with a complaint of continued lower abdominal pain associated with nausea and vomiting, constipation.  He was seen about a week ago for same, diagnosed with diverticulitis and treated with Augmentin and pain medication.  He said his pain is no better.  This sounds fairly longstanding, said it has been going on over a year.  Follows with Dr. Jena Gauss from GI.  Said he has been losing weight, cannot lay on his abdomen cannot get any relief of the pain.  Also has a history of MDS CMML and follows with Dr. Ellin Saba.  History of aortitis and endoleak repair.  The history is provided by the patient.  Abdominal Pain Pain location:  RLQ, LLQ and suprapubic Pain quality: aching   Pain severity:  Severe Onset quality:  Gradual Duration:  1 week Timing:  Constant Progression:  Unchanged Chronicity:  Recurrent Relieved by:  Nothing Worsened by:  Nothing Associated symptoms: anorexia, constipation, nausea and vomiting   Associated symptoms: no chest pain, no cough, no diarrhea, no dysuria, no fever and no hematuria        Home Medications Prior to Admission medications   Medication Sig Start Date End Date Taking? Authorizing Provider  acetaminophen (TYLENOL) 500 MG tablet Take 1,000 mg by mouth every 6 (six) hours as needed for moderate pain.    [provider]  amoxicillin-clavulanate (AUGMENTIN) 875-125 MG tablet Take 1 tablet by mouth every 12 (twelve) hours. 10/08/23   Burgess Amor, PA-C  atorvastatin (LIPITOR) 20 MG tablet Take 1 tablet (20 mg total) by mouth every morning. 09/25/23   Sonny Masters, FNP  Carboxymethylcellul-Glycerin (CLEAR EYES FOR DRY EYES OP) Place 1 drop into both eyes daily as needed (dry eyes).    [provider]  Choline Fenofibrate (FENOFIBRIC ACID) 135 MG CPDR Take 1 capsule by mouth daily. 09/25/23   Sonny Masters, FNP  docusate sodium (COLACE) 100 MG capsule Take 1 capsule (100 mg total) by mouth every 12 (twelve) hours. 10/08/23   Burgess Amor, PA-C  donepezil (ARICEPT) 10 MG tablet Take 1 tablet (10 mg total) by mouth every morning. 09/25/23   Sonny Masters, FNP  ELIQUIS 5 MG TABS tablet TAKE 1 TABLET TWICE A DAY 09/07/23   Sonny Masters, FNP  escitalopram (LEXAPRO) 20 MG tablet Take 1 tablet (20 mg total) by mouth every morning. 09/25/23   Sonny Masters, FNP  furosemide (LASIX) 20 MG tablet TAKE 1 TABLET DAILY 05/27/23   Sonny Masters, FNP  glucose blood test strip EVERY DAY 04/22/18   [provider]  HYDROcodone-acetaminophen (NORCO/VICODIN) 5-325 MG tablet Take 1 tablet by mouth every 6 (six) hours as needed for moderate pain. 05/04/23   Eldred Manges, MD  hydrocortisone cream 1 % Apply to affected area 2 times daily Patient taking differently: Apply 1 Application topically 2 (two) times daily as needed for itching. 03/05/23   Marita Kansas, PA-C  Insulin Pen Needle 32G X 4 MM MISC USE AS DIRECTED 01/20/18   [provider]  lactulose (CHRONULAC) 10 GM/15ML solution Take 45 mLs (30 g total) by mouth daily as needed for mild constipation. 09/25/23   Sonny Masters, FNP  lisinopril (ZESTRIL)  5 MG tablet TAKE 1 TABLET DAILY 05/27/23   Sonny Masters, FNP  omeprazole (PRILOSEC) 40 MG capsule TAKE 1 CAPSULE DAILY 09/07/23   Sonny Masters, FNP  ondansetron (ZOFRAN) 4 MG tablet Take 1 tablet (4 mg total) by mouth every 8 (eight) hours as needed for nausea or vomiting. 10/08/23   Burgess Amor, PA-C  ondansetron (ZOFRAN) 4 MG tablet Take 1 tablet (4 mg total) by mouth every 6 (six) hours. 10/08/23   Idol, Raynelle Fanning, PA-C  ondansetron (ZOFRAN-ODT) 8 MG disintegrating tablet TAKE 1 TABLET BY MOUTH EVERY 8 HOURS AS NEEDED FOR NAUSEA & VOMITING 10/12/23   Doreatha Massed, MD   oxyCODONE-acetaminophen (PERCOCET/ROXICET) 5-325 MG tablet Take 1 tablet by mouth every 6 (six) hours as needed for severe pain (pain score 7-10). 10/08/23   Burgess Amor, PA-C  oxyCODONE-acetaminophen (PERCOCET/ROXICET) 5-325 MG tablet Take 1 tablet by mouth every 4 (four) hours as needed. 10/08/23   Burgess Amor, PA-C  TRUEplus Lancets 28G MISC Apply 1 each topically 3 (three) times daily. 10/04/22   [provider]  Stann Ore MICRO PEN NEEDLES 32G X 4 MM MISC  05/03/18   [provider]      Allergies    Eliquis [apixaban]    Review of Systems   Review of Systems  Constitutional:  Negative for fever.  Respiratory:  Negative for cough.   Cardiovascular:  Negative for chest pain.  Gastrointestinal:  Positive for abdominal pain, anorexia, constipation, nausea and vomiting. Negative for diarrhea.  Genitourinary:  Negative for dysuria and hematuria.    Physical Exam Updated Vital Signs BP 122/83 (BP Location: Left Arm)   Pulse 78   Temp 98.3 F (36.8 C) (Oral)   Resp 18   SpO2 96%  Physical Exam Vitals and nursing note reviewed.  Constitutional:      General: He is not in acute distress.    Appearance: He is well-developed.  HENT:     Head: Normocephalic and atraumatic.  Eyes:     Conjunctiva/sclera: Conjunctivae normal.  Cardiovascular:     Rate and Rhythm: Normal rate and regular rhythm.     Heart sounds: No murmur heard. Pulmonary:     Effort: Pulmonary effort is normal. No respiratory distress.     Breath sounds: Normal breath sounds.  Abdominal:     Palpations: Abdomen is soft. There is no pulsatile mass.     Tenderness: There is no abdominal tenderness. There is no guarding or rebound.  Musculoskeletal:        General: No swelling.     Cervical back: Neck supple.  Skin:    General: Skin is warm and dry.     Capillary Refill: Capillary refill takes less than 2 seconds.  Neurological:     General: No focal deficit present.     Mental Status: He is  alert.     ED Results / Procedures / Treatments   Labs (all labs ordered are listed, but only abnormal results are displayed) Labs Reviewed  CBC WITH DIFFERENTIAL/PLATELET - Abnormal; Notable for the following components:      Result Value   RBC 2.64 (*)    Hemoglobin 7.9 (*)    HCT 27.0 (*)    MCV 102.3 (*)    MCHC 29.3 (*)    RDW 20.4 (*)    Monocytes Absolute 2.4 (*)    Abs Immature Granulocytes 0.30 (*)    All other components within normal limits  URINALYSIS, W/ REFLEX TO CULTURE (INFECTION SUSPECTED) -  Abnormal; Notable for the following components:   Hgb urine dipstick SMALL (*)    Protein, ur 30 (*)    All other components within normal limits  COMPREHENSIVE METABOLIC PANEL - Abnormal; Notable for the following components:   Potassium 3.3 (*)    Glucose, Bld 172 (*)    Calcium 8.6 (*)    Albumin 3.4 (*)    AST 11 (*)    Total Bilirubin 1.2 (*)    All other components within normal limits  MAGNESIUM  LIPASE, BLOOD  GLUCOSE, CAPILLARY  HEMOGLOBIN A1C  VITAMIN B12  FOLATE  BASIC METABOLIC PANEL  CBC    EKG None  Radiology CT Angio Abd/Pel W and/or Wo Contrast Result Date: 10/22/2023 CLINICAL DATA:  82 year old male with history of abdominal aortic aneurysm and worsening lower abdominal pain. EXAM: CT ANGIOGRAPHY ABDOMEN AND PELVIS WITH CONTRAST AND WITHOUT CONTRAST TECHNIQUE: Multidetector CT imaging of the abdomen and pelvis was performed using the standard protocol during bolus administration of intravenous contrast. Multiplanar reconstructed images and MIPs were obtained and reviewed to evaluate the vascular anatomy. RADIATION DOSE REDUCTION: This exam was performed according to the departmental dose-optimization program which includes automated exposure control, adjustment of the mA and/or kV according to patient size and/or use of iterative reconstruction technique. CONTRAST:  OMNIPAQUE IOHEXOL 350 MG/ML SOLN COMPARISON:  10/08/2023, 12/09/2022 FINDINGS:  VASCULAR Aorta: Similar appearing postsurgical changes after aortobi-iliac endograft repair in addition to endoleak repair and embolization of the right internal iliac artery. The aneurysm sac measures up to 7.0 cm, previously 7.2 cm in November and 6.5 cm in December, prior to endoleak repair. There is no definite evidence of persistent endoleak. The suprarenal, uncovered abdominal aorta is normal in caliber and patent. Celiac: Patent without evidence of aneurysm, dissection, vasculitis or significant stenosis. SMA: Patent without evidence of aneurysm, dissection, vasculitis or significant stenosis. Renals: Single bilateral renal arteries are patent without evidence of aneurysm, dissection, vasculitis, fibromuscular dysplasia or significant stenosis. IMA: Excluded by endograft proximally. Otherwise distally patent without evidence of aneurysm, dissection, vasculitis or significant stenosis. Inflow: Branched endograft limb extending to the proximal internal external iliac arteries on the left without complicating features. Single endograft limb extending through the mid right external iliac artery, patent. Proximal Outflow: Bilateral common femoral and visualized portions of the superficial and profunda femoral arteries are patent without evidence of aneurysm, dissection, vasculitis or significant stenosis. Veins: No obvious venous abnormality within the limitations of this arterial phase study. Review of the MIP images confirms the above findings. NON-VASCULAR Lower chest: No acute abnormality. Hepatobiliary: No focal liver abnormality is seen. No gallstones, gallbladder wall thickening, or biliary dilatation. Pancreas: Unremarkable. No pancreatic ductal dilatation or surrounding inflammatory changes. Spleen: Normal in size without focal abnormality. Adrenals/Urinary Tract: Adrenal glands are unremarkable. Unchanged scattered simple renal cysts, not requiring additional follow-up. Kidneys are otherwise normal,  without renal calculi, focal lesion, or hydronephrosis. Bladder is unremarkable. Stomach/Bowel: Stomach is within normal limits. Appendix appears normal. Pancolonic diverticulosis, most prominent about the sigmoid colon with similar appearing pericolonic fat stranding about the sigmoid. Lymphatic: No abdominopelvic lymphadenopathy. Reproductive: The prostate is enlarged measuring up to 5.8 cm in maximum axial dimension with median lobe bulge upon the base of the bladder and internal dystrophic calcifications. Other: No abdominal wall hernia or abnormality. No abdominopelvic ascites. Musculoskeletal: No acute or significant osseous findings. IMPRESSION: VASCULAR 1. No acute abdominopelvic vascular abnormality. 2. 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 No evidence of persistent endoleak.  NON-VASCULAR 1. Similar appearing uncomplicated sigmoid diverticulitis. 2. Prostatomegaly. Marliss Coots, MD [facility] Radiology Electronically Signed   By: Marliss Coots M.D.   On: 10/22/2023 13:36    Procedures Procedures    Medications Ordered in ED Medications  sodium chloride 0.9 % bolus 500 mL (has no administration in time range)  ondansetron (ZOFRAN) injection 4 mg (has no administration in time range)  morphine (PF) 4 MG/ML injection 4 mg (has no administration in time range)    ED Course/ Medical Decision Making/ A&P Clinical Course as of 10/22/23 2011  Thu Oct 22, 2023  1414 Patient CT does not show any evidence of endoleak but does still have uncomplicated diverticulitis.  Due to his pain and the fact that he is already gone through a course of oral antibiotics feel he should be admitted to the hospital.  Patient is agreeable to plan for admission.  Hospitalist has been paged. [MB]  1429 Discussed with Dr. Mariea Clonts [facility] hospitalist who will evaluate patient for admission. [MB]    Clinical Course User Index [MB] Terrilee Files, MD                                  Medical Decision Making Amount and/or Complexity of Data Reviewed Labs: ordered. Radiology: ordered.  Risk Prescription drug management. Decision regarding hospitalization.   This patient complains of continued recurrent abdominal pain; this involves an extensive number of treatment Options and is a complaint that carries with it a high risk of complications and morbidity. The differential includes diverticulitis, colitis, perforation, other intra-abdominal catastrophe, constipation  I ordered, reviewed and interpreted labs, which included CBC with normal white count, hemoglobin lower than baseline, chemistries with mildly low potassium, urinalysis without clear signs of infection, lipase normal I ordered medication IV fluids IV pain medicine IV antibiotics and reviewed PMP when indicated. I ordered imaging studies which included CT angio abdomen and pelvis and I independently    visualized and interpreted imaging which showed no evidence of endoleak, no acute findings.  Does have persistent acute diverticulitis Additional history obtained from patient's wife Previous records obtained and reviewed in epic including recent ED notes and GI notes I consulted [facility] hospitalist Dr. Mariea Clonts and discussed lab and imaging findings and discussed disposition.  Cardiac monitoring reviewed, atrial fibrillation Social determinants considered, no significant barriers Critical Interventions: None  After the interventions stated above, I reevaluated the patient and found patient to have fairly benign exam although complaining of still significant pain Admission and further testing considered, patient would benefit from mission and IV antibiotics.  May need further GI interventions.         Final Clinical Impression(s) / ED Diagnoses Final diagnoses:  Acute diverticulitis  Chronic bilateral lower abdominal pain    Rx / DC Orders ED Discharge Orders      None         Terrilee Files, MD 10/22/23 2015

## 2023-10-22 NOTE — Plan of Care (Signed)

## 2023-10-22 NOTE — ED Notes (Signed)
Pt given a urinal and informed of need for urine specimen

## 2023-10-22 NOTE — Consult Note (Signed)
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 sensitivity in detecting complications related to the graft. 3. Small pericardial effusion. 4. 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 5. Prostatomegaly. 6. Multilevel lumbar degenerative disc disease. 7. Mild degenerative hip arthropathy bilaterally. 8. Aortic atherosclerosis. Aortic Atherosclerosis (ICD10-I70.0). Electronically Signed   By: Gaylyn Rong M.D.   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    Abdominal pain: etiology unclear. CT suggesting acute diverticulitis 10/08/23 but not improvement with Augmentin, current CTA  with unchanged findings in the sigmoid colon region. Interestingly, he had extensive work up at [facility] in 05/2023 with ongoing abdominal pain/back pain at which time his endoleak was treated but he also received steroids for aortitis possible related to his myelodysplastic syndrome. Current CTA also with mention of pancreas divisum and chronic pancreatitis (just recently mentioned on CT, not previously reported on prior numerous studies) although his current symptoms are not typical.   Cirrhosis: previous imaging suggestive of cirrhosis (CTA 2018), with borderline splenomegaly, intermittent thrombocytopenia. No specific liver abnormality mentioned on current imaging studies. May be very early disease. Appears to be well compensated. Patient reports quit etoh 2 decades ago. Although previously reported quit in 2020.   Constipation: poorly controlled, likely contributing to symptoms.  Plan:   Agree with IV antibiotic regimen to cover for diverticulitis. If no improvement in symptoms, would consider general surgery evaluation.  ?recurrent aortitis and may benefit from steroids? Miralax bid and senokot two nightly ordered.   LOS: 0 days   We would like to thank you for the opportunity to participate in the care of ALTUS SMEDBERG.  Leanna Battles. Dixon Boos [facility] [phone] 12/12/20243:16 PM

## 2023-10-23 DIAGNOSIS — K7460 Unspecified cirrhosis of liver: Secondary | ICD-10-CM

## 2023-10-23 DIAGNOSIS — D649 Anemia, unspecified: Secondary | ICD-10-CM

## 2023-10-23 DIAGNOSIS — K5909 Other constipation: Secondary | ICD-10-CM | POA: Diagnosis not present

## 2023-10-23 DIAGNOSIS — K5792 Diverticulitis of intestine, part unspecified, without perforation or abscess without bleeding: Secondary | ICD-10-CM | POA: Diagnosis not present

## 2023-10-23 DIAGNOSIS — I7143 Infrarenal abdominal aortic aneurysm, without rupture: Secondary | ICD-10-CM | POA: Diagnosis not present

## 2023-10-23 DIAGNOSIS — K5732 Diverticulitis of large intestine without perforation or abscess without bleeding: Secondary | ICD-10-CM | POA: Diagnosis not present

## 2023-10-23 LAB — BASIC METABOLIC PANEL
Anion gap: 10 (ref 5–15)
BUN: 12 mg/dL (ref 8–23)
CO2: 24 mmol/L (ref 22–32)
Calcium: 8.6 mg/dL — ABNORMAL LOW (ref 8.9–10.3)
Chloride: 104 mmol/L (ref 98–111)
Creatinine, Ser: 0.84 mg/dL (ref 0.61–1.24)
GFR, Estimated: >60 mL/min (ref >60)
Glucose, Bld: 137 mg/dL — ABNORMAL HIGH (ref 70–99)
Potassium: 3.7 mmol/L (ref 3.5–5.1)
Sodium: 138 mmol/L (ref 135–145)

## 2023-10-23 LAB — CBC
HCT: 25.1 % — ABNORMAL LOW (ref 39.0–52.0)
Hemoglobin: 7.1 g/dL — ABNORMAL LOW (ref 13.0–17.0)
MCH: 29.6 pg (ref 26.0–34.0)
MCHC: 28.3 g/dL — ABNORMAL LOW (ref 30.0–36.0)
MCV: 104.6 fL — ABNORMAL HIGH (ref 80.0–100.0)
Platelets: 155 10*3/uL (ref 150–400)
RBC: 2.4 MIL/uL — ABNORMAL LOW (ref 4.22–5.81)
RDW: 20.4 % — ABNORMAL HIGH (ref 11.5–15.5)
WBC: 9.9 10*3/uL (ref 4.0–10.5)
nRBC: 0 % (ref 0.0–0.2)

## 2023-10-23 LAB — OCCULT BLOOD X 1 CARD TO LAB, STOOL
Fecal Occult Bld: POSITIVE — AB
Fecal Occult Bld: POSITIVE — AB

## 2023-10-23 LAB — FOLATE: Folate: 6.4 ng/mL (ref >5.9)

## 2023-10-23 LAB — GLUCOSE, CAPILLARY
Glucose-Capillary: 128 mg/dL — ABNORMAL HIGH (ref 70–99)
Glucose-Capillary: 123 mg/dL — ABNORMAL HIGH (ref 70–99)
Glucose-Capillary: 119 mg/dL — ABNORMAL HIGH (ref 70–99)
Glucose-Capillary: 130 mg/dL — ABNORMAL HIGH (ref 70–99)

## 2023-10-23 LAB — VITAMIN B12: Vitamin B-12: 343 pg/mL (ref 180–914)

## 2023-10-23 LAB — ABO/RH: ABO/RH(D): O POS

## 2023-10-23 MED ORDER — LINACLOTIDE 72 MCG PO CAPS: 72.0000 ug | ORAL_CAPSULE | Freq: Every day | ORAL | Status: DC

## 2023-10-23 MED ORDER — SODIUM CHLORIDE 0.9% IV SOLUTION: Freq: Once | INTRAVENOUS | Status: AC

## 2023-10-23 MED ORDER — LINACLOTIDE 145 MCG PO CAPS
290.0000 ug | ORAL_CAPSULE | Freq: Every day | ORAL | Status: DC
  Filled 2023-10-23: qty 2

## 2023-10-23 MED ORDER — FUROSEMIDE 10 MG/ML IJ SOLN
20.0000 mg | Freq: Once | INTRAMUSCULAR | Status: AC
  Administered 2023-10-24: 20 mg via INTRAVENOUS
  Filled 2023-10-23: qty 2

## 2023-10-23 NOTE — Progress Notes (Signed)
PROGRESS NOTE   David Duarte, is a 82 y.o. male, DOB - 12-25-1940, YQM:[id number]  Admit date - 10/22/2023   Admitting Physician Caven Perine Mariea Clonts, MD  Outpatient Primary MD for the patient is Rakes, Doralee Albino, FNP  LOS - 1  Chief Complaint  Patient presents with   Abdominal Pain      Brief Narrative:   82 y.o. 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 , he has chronic type II endoleak with growth of aneurysm sac from 1 to 1.5 cm over 2 years.  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  He was admitted on 10/22/2023 with persistent acute diverticulitis after failing outpatient Augmentin    -Assessment and Plan: 1) acute persistent sigmoid diverticulitis--- patient failed outpatient Augmentin that was started on 10/08/2023 -CT abdomen and pelvis on 10/22/23 consistent with uncomplicated sigmoid diverticulitis -No fevers or leukocytosis, however patient is immunocompromised --Patient reports constipation and weight loss--last colonoscopy February 2022 with finding of diverticulosis , underwent polypectomy consistent with tubular adenomas -GI consult appreciated, GI team requested general surgery input -Continue Rocephin and Flagyl  2)History of PE and A-fib on chronic --- hold PTA Eliquis--- due to Hgb dropping compared to baseline -Continue Coreg for rate control =-No obvious bleeding noted at this time  3)Chronic 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 , he has chronic type II endoleak with growth of aneurysm sac from 1 to 1.5 cm over 2 years-- -CT angio of the abdomen and pelvis dated 10/22/2023 shows--No acute abdominopelvic vascular abnormality. 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 No evidence of persistent endoleak. -Continue fenofibrate and atorvastatin --Outpatient follow-up with vascular surgery for surveillance as previously advised  6)Liver cirrhosis with history of prior alcohol use--- LFTs are not elevated- -no encephalopathy -Routine surveillance/outpatient monitoring by GI team advised  7)Depression/Dementia--- stable, continue Aricept and Lexapro  8)GERD--continue Protonix  9)HTN--stable, continue Coreg and lisinopril  10)DM2-A1c is 4.9 reflecting excellent diabetic control PTA -Use Novolog/Humalog Sliding scale insulin with Accu-Cheks/Fingersticks as ordered   11) constipation--- had BM on 10/23/2023 with laxatives -Apparently is intolerant to Linzess (gets bad diarrhea)  Status is: Inpatient  Dispo: The patient is from: Home              Anticipated d/c is to: Home  Status is: Inpatient   Disposition: The patient is from: Home              Anticipated d/c is to: Home  Anticipated d/c date is: 1 day              Patient currently is not medically stable to  d/c. Barriers: Not Clinically Stable-   Code Status :  -  Code Status: Full Code   Family Communication:    NA (patient is alert, awake and coherent)   DVT Prophylaxis  :   - SCDs   SCDs Start: 10/22/23 1538 Place TED hose Start: 10/22/23 1538   Lab Results  Component Value Date   PLT 155 10/23/2023    Inpatient Medications  Scheduled Meds:  sodium chloride   Intravenous Once   atorvastatin  20 mg Oral q morning   carvedilol  3.125 mg Oral BID WC   donepezil  10 mg Oral q morning   escitalopram  20 mg Oral q morning   feeding supplement  237 mL Oral BID BM   fenofibrate  160 mg Oral Daily   furosemide  20 mg Intravenous Once   furosemide  20 mg Oral Daily   insulin aspart  0-5 Units Subcutaneous QHS   insulin aspart  0-6 Units Subcutaneous TID WC   lisinopril  5 mg Oral Daily   pantoprazole  40 mg Oral Daily   polyethylene glycol  17 g Oral BID   senna-docusate  2 tablet Oral QHS   sodium chloride flush  3 mL Intravenous Q12H   sodium chloride flush  3 mL Intravenous Q12H   Continuous Infusions:  cefTRIAXone (ROCEPHIN)  IV 2 g (10/23/23 1400)   metronidazole 500 mg (10/23/23 0917)   PRN Meds:.acetaminophen **OR** acetaminophen, bisacodyl, HYDROmorphone (DILAUDID) injection, ondansetron **OR** ondansetron (ZOFRAN) IV, oxyCODONE, polyethylene glycol, sodium chloride flush, traZODone   Anti-infectives (From admission, onward)    Start     Dose/Rate Route Frequency Ordered Stop   10/23/23 1400  cefTRIAXone (ROCEPHIN) 2 g in sodium chloride 0.9 % 100 mL IVPB        2 g 200 mL/hr over 30 Minutes Intravenous Every 24 hours 10/22/23 1422 10/28/23 1359   10/22/23 2000  metroNIDAZOLE (FLAGYL) IVPB 500 mg        500 mg 100 mL/hr over 60 Minutes Intravenous Every 12 hours 10/22/23 1422 10/27/23 1959   10/22/23 1415  cefTRIAXone (ROCEPHIN) 2 g in sodium chloride 0.9 % 100 mL IVPB       Placed in "And" Linked Group   2 g 200 mL/hr over 30 Minutes Intravenous  Once 10/22/23  1409 10/22/23 1449   10/22/23 1415  metroNIDAZOLE (FLAGYL) IVPB 500 mg       Placed in "And" Linked Group   500 mg 100 mL/hr over 60 Minutes Intravenous  Once 10/22/23 1409 10/22/23 1753        Subjective: David Duarte today has no fevers, no emesis,  No chest pain,   -Had brown BM with laxatives -Abdominal pain is not worse  Objective: Vitals:   10/23/23 0906 10/23/23 0921 10/23/23 1406 10/23/23 1608  BP: 105/62 105/62 120/64 (!) 140/76  Pulse: 70 70 85 77  Resp:   18   Temp:   97.7 F (36.5 C)   TempSrc:   Oral   SpO2: 97%  99% 98%  Weight:      Height:        Intake/Output Summary (Last 24 hours) at 10/23/2023 1641 Last data filed at 10/23/2023 1300 Gross per 24 hour  Intake 1435.74 ml  Output 400 ml  Net 1035.74 ml   [employer]  10/22/23 1520  Weight: 94.3 kg    Physical Exam  Gen:- Awake Alert,  in no apparent distress  HEENT:- [place].AT, No sclera icterus Neck-Supple Neck,No JVD,.  Lungs-  CTAB , fair symmetrical air movement CV- S1, S2 normal, regular  Abd-  +ve B.Sounds, Abd Soft, lower abdominal tenderness is not worse,    Extremity/Skin:- No  edema, pedal pulses present  Psych-affect is appropriate, oriented x3 Neuro-no new focal deficits, no tremors  Data Reviewed: I have personally reviewed following labs and imaging studies  CBC: Recent Labs  Lab 10/22/23 1013 10/23/23 0416  WBC 8.9 9.9  NEUTROABS 5.5  --   HGB 7.9* 7.1*  HCT 27.0* 25.1*  MCV 102.3* 104.6*  PLT 164 155   Basic Metabolic Panel: Recent Labs  Lab 10/22/23 1013 10/23/23 0416  NA 136 138  K 3.3* 3.7  CL 101 104  CO2 25 24  GLUCOSE 172* 137*  BUN 13 12  CREATININE 0.95 0.84  CALCIUM 8.6* 8.6*  MG 1.9  --    GFR: Estimated Creatinine Clearance: 82.2 mL/min (by C-G formula based on SCr of 0.84 mg/dL). Liver Function Tests: Recent Labs  Lab 10/22/23 1013  AST 11*  ALT 6  ALKPHOS 42  BILITOT 1.2*  PROT 7.4  ALBUMIN 3.4*   HbA1C: Recent Labs     10/22/23 1013  HGBA1C 4.3*   Radiology Studies: CT Angio Abd/Pel W and/or Wo Contrast Result Date: 10/22/2023 CLINICAL DATA:  82 year old male with history of abdominal aortic aneurysm and worsening lower abdominal pain. EXAM: CT ANGIOGRAPHY ABDOMEN AND PELVIS WITH CONTRAST AND WITHOUT CONTRAST TECHNIQUE: Multidetector CT imaging of the abdomen and pelvis was performed using the standard protocol during bolus administration of intravenous contrast. Multiplanar reconstructed images and MIPs were obtained and reviewed to evaluate the vascular anatomy. RADIATION DOSE REDUCTION: This exam was performed according to the departmental dose-optimization program which includes automated exposure control, adjustment of the mA and/or kV according to patient size and/or use of iterative reconstruction technique. CONTRAST:  OMNIPAQUE IOHEXOL 350 MG/ML SOLN COMPARISON:  10/08/2023, 12/09/2022 FINDINGS: VASCULAR Aorta: Similar appearing postsurgical changes after aortobi-iliac endograft repair in addition to endoleak repair and embolization of the right internal iliac artery. The aneurysm sac measures up to 7.0 cm, previously 7.2 cm in November and 6.5 cm in December, prior to endoleak repair. There is no definite evidence of persistent endoleak. The suprarenal, uncovered abdominal aorta is normal in caliber and patent. Celiac: Patent without evidence of aneurysm, dissection, vasculitis or significant stenosis. SMA: Patent without evidence of aneurysm, dissection, vasculitis or significant stenosis. Renals: Single bilateral renal arteries are patent without evidence of aneurysm, dissection, vasculitis, fibromuscular dysplasia or significant stenosis. IMA: Excluded by endograft proximally. Otherwise distally patent without evidence of aneurysm, dissection, vasculitis or significant stenosis. Inflow: Branched endograft limb extending to the proximal internal external iliac arteries on the left without complicating  features. Single endograft limb extending through the mid right external iliac artery, patent. Proximal Outflow: Bilateral common femoral and visualized portions of the superficial and profunda femoral arteries are patent without evidence of aneurysm, dissection, vasculitis or significant stenosis. Veins: No obvious venous abnormality within the limitations of this arterial phase study. Review of the MIP images confirms the above findings. NON-VASCULAR Lower chest: No acute abnormality. Hepatobiliary: No focal liver abnormality is seen. No gallstones, gallbladder wall thickening, or biliary dilatation. Pancreas: Unremarkable. No pancreatic ductal dilatation or surrounding inflammatory changes. Spleen: Normal in size without focal abnormality. Adrenals/Urinary Tract: Adrenal glands  are unremarkable. Unchanged scattered simple renal cysts, not requiring additional follow-up. Kidneys are otherwise normal, without renal calculi, focal lesion, or hydronephrosis. Bladder is unremarkable. Stomach/Bowel: Stomach is within normal limits. Appendix appears normal. Pancolonic diverticulosis, most prominent about the sigmoid colon with similar appearing pericolonic fat stranding about the sigmoid. Lymphatic: No abdominopelvic lymphadenopathy. Reproductive: The prostate is enlarged measuring up to 5.8 cm in maximum axial dimension with median lobe bulge upon the base of the bladder and internal dystrophic calcifications. Other: No abdominal wall hernia or abnormality. No abdominopelvic ascites. Musculoskeletal: No acute or significant osseous findings. IMPRESSION: VASCULAR 1. No acute abdominopelvic vascular abnormality. 2. 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 No evidence of persistent endoleak. NON-VASCULAR 1. Similar appearing uncomplicated sigmoid diverticulitis. 2. Prostatomegaly. Marliss Coots, MD [facility] Electronically Signed   By: Marliss Coots M.D.   On: 10/22/2023 13:36   Scheduled Meds:  sodium chloride   Intravenous Once   atorvastatin  20 mg Oral q morning   carvedilol  3.125 mg Oral BID WC   donepezil  10 mg Oral q morning   escitalopram  20 mg Oral q morning   feeding supplement  237 mL Oral BID BM   fenofibrate  160 mg Oral Daily   furosemide  20 mg Intravenous Once   furosemide  20 mg Oral Daily   insulin aspart  0-5 Units Subcutaneous QHS   insulin aspart  0-6 Units Subcutaneous TID WC   lisinopril  5 mg Oral Daily   pantoprazole  40 mg Oral Daily   polyethylene glycol  17 g Oral BID   senna-docusate  2 tablet Oral QHS   sodium chloride flush  3 mL Intravenous Q12H   sodium chloride flush  3 mL Intravenous Q12H   Continuous Infusions:  cefTRIAXone (ROCEPHIN)  IV 2 g (10/23/23 1400)   metronidazole 500 mg (10/23/23 0917)    LOS: 1 day   Shon Hale M.D on 10/23/2023 at 4:41 PM  Go to [email or web address] - for contact info  [facility] - Office  [phone]  If 7PM-7AM, please contact night-coverage [email or web address] 10/23/2023, 4:41 PM

## 2023-10-23 NOTE — Plan of Care (Signed)
  Problem: Education: Goal: Knowledge of General Education information will improve Description: Including pain rating scale, medication(s)/side effects and non-pharmacologic comfort measures Outcome: Progressing   Problem: Health Behavior/Discharge Planning: Goal: Ability to manage health-related needs will improve Outcome: Progressing   Problem: Clinical Measurements: Goal: Ability to maintain clinical measurements within normal limits will improve Outcome: Progressing Goal: Will remain free from infection Outcome: Progressing Goal: Diagnostic test results will improve Outcome: Progressing Goal: Respiratory complications will improve Outcome: Progressing Goal: Cardiovascular complication will be avoided Outcome: Progressing   Problem: Activity: Goal: Risk for activity intolerance will decrease Outcome: Progressing   Problem: Nutrition: Goal: Adequate nutrition will be maintained Outcome: Progressing   Problem: Coping: Goal: Level of anxiety will decrease Outcome: Progressing   Problem: Elimination: Goal: Will not experience complications related to urinary retention Outcome: Progressing   Problem: Pain Management: Goal: General experience of comfort will improve Outcome: Progressing   Problem: Safety: Goal: Ability to remain free from injury will improve Outcome: Progressing   Problem: Skin Integrity: Goal: Risk for impaired skin integrity will decrease Outcome: Progressing   Problem: Education: Goal: Ability to describe self-care measures that may prevent or decrease complications (Diabetes Survival Skills Education) will improve Outcome: Progressing Goal: Individualized Educational Video(s) Outcome: Progressing   Problem: Coping: Goal: Ability to adjust to condition or change in health will improve Outcome: Progressing   Problem: Fluid Volume: Goal: Ability to maintain a balanced intake and output will improve Outcome: Progressing   Problem: Health  Behavior/Discharge Planning: Goal: Ability to identify and utilize available resources and services will improve Outcome: Progressing Goal: Ability to manage health-related needs will improve Outcome: Progressing   Problem: Metabolic: Goal: Ability to maintain appropriate glucose levels will improve Outcome: Progressing   Problem: Skin Integrity: Goal: Risk for impaired skin integrity will decrease Outcome: Progressing   Problem: Tissue Perfusion: Goal: Adequacy of tissue perfusion will improve Outcome: Progressing

## 2023-10-23 NOTE — Plan of Care (Signed)
?  Problem: Coping: ?Goal: Level of anxiety will decrease ?Outcome: Progressing ?  ?Problem: Safety: ?Goal: Ability to remain free from injury will improve ?Outcome: Progressing ?  ?

## 2023-10-23 NOTE — Progress Notes (Signed)
   10/23/23 0906  TOC Brief Assessment  Insurance and Status Reviewed  Patient has primary care physician Yes  Home environment has been reviewed from home  Prior level of function: independent  Prior/Current Home Services No current home services  Social Drivers of Health Review SDOH reviewed no interventions necessary  Readmission risk has been reviewed Yes  Transition of care needs no transition of care needs at this time    Transition of Care Department [facility]) has reviewed patient and no TOC needs have been identified at this time. We will continue to monitor patient advancement through interdisciplinary progression rounds. If new patient transition needs arise, please place a TOC consult.

## 2023-10-23 NOTE — Progress Notes (Addendum)
Gastroenterology Progress Note   Referring Provider: No ref. provider found Primary Care Physician:  Sonny Masters, FNP Primary Gastroenterologist:  Gerrit Friends.Rourk, MD  Patient ID: David Duarte; [id number]; 29-Sep-1941    Subjective   Patient reports he had a better night. His pain is not as severe and he has not been nauseas this morning. His pain is coming and going. He wants to continue liquids for now. He states no BM in 3-4 days and prior to this he used some suppositories at home and still did not have much of a BM.   Objective   Vital signs in last 24 hours Temp:  [97.9 F (36.6 C)-98.4 F (36.9 C)] 98.2 F (36.8 C) (12/13 0517) Pulse Rate:  [67-94] 70 (12/13 0921) Resp:  [17-19] 18 (12/13 0517) BP: (105-142)/(62-86) 105/62 (12/13 0921) SpO2:  [94 %-98 %] 97 % (12/13 0906) Weight:  [94.3 kg] 94.3 kg (12/12 1520) Last BM Date : 10/20/23  Physical Exam General:   Alert and oriented, pleasant Head:  Normocephalic and atraumatic. Eyes:  No icterus, sclera clear. Conjuctiva pink.  Mouth:  Without lesions, mucosa pink and moist.  Neck:  Supple, without thyromegaly or masses.  Abdomen:  Bowel sounds present, soft, non-tender, non-distended. No HSM or hernias noted. No rebound or guarding. No masses appreciated  Extremities:  Without clubbing or edema. Neurologic:  Alert and oriented x4;  grossly normal neurologically. Psych:  Alert and cooperative. Normal mood and affect.  Intake/Output from previous day: 12/12 0701 - 12/13 0700 In: 815.7 [P.O.:480; IV Piggyback:335.7] Out: -  Intake/Output this shift: Total I/O In: 620 [P.O.:620] Out: -   Lab Results  Recent Labs    10/22/23 1013 10/23/23 0416  WBC 8.9 9.9  HGB 7.9* 7.1*  HCT 27.0* 25.1*  PLT 164 155   BMET Recent Labs    10/22/23 1013 10/23/23 0416  NA 136 138  K 3.3* 3.7  CL 101 104  CO2 25 24  GLUCOSE 172* 137*  BUN 13 12  CREATININE 0.95 0.84  CALCIUM 8.6* 8.6*   LFT Recent Labs     10/22/23 1013  PROT 7.4  ALBUMIN 3.4*  AST 11*  ALT 6  ALKPHOS 42  BILITOT 1.2*   PT/INR No results for input(s): "LABPROT", "INR" in the last 72 hours. Hepatitis Panel No results for input(s): "HEPBSAG", "HCVAB", "HEPAIGM", "HEPBIGM" in the last 72 hours.   Studies/Results CT Angio Abd/Pel W and/or Wo Contrast Result Date: 10/22/2023 CLINICAL DATA:  82 year old male with history of abdominal aortic aneurysm and worsening lower abdominal pain. EXAM: CT ANGIOGRAPHY ABDOMEN AND PELVIS WITH CONTRAST AND WITHOUT CONTRAST TECHNIQUE: Multidetector CT imaging of the abdomen and pelvis was performed using the standard protocol during bolus administration of intravenous contrast. Multiplanar reconstructed images and MIPs were obtained and reviewed to evaluate the vascular anatomy. RADIATION DOSE REDUCTION: This exam was performed according to the departmental dose-optimization program which includes automated exposure control, adjustment of the mA and/or kV according to patient size and/or use of iterative reconstruction technique. CONTRAST:  OMNIPAQUE IOHEXOL 350 MG/ML SOLN COMPARISON:  10/08/2023, 12/09/2022 FINDINGS: VASCULAR Aorta: Similar appearing postsurgical changes after aortobi-iliac endograft repair in addition to endoleak repair and embolization of the right internal iliac artery. The aneurysm sac measures up to 7.0 cm, previously 7.2 cm in November and 6.5 cm in December, prior to endoleak repair. There is no definite evidence of persistent endoleak. The suprarenal, uncovered abdominal aorta is normal in caliber and patent. Celiac:  Patent without evidence of aneurysm, dissection, vasculitis or significant stenosis. SMA: Patent without evidence of aneurysm, dissection, vasculitis or significant stenosis. Renals: Single bilateral renal arteries are patent without evidence of aneurysm, dissection, vasculitis, fibromuscular dysplasia or significant stenosis. IMA: Excluded by endograft  proximally. Otherwise distally patent without evidence of aneurysm, dissection, vasculitis or significant stenosis. Inflow: Branched endograft limb extending to the proximal internal external iliac arteries on the left without complicating features. Single endograft limb extending through the mid right external iliac artery, patent. Proximal Outflow: Bilateral common femoral and visualized portions of the superficial and profunda femoral arteries are patent without evidence of aneurysm, dissection, vasculitis or significant stenosis. Veins: No obvious venous abnormality within the limitations of this arterial phase study. Review of the MIP images confirms the above findings. NON-VASCULAR Lower chest: No acute abnormality. Hepatobiliary: No focal liver abnormality is seen. No gallstones, gallbladder wall thickening, or biliary dilatation. Pancreas: Unremarkable. No pancreatic ductal dilatation or surrounding inflammatory changes. Spleen: Normal in size without focal abnormality. Adrenals/Urinary Tract: Adrenal glands are unremarkable. Unchanged scattered simple renal cysts, not requiring additional follow-up. Kidneys are otherwise normal, without renal calculi, focal lesion, or hydronephrosis. Bladder is unremarkable. Stomach/Bowel: Stomach is within normal limits. Appendix appears normal. Pancolonic diverticulosis, most prominent about the sigmoid colon with similar appearing pericolonic fat stranding about the sigmoid. Lymphatic: No abdominopelvic lymphadenopathy. Reproductive: The prostate is enlarged measuring up to 5.8 cm in maximum axial dimension with median lobe bulge upon the base of the bladder and internal dystrophic calcifications. Other: No abdominal wall hernia or abnormality. No abdominopelvic ascites. Musculoskeletal: No acute or significant osseous findings. IMPRESSION: VASCULAR 1. No acute abdominopelvic vascular abnormality. 2. 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 No evidence  of persistent endoleak. NON-VASCULAR 1. Similar appearing uncomplicated sigmoid diverticulitis. 2. Prostatomegaly. Marliss Coots, MD [facility] Electronically Signed   By: Marliss Coots M.D.   On: 10/22/2023 13:36   CT ABDOMEN PELVIS W CONTRAST Result Date: 10/08/2023 CLINICAL DATA:  Left lower quadrant abdominal pain.  Pallor. EXAM: CT ABDOMEN AND PELVIS WITH CONTRAST TECHNIQUE: Multidetector CT imaging of the abdomen and pelvis was performed using the standard protocol following bolus administration of intravenous contrast. RADIATION DOSE REDUCTION: This exam was performed according to the departmental dose-optimization program which includes automated exposure control, adjustment of the mA and/or kV according to patient size and/or use of iterative reconstruction technique. CONTRAST:  OMNIPAQUE IOHEXOL 300 MG/ML  SOLN COMPARISON:  03/05/2023 FINDINGS: Lower chest: Coronary and aortic atheromatous vascular calcification. Small pericardial effusion. 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 Spleen: Unremarkable Adrenals/Urinary Tract: 3.1 cm right kidney lower pole Bosniak category 1 exophytic cyst posteriorly on image 45 series 2. No further imaging workup of this lesion is indicated. Small benign left renal cyst. No further imaging workup of this lesion is indicated. Adrenal glands unremarkable. Stomach/Bowel: Substantial sigmoid diverticulosis with proximal sigmoid acute diverticulitis, inflamed sigmoid diverticulum on image 65 series 2 with a moderate amount of surrounding inflammatory edema in the mesentery and tracking along the left retroperitoneum. No extraluminal gas or abscess. Normal appendix. Vascular/Lymphatic: Interval coiling of the prior endoleak adjacent to the aorta bi-iliac graft. No obvious current endoleak although today's exam  was not performed as a CT angiogram and may lack sensitivity in detecting complications related to the graft. As before, the graft traverses the large native thrombosed aneurysm including common iliac aneurysms. Where not obscured by  streak artifact from the coils, the graft lumens appear patent. Atherosclerosis is present, including aortoiliac atherosclerotic disease. No pathologic adenopathy. Minimal retroperitoneal stranding around the aneurysm although this is been present on prior exams as well. Reproductive: Prostatomegaly. Other: Nonspecific presacral edema. Musculoskeletal: Multilevel lumbar degenerative disc disease. Mild degenerative hip arthropathy bilaterally. IMPRESSION: 1. Acute proximal sigmoid diverticulitis, without extraluminal gas or abscess. 2. Interval endovascular coiling of the prior type 2 endoleak adjacent to the aorta bi-iliac graft. No obvious current endoleak although today's exam was not performed as a CT angiogram and may lack sensitivity in detecting complications related to the graft. 3. Small pericardial effusion. 4. 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 5. Prostatomegaly. 6. Multilevel lumbar degenerative disc disease. 7. Mild degenerative hip arthropathy bilaterally. 8. Aortic atherosclerosis. Aortic Atherosclerosis (ICD10-I70.0). Electronically Signed   By: Gaylyn Rong M.D.   On: 10/08/2023 15:10    Assessment  82 y.o. 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   Abdominal pain:  Having pain in the lower abdomen. LLQ tenderness on exam today, no upper abdominal tenderness. Patient describes intermittent lower abdominal pain that can double him over at times and describes pain as being near the site of his recent repair. CT this admission with diverticulitis and failed outpatient Augmentin - currently receiving Cipro and Flagyl.  His degree of pain does not seem consistent with uncomplicated diverticulitis.  He has had extensive workup for his abdominal pain and back pain at [facility] in July. May need to consider surgical evaluation.   Question Recurrent aortitis, has been treated with steroids in the past however he reports he is unable to remember if his pain improved after treatment.  Pain is very intermittent in nature.  Could have an IBS component given his constipation as well as he does report some abdominal pain/cramping with need to have a bowel movement and has had difficulty.  He has also had some associated nausea.  Recent CTA with mention of pancreas divisum and chronic pancreatitis although his current pain is not consistent with pancreatitis.  He also has had noted patent SMA.  Cirrhosis: Had imaging suggesting cirrhosis in 2018 with borderline splenomegaly and intermittent thrombocytopenia.  There was no specific liver abnormality mentioned on his recent studies however he could be very early disease and is well compensated at this time.  He does have a history of prior alcohol abuse for which he quit about 20 years ago.  Constipation: Poorly controlled.  Has been using suppositories at home as needed.  Some lack of bowel movement could be due to poor p.o. intake.  Some pain suspicious of IBS component.  Currently on MiraLAX and Senokot with last bowel movement 3-4 days ago.  Will add Linzess to his regimen.  Anemia: Hgb has been downtrending over the last few weeks. 9.5>>8.6>>7.9>>7.  He does have myelodysplastic condition/CMML and has received intermittent blood  transfusions in the past.  Has had recent extensive workup with EGD, colonoscopy, and capsule study.  Plan / Recommendations  Continue miralax BID and senna 2 tablets nightly.  Add Linzess 290 mcg to regimen Continue antibiotics Agree  with blood transfusion, trend H/H Consider steroids for possible recurrent aortitis Consider surgical evaluation if no improvement.  Outpatient cirrhosis care/monitoring  Discussed with Dr. Tasia Catchings - recommends general surgery evaluation for persistent diverticulitis and possible aortitis.    LOS: 1 day   10/23/2023, 10:58 AM   Addendum: Received notification from the nurse that wife states patient has taken Linzess in the past and was unable to tolerate due to significant diarrhea.  He has tried Linzess 290 daily for a week and then had to stop for severe diarrhea.  Only tried lowest dose of Linzess for 1 day.  Given this the patient and wife request that he do not take this medication.  Linzess was discontinued.  Update received that he had at least 1 bowel movement this morning that was without the presence of melena or BRBPR.  If he does not have any further bowel movements today or no bowel movement by tomorrow morning then we may need to consider other prescription methods for treatment of constipation in the outpatient setting (Trulance, Amitiza, Ibsrela) which are not available inpatient.  Brooke Bonito, MSN, FNP-BC, AGACNP-BC [facility]

## 2023-10-23 NOTE — Care Management Important Message (Signed)
Important Message  Patient Details  Name: David Duarte MRN: [id number] Date of Birth: 1941/06/23   Important Message Given:  Yes - Medicare IM (spoke with by phone to reviewl letter, no additonal copy needed)     Corey Harold 10/23/2023, 1:02 PM

## 2023-10-24 DIAGNOSIS — K5732 Diverticulitis of large intestine without perforation or abscess without bleeding: Secondary | ICD-10-CM

## 2023-10-24 DIAGNOSIS — K7460 Unspecified cirrhosis of liver: Secondary | ICD-10-CM | POA: Diagnosis not present

## 2023-10-24 DIAGNOSIS — I7143 Infrarenal abdominal aortic aneurysm, without rupture: Secondary | ICD-10-CM | POA: Diagnosis not present

## 2023-10-24 DIAGNOSIS — K5909 Other constipation: Secondary | ICD-10-CM | POA: Diagnosis not present

## 2023-10-24 DIAGNOSIS — K5792 Diverticulitis of intestine, part unspecified, without perforation or abscess without bleeding: Secondary | ICD-10-CM | POA: Diagnosis not present

## 2023-10-24 DIAGNOSIS — R195 Other fecal abnormalities: Secondary | ICD-10-CM

## 2023-10-24 LAB — CBC
HCT: 31.3 % — ABNORMAL LOW (ref 39.0–52.0)
Hemoglobin: 9.2 g/dL — ABNORMAL LOW (ref 13.0–17.0)
MCH: 30.2 pg (ref 26.0–34.0)
MCHC: 29.4 g/dL — ABNORMAL LOW (ref 30.0–36.0)
MCV: 102.6 fL — ABNORMAL HIGH (ref 80.0–100.0)
Platelets: 171 10*3/uL (ref 150–400)
RBC: 3.05 MIL/uL — ABNORMAL LOW (ref 4.22–5.81)
RDW: 21 % — ABNORMAL HIGH (ref 11.5–15.5)
WBC: 9.5 10*3/uL (ref 4.0–10.5)
nRBC: 0.2 % (ref 0.0–0.2)

## 2023-10-24 LAB — BPAM RBC
Blood Product Expiration Date: 202412262359
ISSUE DATE / TIME: 202412132154
Unit Type and Rh: 5100

## 2023-10-24 LAB — GLUCOSE, CAPILLARY
Glucose-Capillary: 130 mg/dL — ABNORMAL HIGH (ref 70–99)
Glucose-Capillary: 137 mg/dL — ABNORMAL HIGH (ref 70–99)
Glucose-Capillary: 143 mg/dL — ABNORMAL HIGH (ref 70–99)

## 2023-10-24 NOTE — Progress Notes (Signed)
Did not want bbq and side dishes on supper tray.  Only ate roll and drank tea with no increased discomfort.  Declined any other snacks etc from nourishment room.  Reported another small bm

## 2023-10-24 NOTE — Plan of Care (Signed)
  Problem: Nutrition: Goal: Adequate nutrition will be maintained Outcome: Progressing   Problem: Coping: Goal: Level of anxiety will decrease Outcome: Progressing   Problem: Pain Management: Goal: General experience of comfort will improve Outcome: Progressing
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
## 2023-10-24 NOTE — Progress Notes (Addendum)
PROGRESS NOTE   David Duarte, is a 82 y.o. male, DOB - 08/09/1941, ZOX:[id number]  Admit date - 10/22/2023   Admitting Physician Rafiq Bucklin Mariea Clonts, MD  Outpatient Primary MD for the patient is Rakes, Doralee Albino, FNP  LOS - 2  Chief Complaint  Patient presents with   Abdominal Pain      Brief Narrative:   82 y.o. 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 , he has chronic type II endoleak with growth of aneurysm sac from 1 to 1.5 cm over 2 years.  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  He was admitted on 10/22/2023 with persistent acute diverticulitis after failing outpatient Augmentin - 10/24/23 possible discharge home on 10/25/2023 if no increased abdominal pain with advancement of diet and if no bleeding after restarting Eliquis    -Assessment and Plan: 1) acute persistent sigmoid diverticulitis--- patient failed outpatient Augmentin that was started on 10/08/2023 -CT abdomen and pelvis on 10/22/23 consistent with uncomplicated sigmoid diverticulitis -No fevers or leukocytosis, however patient is immunocompromised --Patient reports constipation and weight loss--last colonoscopy February 2022 with finding of diverticulosis , underwent polypectomy consistent with tubular adenomas -GI consult appreciated,  10/24/23 -Denies abdominal pain at rest does have abdominal pain with BMs -Interestingly enough patient also has episodes of gagging and vomiting with clear vomitus each time he has BMs -Has had 3 small mushy brown BMs today --Tolerating  liquid diet well -General Surgery consult appreciated -Will advance diet to solids -Continue Rocephin and Flagyl -Possible discharge on 2015 2024 if tolerates solid food -Outpatient colonoscopy will be required in 6 to 8 weeks  2)History of PE and A-fib on chronic --- hold PTA Eliquis--- due to Hgb dropping compared to baseline -Continue Coreg for rate control 10/24/23 -Stool is Hemoccult positive--discussed with GI team most likely will need outpatient colonoscopy in 6 to 8 weeks -Discussed with GI team Dr. 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 , he has chronic type II endoleak with growth of aneurysm sac from 1 to 1.5 cm over 2 years-- -CT angio of the abdomen and pelvis dated 10/22/2023 shows--No acute abdominopelvic vascular abnormality. 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 No evidence of persistent endoleak. -Continue fenofibrate and atorvastatin --Outpatient follow-up with vascular surgery for surveillance as previously advised  6)Liver cirrhosis with history of prior alcohol use--- LFTs are not  elevated- -no  encephalopathy -Routine surveillance/outpatient monitoring by GI team advised  7)Depression/Dementia--- stable, continue Aricept and Lexapro  8)GERD--continue Protonix  9)HTN--stable, continue Coreg and lisinopril  10)DM2-A1c is 4.9 reflecting excellent diabetic control PTA -Use Novolog/Humalog Sliding scale insulin with Accu-Cheks/Fingersticks as ordered   11) constipation--- had BM on 10/23/2023 with laxatives -Apparently is intolerant to Linzess (gets bad diarrhea)  Disposition----possible discharge home on 10/25/2023 if no increased abdominal pain with advancement of diet and if no bleeding after restarting Eliquis  Status is: Inpatient  Dispo: The patient is from: Home              Anticipated d/c is to: Home  Status is: Inpatient   Disposition: The patient is from: Home              Anticipated d/c is to: Home              Anticipated d/c date is: 1 day              Patient currently is not medically stable to d/c. Barriers: Not Clinically Stable-   Code Status :  -  Code Status: Full Code   Family Communication:    NA (patient is alert, awake and coherent)   DVT Prophylaxis  :   - SCDs   SCDs Start: 10/22/23 1538 Place TED hose Start: 10/22/23 1538   Lab Results  Component Value Date   PLT 171 10/24/2023   Inpatient Medications  Scheduled Meds:  atorvastatin  20 mg Oral q morning   carvedilol  3.125 mg Oral BID WC   donepezil  10 mg Oral q morning   escitalopram  20 mg Oral q morning   feeding supplement  237 mL Oral BID BM   fenofibrate  160 mg Oral Daily   furosemide  20 mg Oral Daily   insulin aspart  0-5 Units Subcutaneous QHS   insulin aspart  0-6 Units Subcutaneous TID WC   lisinopril  5 mg Oral Daily   pantoprazole  40 mg Oral Daily   polyethylene glycol  17 g Oral BID   senna-docusate  2 tablet Oral QHS   sodium chloride flush  3 mL Intravenous Q12H   sodium chloride flush  3 mL Intravenous Q12H   Continuous Infusions:  cefTRIAXone  (ROCEPHIN)  IV 2 g (10/24/23 1331)   metronidazole 500 mg (10/24/23 0837)   PRN Meds:.acetaminophen **OR** acetaminophen, bisacodyl, HYDROmorphone (DILAUDID) injection, ondansetron **OR** ondansetron (ZOFRAN) IV, oxyCODONE, polyethylene glycol, sodium chloride flush, traZODone   Anti-infectives (From admission, onward)    Start     Dose/Rate Route Frequency Ordered Stop   10/23/23 1400  cefTRIAXone (ROCEPHIN) 2 g in sodium chloride 0.9 % 100 mL IVPB        2 g 200 mL/hr over 30 Minutes Intravenous Every 24 hours 10/22/23 1422 10/28/23 1359   10/22/23 2000  metroNIDAZOLE (FLAGYL) IVPB 500 mg        500 mg 100 mL/hr over 60 Minutes Intravenous Every 12 hours 10/22/23 1422 10/27/23 1959   10/22/23 1415  cefTRIAXone (ROCEPHIN) 2 g in sodium chloride 0.9 % 100 mL IVPB       Placed in "And" Linked Group   2 g 200 mL/hr over 30 Minutes Intravenous  Once 10/22/23 1409 10/22/23 1449   10/22/23 1415  metroNIDAZOLE (FLAGYL) IVPB 500 mg       Placed in "And" Linked Group   500 mg 100 mL/hr over 60 Minutes Intravenous  Once 10/22/23  1409 10/22/23 1753        Subjective: Lona Millard today has no fevers,  No chest pain,   - -Denies abdominal pain at rest does have abdominal pain with BMs -Interestingly enough patient also has episodes of gagging and vomiting with clear vomitus each time he has BMs -Has had 3 small mushy brown BMs today --Tolerating liquid diet well  Objective: Vitals:   10/23/23 2232 10/24/23 0130 10/24/23 0434 10/24/23 0815  BP: 128/73 138/82 (!) 112/57 (!) 122/55  Pulse: 69 65 73   Resp: 18 18 18    Temp: 98.5 F (36.9 C) 98.2 F (36.8 C) 98.1 F (36.7 C)   TempSrc: Oral Oral    SpO2: 98% 98% 96%   Weight:      Height:        Intake/Output Summary (Last 24 hours) at 10/24/2023 1427 Last data filed at 10/24/2023 0900 Gross per 24 hour  Intake 830.73 ml  Output --  Net 830.73 ml   Filed Weights   10/22/23 1520  Weight: 94.3 kg    Physical  Exam  Gen:- Awake Alert,  in no apparent distress  HEENT:- [place].AT, No sclera icterus Neck-Supple Neck,No JVD,.  Lungs-  CTAB , fair symmetrical air movement CV- S1, S2 normal, regular  Abd-  +ve B.Sounds, Abd Soft, no significant abdominal tenderness at this time    Extremity/Skin:- No  edema, pedal pulses present  Psych-affect is appropriate, oriented x3 Neuro-no new focal deficits, no tremors  Data Reviewed: I have personally reviewed following labs and imaging studies  CBC: Recent Labs  Lab 10/22/23 1013 10/23/23 0416 10/24/23 0450  WBC 8.9 9.9 9.5  NEUTROABS 5.5  --   --   HGB 7.9* 7.1* 9.2*  HCT 27.0* 25.1* 31.3*  MCV 102.3* 104.6* 102.6*  PLT 164 155 171   Basic Metabolic Panel: Recent Labs  Lab 10/22/23 1013 10/23/23 0416  NA 136 138  K 3.3* 3.7  CL 101 104  CO2 25 24  GLUCOSE 172* 137*  BUN 13 12  CREATININE 0.95 0.84  CALCIUM 8.6* 8.6*  MG 1.9  --    GFR: Estimated Creatinine Clearance: 82.2 mL/min (by C-G formula based on SCr of 0.84 mg/dL). Liver Function Tests: Recent Labs  Lab 10/22/23 1013  AST 11*  ALT 6  ALKPHOS 42  BILITOT 1.2*  PROT 7.4  ALBUMIN 3.4*   HbA1C: Recent Labs    10/22/23 1013  HGBA1C 4.3*   Radiology Studies: No results found.  Scheduled Meds:  atorvastatin  20 mg Oral q morning   carvedilol  3.125 mg Oral BID WC   donepezil  10 mg Oral q morning   escitalopram  20 mg Oral q morning   feeding supplement  237 mL Oral BID BM   fenofibrate  160 mg Oral Daily   furosemide  20 mg Oral Daily   insulin aspart  0-5 Units Subcutaneous QHS   insulin aspart  0-6 Units Subcutaneous TID WC   lisinopril  5 mg Oral Daily   pantoprazole  40 mg Oral Daily   polyethylene glycol  17 g Oral BID   senna-docusate  2 tablet Oral QHS   sodium chloride flush  3 mL Intravenous Q12H   sodium chloride flush  3 mL Intravenous Q12H   Continuous Infusions:  cefTRIAXone (ROCEPHIN)  IV 2 g (10/24/23 1331)   metronidazole 500 mg (10/24/23  0837)    LOS: 2 days   Shon Hale M.D on 10/24/2023 at 2:27  PM  Go to [email or web address] - for contact info  [facility] - Office  [phone]  If 7PM-7AM, please contact night-coverage [email or web address] 10/24/2023, 2:27 PM

## 2023-10-24 NOTE — Progress Notes (Signed)
Has been ambulating to bathroom and has had 3 small, mushy, brown BM's.  When having bms gags and vomits clear./white small amount of emesis which wife reports this is his normal.  This is the only time he reports pain.  Ate potato soup and drank orange juice for lunch. Wife at bedside.

## 2023-10-24 NOTE — Progress Notes (Signed)
Gastroenterology & Hepatology   Interval History: Patient is seen this morning.  He is in good spirits denies any abdominal pain.  Reports he is having bowel movements which are brown in color and is feeling much better now  Inpatient Medications:  Current Facility-Administered Medications:    acetaminophen (TYLENOL) tablet 650 mg, 650 mg, Oral, Q6H PRN, 650 mg at 10/23/23 0945 **OR** acetaminophen (TYLENOL) suppository 650 mg, 650 mg, Rectal, Q6H PRN, Emokpae, Courage, MD   atorvastatin (LIPITOR) tablet 20 mg, 20 mg, Oral, q morning, Emokpae, Courage, MD, 20 mg at 10/24/23 0829   bisacodyl (DULCOLAX) suppository 10 mg, 10 mg, Rectal, Daily PRN, Mariea Clonts, Courage, MD   carvedilol (COREG) tablet 3.125 mg, 3.125 mg, Oral, BID WC, Emokpae, Courage, MD, 3.125 mg at 10/24/23 0828   cefTRIAXone (ROCEPHIN) 2 g in sodium chloride 0.9 % 100 mL IVPB, 2 g, Intravenous, Q24H, Emokpae, Courage, MD, Last Rate: 200 mL/hr at 10/23/23 1400, 2 g at 10/23/23 1400   donepezil (ARICEPT) tablet 10 mg, 10 mg, Oral, q morning, Emokpae, Courage, MD, 10 mg at 10/24/23 0829   escitalopram (LEXAPRO) tablet 20 mg, 20 mg, Oral, q morning, Emokpae, Courage, MD, 20 mg at 10/24/23 0827   feeding supplement (ENSURE ENLIVE / ENSURE PLUS) liquid 237 mL, 237 mL, Oral, BID BM, Emokpae, Courage, MD, 237 mL at 10/23/23 1510   fenofibrate tablet 160 mg, 160 mg, Oral, Daily, Emokpae, Courage, MD, 160 mg at 10/24/23 0830   furosemide (LASIX) tablet 20 mg, 20 mg, Oral, Daily, Emokpae, Courage, MD, 20 mg at 10/24/23 0831   HYDROmorphone (DILAUDID) injection 0.5 mg, 0.5 mg, Intravenous, Q3H PRN, Mariea Clonts, Courage, MD, 0.5 mg at 10/23/23 0056   insulin aspart (novoLOG) injection 0-5 Units, 0-5 Units, Subcutaneous, QHS, Emokpae, Courage, MD   insulin aspart (novoLOG) injection 0-6 Units, 0-6 Units, Subcutaneous, TID WC, Emokpae, Courage, MD   lisinopril (ZESTRIL) tablet 5 mg, 5 mg, Oral, Daily, Emokpae, Courage, MD, 5 mg at 10/24/23 0828    metroNIDAZOLE (FLAGYL) IVPB 500 mg, 500 mg, Intravenous, Q12H, Emokpae, Courage, MD, Last Rate: 100 mL/hr at 10/24/23 0837, 500 mg at 10/24/23 0837   ondansetron (ZOFRAN) tablet 4 mg, 4 mg, Oral, Q6H PRN **OR** ondansetron (ZOFRAN) injection 4 mg, 4 mg, Intravenous, Q6H PRN, Emokpae, Courage, MD   oxyCODONE (Oxy IR/ROXICODONE) immediate release tablet 5 mg, 5 mg, Oral, Q4H PRN, Emokpae, Courage, MD, 5 mg at 10/23/23 2357   pantoprazole (PROTONIX) EC tablet 40 mg, 40 mg, Oral, Daily, Emokpae, Courage, MD, 40 mg at 10/24/23 0827   polyethylene glycol (MIRALAX / GLYCOLAX) packet 17 g, 17 g, Oral, BID, Emokpae, Courage, MD, 17 g at 10/23/23 2233   polyethylene glycol (MIRALAX / GLYCOLAX) packet 17 g, 17 g, Oral, Daily PRN, Emokpae, Courage, MD   senna-docusate (Senokot-S) tablet 2 tablet, 2 tablet, Oral, QHS, Emokpae, Courage, MD, 2 tablet at 10/23/23 2233   sodium chloride flush (NS) 0.9 % injection 3 mL, 3 mL, Intravenous, Q12H, Emokpae, Courage, MD, 3 mL at 10/23/23 0921   sodium chloride flush (NS) 0.9 % injection 3 mL, 3 mL, Intravenous, Q12H, Emokpae, Courage, MD, 3 mL at 10/24/23 0837   sodium chloride flush (NS) 0.9 % injection 3 mL, 3 mL, Intravenous, PRN, Emokpae, Courage, MD   traZODone (DESYREL) tablet 50 mg, 50 mg, Oral, QHS PRN, Mariea Clonts, Courage, MD   I/O    Intake/Output Summary (Last 24 hours) at 10/24/2023 1058 Last data filed at 10/24/2023 0900 Gross per 24 hour  Intake 1310.73  ml  Output 400 ml  Net 910.73 ml     Physical Exam: Temp:  [97.4 F (36.3 C)-98.6 F (37 C)] 98.1 F (36.7 C) (12/14 0434) Pulse Rate:  [65-85] 73 (12/14 0434) Resp:  [17-20] 18 (12/14 0434) BP: (112-140)/(55-85) 122/55 (12/14 0815) SpO2:  [96 %-100 %] 96 % (12/14 0434)  Temp (24hrs), Avg:98.2 F (36.8 C), Min:97.4 F (36.3 C), Max:98.6 F (37 C)  GENERAL: NADLUNGS: Clear to auscultation. No presence of rhonchi/wheezing/rales. Adequate chest expansion HEART: RRR, normal s1 and s2. ABDOMEN:  Soft, Non tenderr, no guarding, no peritoneal signs, and nondistended. BS +. No masses.  Laboratory Data: CBC:     Component Value Date/Time   WBC 9.5 10/24/2023 0450   RBC 3.05 (L) 10/24/2023 0450   HGB 9.2 (L) 10/24/2023 0450   HGB 9.0 (L) 09/25/2023 1201   HCT 31.3 (L) 10/24/2023 0450   HCT 31.4 (L) 09/25/2023 1201   PLT 171 10/24/2023 0450   PLT 185 09/25/2023 1201   MCV 102.6 (H) 10/24/2023 0450   MCV 102 (H) 09/25/2023 1201   MCH 30.2 10/24/2023 0450   MCHC 29.4 (L) 10/24/2023 0450   RDW 21.0 (H) 10/24/2023 0450   RDW 17.0 (H) 09/25/2023 1201   LYMPHSABS 0.7 10/22/2023 1013   LYMPHSABS 2.5 09/25/2023 1201   MONOABS 2.4 (H) 10/22/2023 1013   EOSABS 0.0 10/22/2023 1013   EOSABS 0.0 09/25/2023 1201   BASOSABS 0.0 10/22/2023 1013   BASOSABS 0.2 09/25/2023 1201   COAG:  Lab Results  Component Value Date   INR 1.2 09/02/2022   INR 1.0 04/30/2022   INR 1.0 12/18/2020    BMP:     Latest Ref Rng & Units 10/23/2023    4:16 AM 10/22/2023   10:13 AM 10/08/2023   12:08 PM  BMP  Glucose 70 - 99 mg/dL 161  096  045   BUN 8 - 23 mg/dL 12  13  12    Creatinine 0.61 - 1.24 mg/dL 4.09  8.11  9.14   Sodium 135 - 145 mmol/L 138  136  137   Potassium 3.5 - 5.1 mmol/L 3.7  3.3  3.2   Chloride 98 - 111 mmol/L 104  101  101   CO2 22 - 32 mmol/L 24  25  25    Calcium 8.9 - 10.3 mg/dL 8.6  8.6  9.2     HEPATIC:     Latest Ref Rng & Units 10/22/2023   10:13 AM 10/08/2023   12:08 PM 09/25/2023   12:01 PM  Hepatic Function  Total Protein 6.5 - 8.1 g/dL 7.4  8.7  7.3   Albumin 3.5 - 5.0 g/dL 3.4  4.0  4.2   AST 15 - 41 U/L 11  11  7    ALT 0 - 44 U/L 6  6  3    Alk Phosphatase 38 - 126 U/L 42  50  66   Total Bilirubin <1.2 mg/dL 1.2  1.7  1.1     CARDIAC:  Lab Results  Component Value Date   TROPONINI <0.03 01/22/2017      Imaging: I personally reviewed and interpreted the available labs, imaging and endoscopic files.   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  Patient is admitted with diverticulitis   #Abdominal pain ;  recurrent diverticulitis #Positive FOBT  #Cirrhosis or atleast advance fibrosis  #Chronic Constipation   Patient has anemia hence FOBT was done which is positive twice.  Patient does not have any overt signs of bleeding such as melena or hematochezia hence no urgent endoscopy evaluation is indicated. Appropriate transfusion response with 2 units (7-->9.0) also indicates the patient is not actively bleeding  Anemia could also be part of myelodysplastic syndrome for which patient is following with hematology  Last upper endoscopy 2024 status post Elease Hashimoto dilation for dysphagia Colonoscopy February 2022 with 2 subcentimeter Ta's Capsule endoscopy done in December 2023 with few small gastric erosions and possible GI erosions  Today on exam patient has benign abdominal exam.  Reports no further abdominal pain after having bowel movements.  I assume abdominal pain was multifactorial with underlying chronic constipation and diverticulitis which is being treated with IV antibiotics  -Continue IV antibiotics  -Continue current bowel regimen inpatient and as outpatient upon discharge  -Can consider repeat colonoscopy as outpatient after 6 to 8 weeks as patient is currently being treated for diverticulitis  -Would recommend every 6 months HCC screening with ultrasound given questionable history of cirrhosis  Vista Lawman, MD Gastroenterology and Hepatology [facility]   This chart has been completed using [facility] Dictation software, and while attempts have been made to ensure accuracy , certain words and phrases may not be  transcribed as intended

## 2023-10-25 ENCOUNTER — Inpatient Hospital Stay (HOSPITAL_COMMUNITY): Payer: Medicare Other

## 2023-10-25 DIAGNOSIS — K5732 Diverticulitis of large intestine without perforation or abscess without bleeding: Secondary | ICD-10-CM | POA: Diagnosis not present

## 2023-10-25 LAB — BASIC METABOLIC PANEL
Anion gap: 10 (ref 5–15)
BUN: 12 mg/dL (ref 8–23)
CO2: 24 mmol/L (ref 22–32)
Calcium: 8.4 mg/dL — ABNORMAL LOW (ref 8.9–10.3)
Chloride: 102 mmol/L (ref 98–111)
Creatinine, Ser: 0.85 mg/dL (ref 0.61–1.24)
GFR, Estimated: >60 mL/min (ref >60)
Glucose, Bld: 120 mg/dL — ABNORMAL HIGH (ref 70–99)
Potassium: 2.8 mmol/L — ABNORMAL LOW (ref 3.5–5.1)
Sodium: 136 mmol/L (ref 135–145)

## 2023-10-25 LAB — HEMOGLOBIN AND HEMATOCRIT, BLOOD
HCT: 29.3 % — ABNORMAL LOW (ref 39.0–52.0)
Hemoglobin: 8.7 g/dL — ABNORMAL LOW (ref 13.0–17.0)

## 2023-10-25 LAB — CBC
HCT: 26 % — ABNORMAL LOW (ref 39.0–52.0)
Hemoglobin: 7.8 g/dL — ABNORMAL LOW (ref 13.0–17.0)
MCH: 30.2 pg (ref 26.0–34.0)
MCHC: 30 g/dL (ref 30.0–36.0)
MCV: 100.8 fL — ABNORMAL HIGH (ref 80.0–100.0)
Platelets: 138 10*3/uL — ABNORMAL LOW (ref 150–400)
RBC: 2.58 MIL/uL — ABNORMAL LOW (ref 4.22–5.81)
RDW: 19.9 % — ABNORMAL HIGH (ref 11.5–15.5)
WBC: 5 10*3/uL (ref 4.0–10.5)
nRBC: 0 % (ref 0.0–0.2)

## 2023-10-25 LAB — GLUCOSE, CAPILLARY
Glucose-Capillary: 123 mg/dL — ABNORMAL HIGH (ref 70–99)
Glucose-Capillary: 119 mg/dL — ABNORMAL HIGH (ref 70–99)
Glucose-Capillary: 148 mg/dL — ABNORMAL HIGH (ref 70–99)
Glucose-Capillary: 123 mg/dL — ABNORMAL HIGH (ref 70–99)

## 2023-10-25 MED ORDER — POTASSIUM CHLORIDE CRYS ER 20 MEQ PO TBCR
40.0000 meq | EXTENDED_RELEASE_TABLET | Freq: Once | ORAL | Status: AC
  Administered 2023-10-25: 40 meq via ORAL
  Filled 2023-10-25: qty 2

## 2023-10-25 MED ORDER — METRONIDAZOLE 500 MG PO TABS
500.0000 mg | ORAL_TABLET | Freq: Two times a day (BID) | ORAL | Status: DC
  Administered 2023-10-25 – 2023-10-26 (×2): 500 mg via ORAL
  Filled 2023-10-25 (×2): qty 1

## 2023-10-25 MED ORDER — APIXABAN 5 MG PO TABS
5.0000 mg | ORAL_TABLET | Freq: Two times a day (BID) | ORAL | Status: DC
  Administered 2023-10-25 – 2023-10-26 (×2): 5 mg via ORAL
  Filled 2023-10-25 (×2): qty 1

## 2023-10-25 MED ORDER — CEFDINIR 300 MG PO CAPS
300.0000 mg | ORAL_CAPSULE | Freq: Two times a day (BID) | ORAL | 0 refills | Status: AC
Start: 2023-10-25 — End: 2023-10-30

## 2023-10-25 MED ORDER — SENNOSIDES-DOCUSATE SODIUM 8.6-50 MG PO TABS: 2.0000 | ORAL_TABLET | Freq: Every day | ORAL | 1 refills | Status: DC

## 2023-10-25 MED ORDER — METRONIDAZOLE 500 MG PO TABS
500.0000 mg | ORAL_TABLET | Freq: Once | ORAL | Status: AC
  Administered 2023-10-25: 500 mg via ORAL
  Filled 2023-10-25: qty 1

## 2023-10-25 MED ORDER — POTASSIUM CHLORIDE CRYS ER 20 MEQ PO TBCR
40.0000 meq | EXTENDED_RELEASE_TABLET | ORAL | Status: AC
  Administered 2023-10-25 (×2): 40 meq via ORAL
  Filled 2023-10-25 (×2): qty 2

## 2023-10-25 MED ORDER — POLYETHYLENE GLYCOL 3350 17 G PO PACK
17.0000 g | PACK | Freq: Two times a day (BID) | ORAL | 2 refills | Status: DC
Start: 2023-10-25 — End: 2023-12-20

## 2023-10-25 MED ORDER — METRONIDAZOLE 500 MG PO TABS: 500.0000 mg | ORAL_TABLET | Freq: Three times a day (TID) | ORAL | 0 refills | Status: AC

## 2023-10-25 NOTE — Discharge Summary (Signed)
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 8. Aortic atherosclerosis. Aortic Atherosclerosis (ICD10-I70.0). Electronically Signed   By: Gaylyn Rong M.D.   On: 10/08/2023 15:10    Today   Subjective    Lona Millard today has no No fever  Or chills   Denies abdominal pain at rest, but continues to have abdominal discomfort when he has BMs -Interestingly enough patient also has episodes of gagging and vomiting with clear vomitus at times when he has BMs -Continues to have brown stool     Patient was discharged on 10/25/2023, but patient is appealing/protesting his discharge at this time -24 hours Medicaid notification of discharge  given to patient on 10/25/2023  Plan of care Discussed with pt's wife [name] who is at bedside today , questions answered   Patient has been seen and examined prior to discharge   Objective   Blood pressure 115/78, pulse (!) 59, temperature 98.6 F (37 C), resp. rate 19, height 6' (1.829 m), weight 94.3 kg, SpO2 94%.   Intake/Output Summary (Last 24 hours) at 10/25/2023 1640 Last data filed at 10/24/2023 1840 Gross per 24 hour  Intake 237 ml  Output --  Net 237 ml    Exam Gen:- Awake Alert, no acute distress  HEENT:- [place].AT, No sclera icterus Neck-Supple Neck,No JVD,.  Lungs-  CTAB , good air movement bilaterally CV- S1, S2 normal, regular Abd-  +ve B.Sounds, Abd Soft, No tenderness,    Extremity/Skin:- No  edema,  good pulses Psych-affect is appropriate, oriented x3 Neuro-no new focal deficits, no tremors    Data Review   CBC w Diff:  Lab Results  Component Value Date   WBC 5.0 10/25/2023   HGB 8.7 (L) 10/25/2023   HGB 9.0 (L) 09/25/2023   HCT 29.3 (L) 10/25/2023   HCT 31.4 (L) 09/25/2023   PLT 138 (L) 10/25/2023   PLT 185 09/25/2023   LYMPHOPCT 8 10/22/2023   BANDSPCT 1 10/22/2023   MONOPCT 27 10/22/2023   EOSPCT 0 10/22/2023   BASOPCT 0 10/22/2023    CMP:  Lab Results  Component Value Date   NA 136 10/25/2023   NA 141 09/25/2023   K 2.8 (L) 10/25/2023   CL 102 10/25/2023   CO2 24 10/25/2023   BUN 12 10/25/2023   BUN 11 09/25/2023   CREATININE 0.85 10/25/2023   CREATININE 0.95 07/22/2022   PROT 7.4 10/22/2023   PROT 7.3 09/25/2023   ALBUMIN 3.4 (L) 10/22/2023   ALBUMIN 4.2 09/25/2023   BILITOT 1.2 (H) 10/22/2023   BILITOT 1.1 09/25/2023   ALKPHOS 42 10/22/2023   AST 11 (L) 10/22/2023   ALT 6 10/22/2023  .  Total Discharge time is about 33 minutes  Shon Hale M.D on 10/25/2023 at 4:40 PM  Go to [email or web address] -  for contact info  [facility] - Office  [phone]

## 2023-10-25 NOTE — Discharge Instructions (Signed)
1)You are taking Eliquis/Apixaban which is a blood thinner so please Avoid ibuprofen/Advil/Aleve/Motrin/Goody Powders/Naproxen/BC powders/Meloxicam/Diclofenac/Indomethacin and other Nonsteroidal anti-inflammatory medications as these will make you more likely to bleed and can cause stomach ulcers, can also cause Kidney problems.   2)Repeat CBC Blood Test over the next 3 to 4 days advised  3)Please Follow up with Gastroenterologist Dr. Levon Hedger-- in 2 to 3 weeks----address [address], [address], [place] [address],,[facility] Number [phone]     4) call or return if any concerns about blood in your stool or dark stools

## 2023-10-25 NOTE — Progress Notes (Signed)
Has had multiple small brown bms today, mostly mushy with a few small formed balls.  Patient stated the stools were black but on observation , there were only a few black specks in toilet.   After eating half piece of fish for lunch vomited that up, but did tolerate sausage for breakfast.  For supper ate half hamburger and is tolerating that so far.  Wife going to spend the night.

## 2023-10-25 NOTE — Progress Notes (Signed)
Per Dr. Jayme Cloud, patient is stable for discharge today and spoke with patient and wife concerning his evaluation.  Under medicare, patient is exercising his right to decline discharge for another 24 hours.

## 2023-10-25 NOTE — Progress Notes (Signed)
Patient only reported one bm for this shift.

## 2023-10-25 NOTE — Plan of Care (Signed)
  Problem: Education: Goal: Knowledge of General Education information will improve Description: Including pain rating scale, medication(s)/side effects and non-pharmacologic comfort measures Outcome: Progressing   Problem: Clinical Measurements: Goal: Diagnostic test results will improve Outcome: Progressing   Problem: Nutrition: Goal: Adequate nutrition will be maintained Outcome: Progressing   Problem: Pain Management: Goal: General experience of comfort will improve Outcome: Progressing

## 2023-10-26 ENCOUNTER — Telehealth: Payer: Self-pay | Admitting: Gastroenterology

## 2023-10-26 DIAGNOSIS — K5732 Diverticulitis of large intestine without perforation or abscess without bleeding: Secondary | ICD-10-CM | POA: Diagnosis not present

## 2023-10-26 LAB — HEMOGLOBIN AND HEMATOCRIT, BLOOD
HCT: 26.7 % — ABNORMAL LOW (ref 39.0–52.0)
Hemoglobin: 7.9 g/dL — ABNORMAL LOW (ref 13.0–17.0)

## 2023-10-26 LAB — GLUCOSE, CAPILLARY: Glucose-Capillary: 116 mg/dL — ABNORMAL HIGH (ref 70–99)

## 2023-10-26 LAB — BASIC METABOLIC PANEL
Anion gap: 10 (ref 5–15)
BUN: 13 mg/dL (ref 8–23)
CO2: 22 mmol/L (ref 22–32)
Calcium: 8.5 mg/dL — ABNORMAL LOW (ref 8.9–10.3)
Chloride: 105 mmol/L (ref 98–111)
Creatinine, Ser: 0.85 mg/dL (ref 0.61–1.24)
GFR, Estimated: >60 mL/min (ref >60)
Glucose, Bld: 124 mg/dL — ABNORMAL HIGH (ref 70–99)
Potassium: 3.5 mmol/L (ref 3.5–5.1)
Sodium: 137 mmol/L (ref 135–145)

## 2023-10-26 MED ORDER — CEFDINIR 300 MG PO CAPS
300.0000 mg | ORAL_CAPSULE | Freq: Once | ORAL | Status: AC
  Administered 2023-10-26: 300 mg via ORAL
  Filled 2023-10-26: qty 1

## 2023-10-26 NOTE — Telephone Encounter (Signed)
Mandy:  Can you make appt with me for hospital follow-up in 2-3 weeks? Thanks!

## 2023-10-26 NOTE — Progress Notes (Signed)
PROGRESS NOTE   David Duarte, is a 82 y.o. male, DOB - May 01, 1941, SEG:[id number]  Admit date - 10/22/2023   Admitting Physician David Acklin Mariea Clonts, MD  Outpatient Primary MD for the patient is Duarte, David Albino, FNP  LOS - 4  Chief Complaint  Patient presents with   Abdominal Pain      Brief Narrative:   82 y.o. 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 , he has chronic type II endoleak with growth of aneurysm sac from 1 to 1.5 cm over 2 years.  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  He was admitted on 10/22/2023 with persistent acute diverticulitis after failing outpatient Augmentin - -Patient was discharged on 10/25/2023, but patient is appealing/protesting his discharge at this time -24 hours Medicaid notification of discharge given to patient on 10/25/2023   Plan of care Discussed with pt's wife David Duarte who is at bedside today , questions answered   -on 10/26/23--- patient was seen and reevaluated with patient's wife and GI provider at bedside -Patient remains stable for discharge home -GI provider has made arrangements for patient to follow-up as outpatient -Patient and wife are comfortable with the plan -Discharged home on 10/25/2025  -Please see full discharge summary dictated and dated 10/25/2023    -Assessment and Plan: 1) acute persistent sigmoid diverticulitis--- patient failed outpatient Augmentin that was started on 10/08/2023 -CT abdomen and pelvis on 10/22/23 consistent with uncomplicated sigmoid diverticulitis -No fevers or  leukocytosis, however patient is immunocompromised --Patient reports constipation and weight loss--last colonoscopy February 2022 with finding of diverticulosis , underwent polypectomy consistent with tubular adenomas -Gastroenterology and general surgery consult appreciated,  -Denies abdominal pain at rest, but continues to have abdominal discomfort when he has BMs -Interestingly enough patient also has episodes of gagging and vomiting with clear vomitus at times when he has BMs -Continues to have brown stool WBC 9.9 >>9.9 >>5.0 -Patient remains afebrile -Abdominal x-rays on 10/25/2023 with possible ileus versus enteritis otherwise no acute findings, no obstructive findings --Continue Rocephin and Flagyl -Tolerating solid diet well -Outpatient colonoscopy will be required in 6 to 8 weeks    2)History of PE and A-fib on chronic --- hold PTA Eliquis--- due to Hgb dropping compared to baseline -Continue Coreg for rate control -Stool is Hemoccult positive--discussed with GI team most likely will need outpatient colonoscopy in 6 to 8 weeks -Discussed with GI team Dr. 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 Ellin Duarte advised   4)HypoKalemia--- magnesium WNL  -Replaced potassium    5)History of Aortitis/infrarenal AAA and bilateral common iliac artery aneurysms status post EVAR  in 2022---Underwent endovascular repair of his type II endoleak with IR  (transarterial embolization of type II endoleak involving the IMA and right L3 lumbar artery with Onyx. , he has chronic type II endoleak with growth of aneurysm sac from 1 to 1.5 cm over 2 years-- -CT angio of the abdomen and pelvis dated 10/22/2023 shows--No acute abdominopelvic vascular abnormality. 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 No evidence of persistent endoleak. -Continue fenofibrate and atorvastatin --Outpatient follow-up with vascular surgery for surveillance as previously advised   6)Liver cirrhosis with history of prior alcohol use--- LFTs are not elevated- -no encephalopathy -Routine surveillance/outpatient monitoring by GI team advised   7)Depression/Dementia--- stable, continue Aricept and Lexapro   8)GERD--continue Protonix   9)HTN--stable, continue Coreg and lisinopril   10)DM2-A1c is 4.9 reflecting excellent diabetic control PTA Continue PTA regimen -Avoid prolonged starvation to avoid hypoglycemia   11) constipation--- -continues to have BMs with laxatives -No overt bleeding noted -Apparently is intolerant to Linzess (gets bad diarrhea)   Family communication:-Plan of care Discussed with pt's wife David Duarte who is at bedside today , questions answered  Dispo: The patient is from: Home              d/c is to: Home  Code Status :  -  Code Status: Full Code   DVT Prophylaxis  :   - SCDs   SCDs Start: 10/22/23 1538 Place TED hose Start: 10/22/23 1538 apixaban (ELIQUIS) tablet 5 mg   Lab Results  Component Value Date   PLT 138 (L) 10/25/2023   Inpatient Medications  Scheduled Meds:  apixaban  5 mg Oral BID   atorvastatin  20 mg Oral q morning   carvedilol  3.125 mg Oral BID WC   cefdinir  300 mg Oral Once   donepezil  10 mg Oral q morning   escitalopram  20 mg Oral q morning   feeding supplement  237 mL Oral BID BM   fenofibrate  160 mg Oral Daily   furosemide  20 mg Oral Daily   insulin aspart  0-5 Units Subcutaneous QHS   insulin  aspart  0-6 Units Subcutaneous TID WC   lisinopril  5 mg Oral Daily   metroNIDAZOLE  500 mg Oral BID   pantoprazole  40 mg Oral Daily   polyethylene glycol  17 g Oral BID   senna-docusate  2 tablet Oral QHS   sodium chloride flush  3 mL Intravenous Q12H   sodium chloride flush  3 mL Intravenous Q12H   Continuous Infusions:  PRN Meds:.acetaminophen **OR** acetaminophen, bisacodyl, HYDROmorphone (DILAUDID) injection, ondansetron **OR** ondansetron (ZOFRAN) IV, oxyCODONE, polyethylene glycol, sodium chloride flush, traZODone   Anti-infectives (From admission, onward)    Start     Dose/Rate Route Frequency Ordered Stop   10/26/23 1100  cefdinir (OMNICEF) capsule 300 mg        300 mg Oral  Once 10/26/23 1008     10/25/23 2000  metroNIDAZOLE (FLAGYL) tablet 500 mg        500 mg Oral 2 times daily 10/25/23 1606     10/25/23 1615  metroNIDAZOLE (FLAGYL) tablet 500 mg        500 mg Oral  Once 10/25/23 1522 10/25/23 1753   10/25/23 0000  metroNIDAZOLE (FLAGYL) 500 MG tablet        500 mg Oral 3 times daily 10/25/23 1559 10/30/23 2359   10/25/23 0000  cefdinir (OMNICEF) 300 MG capsule  300 mg Oral 2 times daily 10/25/23 1559 10/30/23 2359   10/23/23 1400  cefTRIAXone (ROCEPHIN) 2 g in sodium chloride 0.9 % 100 mL IVPB  Status:  Discontinued        2 g 200 mL/hr over 30 Minutes Intravenous Every 24 hours 10/22/23 1422 10/26/23 1008   10/22/23 2000  metroNIDAZOLE (FLAGYL) IVPB 500 mg  Status:  Discontinued        500 mg 100 mL/hr over 60 Minutes Intravenous Every 12 hours 10/22/23 1422 10/25/23 1425   10/22/23 1415  cefTRIAXone (ROCEPHIN) 2 g in sodium chloride 0.9 % 100 mL IVPB       Placed in "And" Linked Group   2 g 200 mL/hr over 30 Minutes Intravenous  Once 10/22/23 1409 10/22/23 1449   10/22/23 1415  metroNIDAZOLE (FLAGYL) IVPB 500 mg       Placed in "And" Linked Group   500 mg 100 mL/hr over 60 Minutes Intravenous  Once 10/22/23 1409 10/22/23 1753         Subjective: Lona Millard today has no fevers,  No chest pain,   - Patient was discharged on 10/25/2023, but patient is appealing/protesting his discharge at this time -24 hours Medicaid notification of discharge given to patient on 10/25/2023   Plan of care Discussed with pt's wife David Duarte who is at bedside today , questions answered   -on 10/26/23--- patient was seen and reevaluated with patient's wife and GI provider at bedside -Patient remains stable for discharge home -GI provider has made arrangements for patient to follow-up as outpatient -Patient and wife are comfortable with the plan -Discharged home on 10/25/2025  -Patient is eating and drinking well -Had BMs -No emesis  -Please see full discharge summary dictated and dated 10/25/2023  Objective: Vitals:   10/25/23 1254 10/25/23 2002 10/26/23 0454 10/26/23 0819  BP: 115/78 138/78 (!) 148/74 128/67  Pulse: (!) 59 72 63 62  Resp: 19 18 16 18   Temp:  (!) 97.3 F (36.3 C) 97.9 F (36.6 C)   TempSrc:      SpO2: 94% 99% 97% 100%  Weight:      Height:        Intake/Output Summary (Last 24 hours) at 10/26/2023 1036 Last data filed at 10/26/2023 0851 Gross per 24 hour  Intake 240 ml  Output --  Net 240 ml   Filed Weights   10/22/23 1520  Weight: 94.3 kg    Physical Exam Gen:- Awake Alert,  in no apparent distress  HEENT:- [place].AT, No sclera icterus Neck-Supple Neck,No JVD,.  Lungs-  CTAB , fair symmetrical air movement CV- S1, S2 normal, regular  Abd-  +ve B.Sounds, Abd Soft, no significant abdominal tenderness at this time    Extremity/Skin:- No  edema, pedal pulses present  Psych-affect is appropriate, oriented x3 Neuro-no new focal deficits, no tremors  Data Reviewed: I have personally reviewed following labs and imaging studies  CBC: Recent Labs  Lab 10/22/23 1013 10/23/23 0416 10/24/23 0450 10/25/23 0453 10/25/23 1501 10/26/23 0452  WBC 8.9 9.9 9.5 5.0  --   --   NEUTROABS 5.5  --   --   --    --   --   HGB 7.9* 7.1* 9.2* 7.8* 8.7* 7.9*  HCT 27.0* 25.1* 31.3* 26.0* 29.3* 26.7*  MCV 102.3* 104.6* 102.6* 100.8*  --   --   PLT 164 155 171 138*  --   --    Basic Metabolic Panel: Recent Labs  Lab 10/22/23 1013  10/23/23 0416 10/25/23 0453 10/26/23 0452  NA 136 138 136 137  K 3.3* 3.7 2.8* 3.5  CL 101 104 102 105  CO2 25 24 24 22   GLUCOSE 172* 137* 120* 124*  BUN 13 12 12 13   CREATININE 0.95 0.84 0.85 0.85  CALCIUM 8.6* 8.6* 8.4* 8.5*  MG 1.9  --   --   --    GFR: Estimated Creatinine Clearance: 81.3 mL/min (by C-G formula based on SCr of 0.85 mg/dL). Liver Function Tests: Recent Labs  Lab 10/22/23 1013  AST 11*  ALT 6  ALKPHOS 42  BILITOT 1.2*  PROT 7.4  ALBUMIN 3.4*   HbA1C: No results for input(s): "HGBA1C" in the last 72 hours.  Radiology Studies: DG Abd 2 Views Result Date: 10/25/2023 CLINICAL DATA:  86105 Emesis [id number] EXAM: ABDOMEN - 2 VIEW COMPARISON:  10/22/2023 FINDINGS: Multiple air-fluid levels on upright view. No dilated loops of bowel are seen to suggest obstruction. There is no evidence of free air. No radio-opaque calculi or other significant radiographic abnormality is seen. Aortoiliac stent grafts and prior endovascular coils within the abdomen and pelvis. IMPRESSION: Multiple air-fluid levels on upright view, which may reflect ileus or enteritis. No evidence of bowel obstruction. Electronically Signed   By: Duanne Guess D.O.   On: 10/25/2023 14:08   Scheduled Meds:  apixaban  5 mg Oral BID   atorvastatin  20 mg Oral q morning   carvedilol  3.125 mg Oral BID WC   cefdinir  300 mg Oral Once   donepezil  10 mg Oral q morning   escitalopram  20 mg Oral q morning   feeding supplement  237 mL Oral BID BM   fenofibrate  160 mg Oral Daily   furosemide  20 mg Oral Daily   insulin aspart  0-5 Units Subcutaneous QHS   insulin aspart  0-6 Units Subcutaneous TID WC   lisinopril  5 mg Oral Daily   metroNIDAZOLE  500 mg Oral BID   pantoprazole  40  mg Oral Daily   polyethylene glycol  17 g Oral BID   senna-docusate  2 tablet Oral QHS   sodium chloride flush  3 mL Intravenous Q12H   sodium chloride flush  3 mL Intravenous Q12H   Continuous Infusions:   LOS: 4 days   Shon Hale M.D on 10/26/2023 at 10:36 AM  Go to [email or web address] - for contact info  [facility] - Office  [phone]  If 7PM-7AM, please contact night-coverage [email or web address] 10/26/2023, 10:36 AM

## 2023-10-26 NOTE — Care Management Important Message (Signed)
Important Message  Patient Details  Name: David Duarte MRN: [id number] Date of Birth: Jun 20, 1941   Important Message Given:  Yes - Medicare IM (spoke with wife David Duarte, no additional copy needed)     Corey Harold 10/26/2023, 10:54 AM

## 2023-10-26 NOTE — Plan of Care (Signed)
  Problem: Clinical Measurements: Goal: Ability to maintain clinical measurements within normal limits will improve Outcome: Progressing Goal: Will remain free from infection Outcome: Progressing Goal: Diagnostic test results will improve Outcome: Progressing   Problem: Elimination: Goal: Will not experience complications related to bowel motility Outcome: Progressing   Problem: Pain Management: Goal: General experience of comfort will improve Outcome: Progressing   Problem: Health Behavior/Discharge Planning: Goal: Ability to manage health-related needs will improve Outcome: Progressing

## 2023-10-26 NOTE — Progress Notes (Signed)
  GI asked to see patient, as he was hesitant to be discharged yesterday despite appropriate for discharge.   I spent about 30 minutes with patient and wife, [name], discussing his hospitalization and plan for outpatient. He is well known to me from outpatient appointments.   He notes he has been dealing with chronic abdominal pain. Feels better since admission. He is worried about a blockage. I reviewed CT and xray with him. Clinically not obstructed. Abdomen soft, good bowel sounds. He had a BM that I saw while I as in the room. No blood whatsoever. His discomfort only occurs prior to having a BM. He has had multiple CT documented episodes of diverticulitis this year alone. I suspect we are dealing with a smoldering diverticulitis, +/- could have stricturing due to chronic issues.   Recommend Miralax increased to TID, continue stool softeners. Follow soft diet at discharge. We will see him as outpatient in 2 weeks. Recommend colonoscopy in about 6 weeks. I will refer to surgeon when I see him in 2 weeks just to get him on the books. Regardless, he will need early interval colonoscopy. He may not be an appropriate surgical candidate, unfortunately.   Gelene Mink, PhD, ANP-BC [facility]

## 2023-10-26 NOTE — Progress Notes (Signed)
Patient discharged home with instructions given on medications and follow up visits,patient and family verbalized understanding. Prescriptions sent to Pharmacy of choice documented on AVS. IV discontinued,catheter intact. Accompanied by staff to an awaiting vehicle. 

## 2023-10-26 NOTE — Progress Notes (Signed)
-  Please see full discharge summary dictated and dated 10/25/2023  Shon Hale, MD

## 2023-10-27 ENCOUNTER — Telehealth: Payer: Self-pay

## 2023-10-27 LAB — TYPE AND SCREEN
ABO/RH(D): O POS
Antibody Screen: POSITIVE
Donor AG Type: NEGATIVE
Donor AG Type: NEGATIVE
Unit division: 0
Unit division: 0

## 2023-10-27 LAB — BPAM RBC
Blood Product Expiration Date: 202412272359
ISSUE DATE / TIME: 202412132212
Unit Type and Rh: 5100

## 2023-10-27 NOTE — Transitions of Care (Post Inpatient/ED Visit) (Signed)
10/27/2023  Name: David Duarte MRN: [id number] DOB: 1940-12-17  Today's TOC FU Call Status: Today's TOC FU Call Status:: Successful TOC FU Call Completed TOC FU Call Complete Date: 10/27/23 Patient's Name and Date of Birth confirmed.  Transition Care Management Follow-up Telephone Call Date of Discharge: 10/26/23 [facility] Type of Discharge: Inpatient Admission Primary Inpatient Discharge Diagnosis:: diverticulitis How have you been since you were released from the hospital?: Better Any questions or concerns?: No  Items Reviewed: Did you receive and understand the discharge instructions provided?: No Medications obtained,verified, and reconciled?: Yes (Medications Reviewed) Any new allergies since your discharge?: No Dietary orders reviewed?: Yes Do you have support at home?: Yes People in Home: sibling(s)  Medications Reviewed Today: Medications Reviewed Today     Reviewed by Karena Addison, LPN (Licensed Practical Nurse) on 10/27/23 at [phone]  Med List Status: <None>   Medication Order Taking? Sig Documenting Provider Last Dose Status Informant  acetaminophen (TYLENOL) 500 MG tablet [id number] No Take 1,000 mg by mouth every 6 (six) hours as needed for moderate pain. [provider] Taking Active Spouse/Significant Other, Pharmacy Records, Multiple Informants, Other  atorvastatin (LIPITOR) 20 MG tablet [id number] No Take 1 tablet (20 mg total) by mouth every morning. Sonny Masters, FNP 10/22/2023 Morning Active Spouse/Significant Other, Pharmacy Records, Multiple Informants, Other  Carboxymethylcellul-Glycerin (CLEAR EYES FOR DRY EYES OP) [id number] No Place 1 drop into both eyes daily as needed (dry eyes). [provider] 10/21/2023 Morning Active Spouse/Significant Other, Pharmacy Records, Multiple Informants, Other  carvedilol (COREG) 3.125 MG tablet [id number] No Take 3.125 mg by mouth 2 (two) times daily with a meal. [provider] 10/21/2023 Morning Active Spouse/Significant Other, Pharmacy Records, Multiple Informants, Other  cefdinir (OMNICEF) 300 MG capsule [id number]  Take 1 capsule (300 mg total) by mouth 2 (two) times daily for 5 days. Shon Hale, MD  Active   Choline Fenofibrate (FENOFIBRIC ACID) 135 MG CPDR [id number] No Take 1 capsule by mouth daily. Sonny Masters, FNP 10/21/2023 Morning Active Spouse/Significant Other, Pharmacy Records, Multiple Informants, Other  donepezil (ARICEPT) 10 MG tablet [id number] No Take 1 tablet (10 mg total) by mouth every morning. Sonny Masters, FNP Past Month Morning Active Spouse/Significant Other, Pharmacy Records, Multiple Informants, Other  ELIQUIS 5 MG TABS tablet [id number] No TAKE 1 TABLET TWICE A DAY Sonny Masters, FNP 10/22/2023  8:00 AM Active Spouse/Significant Other, Pharmacy Records, Multiple Informants, Other  escitalopram (LEXAPRO) 20 MG tablet [id number] No Take 1 tablet (20 mg total) by mouth every morning. Sonny Masters, FNP 10/21/2023 Morning Active Spouse/Significant Other, Pharmacy Records, Multiple Informants, Other  furosemide (LASIX) 20 MG tablet [id number] No TAKE 1 TABLET DAILY Sonny Masters, FNP 10/22/2023 Morning Active Spouse/Significant Other, Pharmacy Records, Multiple Informants, Other  glucose blood test strip [id number] No EVERY DAY [provider] Taking Active Spouse/Significant Other, Pharmacy Records, Multiple Informants, Other  HYDROcodone-acetaminophen (NORCO/VICODIN) 5-325 MG tablet [id number] No Take 1 tablet by mouth every 6 (six) hours as needed for moderate pain. Eldred Manges, MD 10/22/2023 Morning Active Spouse/Significant Other, Pharmacy Records, Multiple Informants, Other  hydrocortisone cream 1 % [id number] No Apply to affected area 2 times daily  Patient taking differently: Apply 1 Application topically 2 (two) times daily as needed for itching.   Marita Kansas, PA-C 10/21/2023 Active Spouse/Significant Other,  Pharmacy Records, Multiple Informants, Other  Insulin Pen Needle 32G X 4 MM MISC [id number] No USE AS DIRECTED [provider] Taking Active  Spouse/Significant Other, Pharmacy Records, Multiple Informants, Other  lactulose (CHRONULAC) 10 GM/15ML solution [id number] No Take 45 mLs (30 g total) by mouth daily as needed for mild constipation. Sonny Masters, FNP 10/21/2023 Bedtime Active Spouse/Significant Other, Pharmacy Records, Multiple Informants, Other  lisinopril (ZESTRIL) 5 MG tablet [id number] No TAKE 1 TABLET DAILY Sonny Masters, FNP 10/21/2023 Morning Active Spouse/Significant Other, Pharmacy Records, Multiple Informants, Other  metroNIDAZOLE (FLAGYL) 500 MG tablet [id number]  Take 1 tablet (500 mg total) by mouth 3 (three) times daily for 5 days. Shon Hale, MD  Active   omeprazole (PRILOSEC) 40 MG capsule [id number] No TAKE 1 CAPSULE DAILY Sonny Masters, FNP 10/22/2023 Morning Active Spouse/Significant Other, Pharmacy Records, Multiple Informants, Other  ondansetron (ZOFRAN-ODT) 8 MG disintegrating tablet [id number] No TAKE 1 TABLET BY MOUTH EVERY 8 HOURS AS NEEDED FOR NAUSEA & VOMITING Doreatha Massed, MD 10/22/2023 Morning Active Spouse/Significant Other, Pharmacy Records, Multiple Informants, Other  polyethylene glycol (MIRALAX) 17 g packet [id number]  Take 17 g by mouth 2 (two) times daily. Shon Hale, MD  Active   senna-docusate (SENOKOT-S) 8.6-50 MG tablet [id number]  Take 2 tablets by mouth at bedtime. Shon Hale, MD  Active   TRUEplus Lancets 28G MISC [id number] No Apply 1 each topically 3 (three) times daily. [provider] Taking Active Spouse/Significant Other, Pharmacy Records, Multiple Informants, Other  ULTICARE MICRO PEN NEEDLES 32G X 4 MM MISC [id number] No  [provider] Taking Active Spouse/Significant Other, Pharmacy Records, Multiple Informants, Other            Home Care and Equipment/Supplies: Were Home Health Services  Ordered?: NA Any new equipment or medical supplies ordered?: NA  Functional Questionnaire: Do you need assistance with bathing/showering or dressing?: No Do you need assistance with meal preparation?: No Do you need assistance with eating?: No Do you have difficulty maintaining continence: No Do you need assistance with getting out of bed/getting out of a chair/moving?: No Do you have difficulty managing or taking your medications?: No  Follow up appointments reviewed: PCP Follow-up appointment confirmed?: Yes Date of PCP follow-up appointment?: 10/28/23 Follow-up Provider: [facility] Specialist Hospital Follow-up appointment confirmed?: Yes Date of Specialist follow-up appointment?: 11/13/23 Follow-Up Specialty Provider:: GI Do you need transportation to your follow-up appointment?: No Do you understand care options if your condition(s) worsen?: Yes-patient verbalized understanding    SIGNATURE Karena Addison, LPN [facility] Nurse Health Advisor Direct Dial [phone]

## 2023-10-28 ENCOUNTER — Encounter: Payer: Self-pay | Admitting: Family Medicine

## 2023-10-28 ENCOUNTER — Ambulatory Visit (INDEPENDENT_AMBULATORY_CARE_PROVIDER_SITE_OTHER): Payer: No Typology Code available for payment source | Admitting: Family Medicine

## 2023-10-28 VITALS — BP 119/76 | HR 72 | Temp 97.9°F | Wt 205.0 lb

## 2023-10-28 DIAGNOSIS — K5732 Diverticulitis of large intestine without perforation or abscess without bleeding: Secondary | ICD-10-CM

## 2023-10-28 DIAGNOSIS — N1832 Chronic kidney disease, stage 3b: Secondary | ICD-10-CM

## 2023-10-28 DIAGNOSIS — D631 Anemia in chronic kidney disease: Secondary | ICD-10-CM

## 2023-10-28 NOTE — Progress Notes (Signed)
BP 119/76   Pulse 72   Temp 97.9 F (36.6 C) (Temporal)   Wt 205 lb (93 kg)   SpO2 98%   BMI 27.80 kg/m    Subjective:   Patient ID: David Duarte, male    DOB: Oct 11, 1941, 82 y.o.   MRN: [id number]  HPI: David Duarte is a 82 y.o. male presenting on 10/28/2023 for Hospitalization Follow-up (Weak/)   HPI Patient is coming in today for hospital follow-up. Patient was contacted by her transition of care nurse on 10/27/2023.  He was contacted by Ferdinand Cava, LPN. Patient is coming in today for hospital follow-up/transition of care visit.  He was admitted on 10/22/2023 and discharged on 10/25/2023.  Patient was diagnosed with a flareup of diverticulitis and anemia.  He still somewhat weak and feeling tired.  He denies any blood in his stool.  He has been doing the MiraLAX twice daily for recommended to try and clear out his bowels and now is having a lot of diarrhea as well and just not feeling super well still.  He denies any abdominal pain.  He denies any constipation like he had before this flared up.  He has been treated twice for diverticulitis and in the past month.  He is still taking the antibiotics and has 5 more days of antibiotics.  He does have 6 to 8-week follow-up with gastroenterology outpatient.  Relevant past medical, surgical, family and social history reviewed and updated as indicated. Interim medical history since our last visit reviewed. Allergies and medications reviewed and updated.  Review of Systems  Constitutional:  Negative for chills and fever.  Eyes:  Negative for visual disturbance.  Respiratory:  Negative for shortness of breath and wheezing.   Cardiovascular:  Negative for chest pain and leg swelling.  Gastrointestinal:  Positive for abdominal pain and diarrhea. Negative for constipation, nausea and vomiting.  Skin:  Negative for rash.  All other systems reviewed and are negative.   Per HPI unless specifically indicated  above   Allergies as of 10/28/2023   No Active Allergies      Medication List        Accurate as of October 28, 2023 11:13 AM. If you have any questions, ask your nurse or doctor.          acetaminophen 500 MG tablet Commonly known as: TYLENOL Take 1,000 mg by mouth every 6 (six) hours as needed for moderate pain.   atorvastatin 20 MG tablet Commonly known as: LIPITOR Take 1 tablet (20 mg total) by mouth every morning.   carvedilol 3.125 MG tablet Commonly known as: COREG Take 3.125 mg by mouth 2 (two) times daily with a meal.   cefdinir 300 MG capsule Commonly known as: OMNICEF Take 1 capsule (300 mg total) by mouth 2 (two) times daily for 5 days.   CLEAR EYES FOR DRY EYES OP Place 1 drop into both eyes daily as needed (dry eyes).   donepezil 10 MG tablet Commonly known as: ARICEPT Take 1 tablet (10 mg total) by mouth every morning.   Eliquis 5 MG Tabs tablet Generic drug: apixaban TAKE 1 TABLET TWICE A DAY   escitalopram 20 MG tablet Commonly known as: LEXAPRO Take 1 tablet (20 mg total) by mouth every morning.   Fenofibric Acid 135 MG Cpdr Take 1 capsule by mouth daily.   furosemide 20 MG tablet Commonly known as: LASIX TAKE 1 TABLET DAILY   glucose blood test strip EVERY DAY   HYDROcodone-acetaminophen  5-325 MG tablet Commonly known as: NORCO/VICODIN Take 1 tablet by mouth every 6 (six) hours as needed for moderate pain.   hydrocortisone cream 1 % Apply to affected area 2 times daily What changed:  how much to take how to take this when to take this reasons to take this additional instructions   Insulin Pen Needle 32G X 4 MM Misc USE AS DIRECTED   UltiCare Micro Pen Needles 32G X 4 MM Misc Generic drug: Insulin Pen Needle   lactulose 10 GM/15ML solution Commonly known as: CHRONULAC Take 45 mLs (30 g total) by mouth daily as needed for mild constipation.   lisinopril 5 MG tablet Commonly known as: ZESTRIL TAKE 1 TABLET DAILY    metroNIDAZOLE 500 MG tablet Commonly known as: FLAGYL Take 1 tablet (500 mg total) by mouth 3 (three) times daily for 5 days.   omeprazole 40 MG capsule Commonly known as: PRILOSEC TAKE 1 CAPSULE DAILY   ondansetron 8 MG disintegrating tablet Commonly known as: ZOFRAN-ODT TAKE 1 TABLET BY MOUTH EVERY 8 HOURS AS NEEDED FOR NAUSEA & VOMITING   polyethylene glycol 17 g packet Commonly known as: MiraLax Take 17 g by mouth 2 (two) times daily.   senna-docusate 8.6-50 MG tablet Commonly known as: Senokot-S Take 2 tablets by mouth at bedtime.   TRUEplus Lancets 28G Misc Apply 1 each topically 3 (three) times daily.         Objective:   BP 119/76   Pulse 72   Temp 97.9 F (36.6 C) (Temporal)   Wt 205 lb (93 kg)   SpO2 98%   BMI 27.80 kg/m   Wt Readings from Last 3 Encounters:  10/28/23 205 lb (93 kg)  10/22/23 207 lb 14.4 oz (94.3 kg)  10/08/23 192 lb (87.1 kg)    Physical Exam Vitals and nursing note reviewed.  Constitutional:      General: He is not in acute distress.    Appearance: He is well-developed. He is not diaphoretic.  Eyes:     General: No scleral icterus.    Conjunctiva/sclera: Conjunctivae normal.  Neck:     Thyroid: No thyromegaly.  Cardiovascular:     Rate and Rhythm: Normal rate and regular rhythm.     Heart sounds: Normal heart sounds. No murmur heard. Pulmonary:     Effort: Pulmonary effort is normal. No respiratory distress.     Breath sounds: Normal breath sounds. No wheezing.  Abdominal:     General: Abdomen is flat. Bowel sounds are normal. There is no distension.     Tenderness: There is no abdominal tenderness. There is no right CVA tenderness, left CVA tenderness, guarding or rebound.  Musculoskeletal:        General: Normal range of motion.     Cervical back: Neck supple.  Lymphadenopathy:     Cervical: No cervical adenopathy.  Skin:    General: Skin is warm and dry.     Coloration: Skin is pale.     Findings: No rash.   Neurological:     Mental Status: He is alert and oriented to person, place, and time.     Coordination: Coordination normal.  Psychiatric:        Behavior: Behavior normal.       Assessment & Plan:   Problem List Items Addressed This Visit       Digestive   Sigmoid diverticulitis - Primary   Relevant Orders   CBC with Differential/Platelet   CMP14+EGFR     Genitourinary  Anemia in CKD (chronic kidney disease)   Relevant Orders   CBC with Differential/Platelet   CMP14+EGFR    Recheck blood counts and potassium to see how you are doing and recommended to follow-up with PCP in 2 to 3-week  Patient still looks pale.  He says he is having diarrhea because he take MiraLAX twice a day like I recommended and recommended that he back off to just once a day on the MiraLAX.  He does have colonoscopy scheduled in the future.  He denies any current blood loss in his stool but we will recheck his blood counts.  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  He sees hematology and oncology in 5 days.  He he still has 5 days left of antibiotics Follow up plan: Return if symptoms worsen or fail to improve, for 2-3 weeks anemia.  Counseling provided for all of the vaccine components Orders Placed This Encounter  Procedures   CBC with Differential/Platelet   CMP14+EGFR    Arville Care, MD [facility] 10/28/2023, 11:13 AM

## 2023-10-29 ENCOUNTER — Encounter: Payer: Self-pay | Admitting: Hematology

## 2023-10-29 LAB — CMP14+EGFR
ALT: 3 [IU]/L (ref 0–44)
AST: 11 [IU]/L (ref 0–40)
Albumin: 4 g/dL (ref 3.7–4.7)
Alkaline Phosphatase: 55 [IU]/L (ref 44–121)
BUN/Creatinine Ratio: 8 — ABNORMAL LOW (ref 10–24)
BUN: 8 mg/dL (ref 8–27)
Bilirubin Total: 0.8 mg/dL (ref 0.0–1.2)
CO2: 24 mmol/L (ref 20–29)
Calcium: 8.7 mg/dL (ref 8.6–10.2)
Chloride: 102 mmol/L (ref 96–106)
Creatinine, Ser: 1.04 mg/dL (ref 0.76–1.27)
Globulin, Total: 3.1 g/dL (ref 1.5–4.5)
Glucose: 86 mg/dL (ref 70–99)
Potassium: 3.6 mmol/L (ref 3.5–5.2)
Sodium: 141 mmol/L (ref 134–144)
Total Protein: 7.1 g/dL (ref 6.0–8.5)
eGFR: 72 mL/min/{1.73_m2} (ref >59)

## 2023-10-29 LAB — CBC WITH DIFFERENTIAL/PLATELET
Basophils Absolute: 0.1 10*3/uL (ref 0.0–0.2)
Basos: 1 %
EOS (ABSOLUTE): 0.1 10*3/uL (ref 0.0–0.4)
Eos: 2 %
Hematocrit: 32.8 % — ABNORMAL LOW (ref 37.5–51.0)
Hemoglobin: 10 g/dL — ABNORMAL LOW (ref 13.0–17.7)
Lymphocytes Absolute: 2.1 10*3/uL (ref 0.7–3.1)
Lymphs: 30 %
MCH: 30 pg (ref 26.6–33.0)
MCHC: 30.5 g/dL — ABNORMAL LOW (ref 31.5–35.7)
MCV: 99 fL — ABNORMAL HIGH (ref 79–97)
Monocytes Absolute: 2.1 10*3/uL — ABNORMAL HIGH (ref 0.1–0.9)
Monocytes: 30 %
Neutrophils Absolute: 2.4 10*3/uL (ref 1.4–7.0)
Neutrophils: 34 %
Platelets: 214 10*3/uL (ref 150–450)
RBC: 3.33 x10E6/uL — ABNORMAL LOW (ref 4.14–5.80)
RDW: 17.2 % — ABNORMAL HIGH (ref 11.6–15.4)
WBC: 7.1 10*3/uL (ref 3.4–10.8)

## 2023-10-29 LAB — IMMATURE CELLS
MYELOCYTES: 2 % — ABNORMAL HIGH (ref 0–0)
Metamyelocytes: 1 % — ABNORMAL HIGH (ref 0–0)

## 2023-11-02 ENCOUNTER — Inpatient Hospital Stay (HOSPITAL_BASED_OUTPATIENT_CLINIC_OR_DEPARTMENT_OTHER): Payer: Medicare Other | Admitting: Hematology

## 2023-11-02 ENCOUNTER — Inpatient Hospital Stay: Payer: Medicare Other

## 2023-11-02 VITALS — BP 132/82 | HR 80 | Temp 98.1°F | Resp 18 | Wt 208.2 lb

## 2023-11-02 DIAGNOSIS — D469 Myelodysplastic syndrome, unspecified: Secondary | ICD-10-CM | POA: Diagnosis not present

## 2023-11-02 DIAGNOSIS — Z79899 Other long term (current) drug therapy: Secondary | ICD-10-CM | POA: Diagnosis not present

## 2023-11-02 DIAGNOSIS — D500 Iron deficiency anemia secondary to blood loss (chronic): Secondary | ICD-10-CM

## 2023-11-02 DIAGNOSIS — D631 Anemia in chronic kidney disease: Secondary | ICD-10-CM

## 2023-11-02 DIAGNOSIS — D539 Nutritional anemia, unspecified: Secondary | ICD-10-CM

## 2023-11-02 DIAGNOSIS — C9310 Chronic myelomonocytic leukemia not having achieved remission: Secondary | ICD-10-CM | POA: Diagnosis present

## 2023-11-02 DIAGNOSIS — N1832 Chronic kidney disease, stage 3b: Secondary | ICD-10-CM | POA: Diagnosis present

## 2023-11-02 LAB — CBC WITH DIFFERENTIAL/PLATELET
Abs Immature Granulocytes: 0.25 10*3/uL — ABNORMAL HIGH (ref 0.00–0.07)
Basophils Absolute: 0 10*3/uL (ref 0.0–0.1)
Basophils Relative: 1 %
Eosinophils Absolute: 0.1 10*3/uL (ref 0.0–0.5)
Eosinophils Relative: 1 %
HCT: 31.6 % — ABNORMAL LOW (ref 39.0–52.0)
Hemoglobin: 9.2 g/dL — ABNORMAL LOW (ref 13.0–17.0)
Immature Granulocytes: 4 %
Lymphocytes Relative: 19 %
Lymphs Abs: 1.1 10*3/uL (ref 0.7–4.0)
MCH: 30.2 pg (ref 26.0–34.0)
MCHC: 29.1 g/dL — ABNORMAL LOW (ref 30.0–36.0)
MCV: 103.6 fL — ABNORMAL HIGH (ref 80.0–100.0)
Monocytes Absolute: 2.2 10*3/uL — ABNORMAL HIGH (ref 0.1–1.0)
Monocytes Relative: 36 %
Neutro Abs: 2.4 10*3/uL (ref 1.7–7.7)
Neutrophils Relative %: 39 %
Platelets: 168 10*3/uL (ref 150–400)
RBC: 3.05 MIL/uL — ABNORMAL LOW (ref 4.22–5.81)
RDW: 20.5 % — ABNORMAL HIGH (ref 11.5–15.5)
WBC: 6.1 10*3/uL (ref 4.0–10.5)
nRBC: 0 % (ref 0.0–0.2)

## 2023-11-02 LAB — SAMPLE TO BLOOD BANK

## 2023-11-02 MED ORDER — LUSPATERCEPT-AAMT 75 MG ~~LOC~~ SOLR
175.0000 mg | Freq: Once | SUBCUTANEOUS | Status: AC
  Administered 2023-11-02: 175 mg via SUBCUTANEOUS
  Filled 2023-11-02: qty 3

## 2023-11-02 NOTE — Patient Instructions (Addendum)
[facility] at [facility] Discharge Instructions   You were seen and examined today by Dr. Ellin Saba.  He reviewed the results of your lab work. Your hemoglobin in 9.2 today.   We will proceed with the shot today. Dr. Kirtland Bouchard increased the dose of the shot to help keep your hemoglobin up.   We will see you back in 9 weeks.   Return as scheduled.      Thank you for choosing  Can[facility] Center at [facility] to provide your oncology and hematology care.  To afford each patient quality time with our provider, please arrive at least 15 minutes before your scheduled appointment time.   If you have a lab appointment with the Cancer Center please come in thru the Main Entrance and check in at the main information desk.  You need to re-schedule your appointment should you arrive 10 or more minutes late.  We strive to give you quality time with our providers, and arriving late affects you and other patients whose appointments are after yours.  Also, if you no show three or more times for appointments you may be dismissed from the clinic at the providers discretion.     Again, thank you for choosing [facility].  Our hope is that these requests will decrease the amount of time that you wait before being seen by our physicians.       _____________________________________________________________  Should you have questions after your visit to [facility], please contact our office at [phone] and follow the prompts.  Our office hours are 8:00 a.m. and 4:30 p.m. Monday - Friday.  Please note that voicemails left after 4:00 p.m. may not be returned until the following business day.  We are closed weekends and major holidays.  You do have access to a nurse 24-7, just call the main number to the clinic [phone] and do not press any options, hold on the line and a nurse will answer the phone.    For prescription refill requests, have your  pharmacy contact our office and allow 72 hours.    Due to Covid, you will need to wear a mask upon entering the hospital. If you do not have a mask, a mask will be given to you at the Main Entrance upon arrival. For doctor visits, patients may have 1 support person age 41 or older with them. For treatment visits, patients can not have anyone with them due to social distancing guidelines and our immunocompromised population.

## 2023-11-02 NOTE — Progress Notes (Signed)
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 Normal appendix. Vascular/Lymphatic: Interval coiling of the prior endoleak adjacent to the aorta bi-iliac graft. No obvious current endoleak although today's exam was not performed as a CT angiogram and may lack sensitivity in detecting complications related to the graft. As before, the graft traverses the large native thrombosed aneurysm including common iliac aneurysms. Where not obscured by streak artifact from the coils, the graft lumens appear patent. Atherosclerosis is present, including aortoiliac atherosclerotic  disease. No pathologic adenopathy. Minimal retroperitoneal stranding around the aneurysm although this is been present on prior exams as well. Reproductive: Prostatomegaly. Other: Nonspecific presacral edema. Musculoskeletal: Multilevel lumbar degenerative disc disease. Mild degenerative hip arthropathy bilaterally. IMPRESSION: 1. Acute proximal sigmoid diverticulitis, without extraluminal gas or abscess. 2. Interval endovascular coiling of the prior type 2 endoleak adjacent to the aorta bi-iliac graft. No obvious current endoleak although today's exam was not performed as a CT angiogram and may lack sensitivity in detecting complications related to the graft. 3. Small pericardial effusion. 4. 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 5. Prostatomegaly. 6. Multilevel lumbar degenerative disc disease. 7. Mild degenerative hip arthropathy bilaterally. 8. Aortic atherosclerosis. Aortic Atherosclerosis (ICD10-I70.0). Electronically Signed   By: Gaylyn Rong M.D.   On: 10/08/2023 15:10

## 2023-11-02 NOTE — Patient Instructions (Signed)
 [facility] [place] - A DEPT OF [facility]  Discharge Instructions: Thank you for choosing [facility] to provide your oncology and hematology care.  If you have a lab appointment with the Cancer Center - please note that after April 8th, 2024, all labs will be drawn in the cancer center.  You do not have to check in or register with the main entrance as you have in the past but will complete your check-in in the cancer center.  Wear comfortable clothing and clothing appropriate for easy access to any Portacath or PICC line.   We strive to give you quality time with your provider. You may need to reschedule your appointment if you arrive late (15 or more minutes).  Arriving late affects you and other patients whose appointments are after yours.  Also, if you miss three or more appointments without notifying the office, you may be dismissed from the clinic at the provider's discretion.      For prescription refill requests, have your pharmacy contact our office and allow 72 hours for refills to be completed.    Today you received the following chemotherapy and/or immunotherapy agents reblozyl Luspatercept Injection What is this medication? LUSPATERCEPT (lus PAT er sept) treats low levels of red blood cells (anemia) in the body in people with beta thalassemia or myelodysplastic syndromes. It works by helping the body make more red blood cells. This medicine may be used for other purposes; ask your health care provider or pharmacist if you have questions. COMMON BRAND NAME(S): REBLOZYL What should I tell my care team before I take this medication? They need to know if you have any of these conditions: Have had your spleen removed High blood pressure History of blood clots Tobacco use An unusual or allergic reaction to luspatercept, other medications, foods, dyes, or preservatives Pregnant or trying to get pregnant Breastfeeding How should I use this  medication? This medication is injected under the skin. It is given by your care team in a hospital or clinic setting. Talk to your care team about the use of the medication in children. It is not approved for use in children. Overdosage: If you think you have taken too much of this medicine contact a poison control center or emergency room at once. NOTE: This medicine is only for you. Do not share this medicine with others. What if I miss a dose? Keep appointments for follow-up doses. It is important not to miss your dose. Call your care team if you are unable to keep an appointment. What may interact with this medication? Interactions are not expected. This list may not describe all possible interactions. Give your health care provider a list of all the medicines, herbs, non-prescription drugs, or dietary supplements you use. Also tell them if you smoke, drink alcohol, or use illegal drugs. Some items may interact with your medicine. What should I watch for while using this medication? Your condition will be monitored carefully while you are receiving this medication. You may need blood work done while you are taking this medication. Talk to your care team if you may be pregnant. Serious birth defects can occur if you take this medication during pregnancy. Contraception is recommended while taking this medication. Your care team can help you find the option that works for you. Talk to your care team before breastfeeding. Changes to your treatment plan may be needed. What side effects may I notice from receiving this medication? Side effects  that you should report to your care team as soon as possible: Allergic reactions--skin rash, itching, hives, swelling of the face, lips, tongue, or throat Blood clot--pain, swelling, or warmth in the leg, shortness of breath, chest pain Increase in blood pressure Severe back pain, numbness or weakness of the hands, arms, legs, or feet, loss of coordination,  loss of bowel or bladder control Side effects that usually do not require medical attention (report these to your care team if they continue or are bothersome): Bone pain Dizziness Fatigue Headache Joint pain Muscle pain Stomach pain This list may not describe all possible side effects. Call your doctor for medical advice about side effects. You may report side effects to FDA at 1-800-FDA-1088. Where should I keep my medication? This medication is given in a hospital or clinic. It will not be stored at home. NOTE: This sheet is a summary. It may not cover all possible information. If you have questions about this medicine, talk to your doctor, pharmacist, or health care provider.  2024 Elsevier/Gold Standard (2023-03-27 00:00:00)       To help prevent nausea and vomiting after your treatment, we encourage you to take your nausea medication as directed.  BELOW ARE SYMPTOMS THAT SHOULD BE REPORTED IMMEDIATELY: *FEVER GREATER THAN 100.4 F (38 C) OR HIGHER *CHILLS OR SWEATING *NAUSEA AND VOMITING THAT IS NOT CONTROLLED WITH YOUR NAUSEA MEDICATION *UNUSUAL SHORTNESS OF BREATH *UNUSUAL BRUISING OR BLEEDING *URINARY PROBLEMS (pain or burning when urinating, or frequent urination) *BOWEL PROBLEMS (unusual diarrhea, constipation, pain near the anus) TENDERNESS IN MOUTH AND THROAT WITH OR WITHOUT PRESENCE OF ULCERS (sore throat, sores in mouth, or a toothache) UNUSUAL RASH, SWELLING OR PAIN  UNUSUAL VAGINAL DISCHARGE OR ITCHING   Items with * indicate a potential emergency and should be followed up as soon as possible or go to the Emergency Department if any problems should occur.  Please show the CHEMOTHERAPY ALERT CARD or IMMUNOTHERAPY ALERT CARD at check-in to the Emergency Department and triage nurse.  Should you have questions after your visit or need to cancel or reschedule your appointment, please contact [facility] [place] - A DEPT OF [facility]  [phone]  and follow the prompts.  Office hours are 8:00 a.m. to 4:30 p.m. Monday - Friday. Please note that voicemails left after 4:00 p.m. may not be returned until the following business day.  We are closed weekends and major holidays. You have access to a nurse at all times for urgent questions. Please call the main number to the clinic [phone] and follow the prompts.  For any non-urgent questions, you may also contact your provider using MyChart. We now offer e-Visits for anyone 21 and older to request care online for non-urgent symptoms. For details visit [email or web address].   Also download the MyChart app! Go to the app store, search "MyChart", open the app, select [place], and log in with your MyChart username and password.

## 2023-11-02 NOTE — Progress Notes (Signed)
Patient presents today for follow up with Dr. Ellin Saba and Reblozyl injection. HGB 9.2, platelets 168,000. Vital signs within parameters for treatment.   Message received from A. Anderson RN/ Dr. Ellin Saba to proceed with Reblozyl injection. Dose increased to 1.75 mg/kg per A. Dareen Piano RN / Dr. Ellin Saba.   Reblozyl given today per MD orders. Tolerated infusion without adverse affects. Vital signs stable. No complaints at this time. Discharged from clinic by wheel chair in stable condition. Alert and oriented x 3. F/U with [facility] as scheduled.

## 2023-11-04 ENCOUNTER — Other Ambulatory Visit: Payer: Self-pay

## 2023-11-05 ENCOUNTER — Other Ambulatory Visit: Payer: Self-pay

## 2023-11-13 ENCOUNTER — Ambulatory Visit: Payer: No Typology Code available for payment source

## 2023-11-17 ENCOUNTER — Encounter: Payer: Self-pay | Admitting: Gastroenterology

## 2023-11-17 ENCOUNTER — Ambulatory Visit: Payer: Medicare Other | Admitting: Gastroenterology

## 2023-11-17 VITALS — BP 126/73 | HR 74 | Temp 98.6°F | Ht 72.0 in | Wt 206.8 lb

## 2023-11-17 DIAGNOSIS — K7469 Other cirrhosis of liver: Secondary | ICD-10-CM

## 2023-11-17 DIAGNOSIS — K5732 Diverticulitis of large intestine without perforation or abscess without bleeding: Secondary | ICD-10-CM | POA: Diagnosis not present

## 2023-11-17 MED ORDER — ONDANSETRON HCL 4 MG PO TABS: 4.0000 mg | ORAL_TABLET | Freq: Three times a day (TID) | ORAL | 1 refills | Status: DC | PRN

## 2023-11-17 MED ORDER — LUBIPROSTONE 24 MCG PO CAPS: 24.0000 ug | ORAL_CAPSULE | Freq: Two times a day (BID) | ORAL | 3 refills | Status: DC

## 2023-11-17 NOTE — Patient Instructions (Signed)
 You can advance your diet as tolerated.  I have sent in Amitiza  to take twice a day for constipation. Make sure to take this with food to avoid nausea!  I have also sent in refill for Zofran  to take every 8 hours as needed.  We will need to talk about a colonoscopy at next visit, but I want to make sure your bowel movements are good before we arrange this!  I will see you in 4 weeks!  I enjoyed seeing you again today! I value our relationship and want to provide genuine, compassionate, and quality care. You may receive a survey regarding your visit with me, and I welcome your feedback! Thanks so much for taking the time to complete this. I look forward to seeing you again.      Therisa MICAEL Stager, PhD, ANP-BC [facility]
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
## 2023-11-18 ENCOUNTER — Other Ambulatory Visit: Payer: Self-pay | Admitting: Physician Assistant

## 2023-11-18 ENCOUNTER — Encounter: Payer: Self-pay | Admitting: Hematology

## 2023-11-18 DIAGNOSIS — K7469 Other cirrhosis of liver: Secondary | ICD-10-CM

## 2023-11-18 LAB — COMPREHENSIVE METABOLIC PANEL
ALT: <5 [IU]/L (ref 0–44)
AST: 7 [IU]/L (ref 0–40)
Albumin: 4.1 g/dL (ref 3.7–4.7)
Alkaline Phosphatase: 59 [IU]/L (ref 44–121)
BUN/Creatinine Ratio: 11 (ref 10–24)
BUN: 15 mg/dL (ref 8–27)
Bilirubin Total: 1.1 mg/dL (ref 0.0–1.2)
CO2: 25 mmol/L (ref 20–29)
Calcium: 8.8 mg/dL (ref 8.6–10.2)
Chloride: 103 mmol/L (ref 96–106)
Creatinine, Ser: 1.36 mg/dL — ABNORMAL HIGH (ref 0.76–1.27)
Globulin, Total: 3 g/dL (ref 1.5–4.5)
Glucose: 86 mg/dL (ref 70–99)
Potassium: 3.9 mmol/L (ref 3.5–5.2)
Sodium: 145 mmol/L — ABNORMAL HIGH (ref 134–144)
Total Protein: 7.1 g/dL (ref 6.0–8.5)
eGFR: 52 mL/min/{1.73_m2} — ABNORMAL LOW (ref >59)

## 2023-11-18 LAB — AFP TUMOR MARKER: AFP, Serum, Tumor Marker: <1.8 ng/mL (ref 0.0–6.4)

## 2023-11-18 LAB — PROTIME-INR
INR: 1.2 (ref 0.9–1.2)
Prothrombin Time: 13.5 s — ABNORMAL HIGH (ref 9.1–12.0)

## 2023-11-18 NOTE — Progress Notes (Signed)
 Orders for CMP, INR, and AFP added to upcoming labs per GI (NP Lewie Loron) request.

## 2023-11-23 ENCOUNTER — Other Ambulatory Visit: Payer: Self-pay

## 2023-11-23 ENCOUNTER — Inpatient Hospital Stay: Payer: Medicare Other | Attending: Hematology

## 2023-11-23 ENCOUNTER — Inpatient Hospital Stay: Payer: Medicare Other

## 2023-11-23 ENCOUNTER — Emergency Department (HOSPITAL_COMMUNITY)
Admission: EM | Admit: 2023-11-23 | Discharge: 2023-11-23 | Disposition: A | Discharge status: Home / Self Care | Payer: Medicare Other | Attending: Emergency Medicine | Admitting: Emergency Medicine

## 2023-11-23 ENCOUNTER — Encounter (HOSPITAL_COMMUNITY): Payer: Self-pay | Admitting: Emergency Medicine

## 2023-11-23 ENCOUNTER — Emergency Department (HOSPITAL_COMMUNITY): Payer: Medicare Other

## 2023-11-23 VITALS — BP 120/70 | HR 66 | Temp 97.2°F | Resp 18

## 2023-11-23 DIAGNOSIS — Z794 Long term (current) use of insulin: Secondary | ICD-10-CM | POA: Insufficient documentation

## 2023-11-23 DIAGNOSIS — Z7901 Long term (current) use of anticoagulants: Secondary | ICD-10-CM | POA: Insufficient documentation

## 2023-11-23 DIAGNOSIS — R0789 Other chest pain: Secondary | ICD-10-CM | POA: Insufficient documentation

## 2023-11-23 DIAGNOSIS — K7469 Other cirrhosis of liver: Secondary | ICD-10-CM

## 2023-11-23 DIAGNOSIS — R21 Rash and other nonspecific skin eruption: Secondary | ICD-10-CM | POA: Insufficient documentation

## 2023-11-23 DIAGNOSIS — C9310 Chronic myelomonocytic leukemia not having achieved remission: Secondary | ICD-10-CM | POA: Insufficient documentation

## 2023-11-23 DIAGNOSIS — E119 Type 2 diabetes mellitus without complications: Secondary | ICD-10-CM | POA: Insufficient documentation

## 2023-11-23 DIAGNOSIS — D469 Myelodysplastic syndrome, unspecified: Secondary | ICD-10-CM

## 2023-11-23 DIAGNOSIS — D631 Anemia in chronic kidney disease: Secondary | ICD-10-CM | POA: Insufficient documentation

## 2023-11-23 DIAGNOSIS — D539 Nutritional anemia, unspecified: Secondary | ICD-10-CM

## 2023-11-23 DIAGNOSIS — N1832 Chronic kidney disease, stage 3b: Secondary | ICD-10-CM | POA: Insufficient documentation

## 2023-11-23 DIAGNOSIS — Z79899 Other long term (current) drug therapy: Secondary | ICD-10-CM | POA: Insufficient documentation

## 2023-11-23 DIAGNOSIS — R079 Chest pain, unspecified: Secondary | ICD-10-CM

## 2023-11-23 DIAGNOSIS — D500 Iron deficiency anemia secondary to blood loss (chronic): Secondary | ICD-10-CM

## 2023-11-23 LAB — CBC WITH DIFFERENTIAL/PLATELET
Abs Immature Granulocytes: 0.28 10*3/uL — ABNORMAL HIGH (ref 0.00–0.07)
Basophils Absolute: 0 10*3/uL (ref 0.0–0.1)
Basophils Relative: 1 %
Eosinophils Absolute: 0.1 10*3/uL (ref 0.0–0.5)
Eosinophils Relative: 2 %
HCT: 27.7 % — ABNORMAL LOW (ref 39.0–52.0)
Hemoglobin: 8.3 g/dL — ABNORMAL LOW (ref 13.0–17.0)
Immature Granulocytes: 4 %
Lymphocytes Relative: 18 %
Lymphs Abs: 1.2 10*3/uL (ref 0.7–4.0)
MCH: 31.4 pg (ref 26.0–34.0)
MCHC: 30 g/dL (ref 30.0–36.0)
MCV: 104.9 fL — ABNORMAL HIGH (ref 80.0–100.0)
Monocytes Absolute: 2 10*3/uL — ABNORMAL HIGH (ref 0.1–1.0)
Monocytes Relative: 31 %
Neutro Abs: 3 10*3/uL (ref 1.7–7.7)
Neutrophils Relative %: 44 %
Platelets: 191 10*3/uL (ref 150–400)
RBC: 2.64 MIL/uL — ABNORMAL LOW (ref 4.22–5.81)
RDW: 19.9 % — ABNORMAL HIGH (ref 11.5–15.5)
WBC: 6.7 10*3/uL (ref 4.0–10.5)
nRBC: 0.3 % — ABNORMAL HIGH (ref 0.0–0.2)

## 2023-11-23 LAB — TROPONIN I (HIGH SENSITIVITY)
Troponin I (High Sensitivity): 5 ng/L (ref <18)
Troponin I (High Sensitivity): 5 ng/L (ref <18)

## 2023-11-23 LAB — COMPREHENSIVE METABOLIC PANEL
ALT: 5 U/L (ref 0–44)
AST: 9 U/L — ABNORMAL LOW (ref 15–41)
Albumin: 3.3 g/dL — ABNORMAL LOW (ref 3.5–5.0)
Alkaline Phosphatase: 40 U/L (ref 38–126)
Anion gap: 7 (ref 5–15)
BUN: 15 mg/dL (ref 8–23)
CO2: 26 mmol/L (ref 22–32)
Calcium: 8.4 mg/dL — ABNORMAL LOW (ref 8.9–10.3)
Chloride: 103 mmol/L (ref 98–111)
Creatinine, Ser: 1.04 mg/dL (ref 0.61–1.24)
GFR, Estimated: >60 mL/min (ref >60)
Glucose, Bld: 97 mg/dL (ref 70–99)
Potassium: 3.7 mmol/L (ref 3.5–5.1)
Sodium: 136 mmol/L (ref 135–145)
Total Bilirubin: 1 mg/dL (ref 0.0–1.2)
Total Protein: 7.3 g/dL (ref 6.5–8.1)

## 2023-11-23 LAB — SAMPLE TO BLOOD BANK

## 2023-11-23 LAB — BASIC METABOLIC PANEL
Anion gap: 9 (ref 5–15)
BUN: 15 mg/dL (ref 8–23)
CO2: 24 mmol/L (ref 22–32)
Calcium: 8.7 mg/dL — ABNORMAL LOW (ref 8.9–10.3)
Chloride: 105 mmol/L (ref 98–111)
Creatinine, Ser: 1.05 mg/dL (ref 0.61–1.24)
GFR, Estimated: >60 mL/min (ref >60)
Glucose, Bld: 70 mg/dL (ref 70–99)
Potassium: 4 mmol/L (ref 3.5–5.1)
Sodium: 138 mmol/L (ref 135–145)

## 2023-11-23 LAB — CBC
HCT: 31.3 % — ABNORMAL LOW (ref 39.0–52.0)
Hemoglobin: 9 g/dL — ABNORMAL LOW (ref 13.0–17.0)
MCH: 29.8 pg (ref 26.0–34.0)
MCHC: 28.8 g/dL — ABNORMAL LOW (ref 30.0–36.0)
MCV: 103.6 fL — ABNORMAL HIGH (ref 80.0–100.0)
Platelets: 217 10*3/uL (ref 150–400)
RBC: 3.02 MIL/uL — ABNORMAL LOW (ref 4.22–5.81)
RDW: 20 % — ABNORMAL HIGH (ref 11.5–15.5)
WBC: 6.7 10*3/uL (ref 4.0–10.5)
nRBC: 0.3 % — ABNORMAL HIGH (ref 0.0–0.2)

## 2023-11-23 LAB — PROTIME-INR
INR: 1.6 — ABNORMAL HIGH (ref 0.8–1.2)
Prothrombin Time: 19.3 s — ABNORMAL HIGH (ref 11.4–15.2)

## 2023-11-23 MED ORDER — TRIAMCINOLONE ACETONIDE 0.1 % EX CREA: 1.0000 | TOPICAL_CREAM | Freq: Two times a day (BID) | CUTANEOUS | 0 refills | Status: AC

## 2023-11-23 MED ORDER — HYDROXYZINE HCL 25 MG PO TABS: 25.0000 mg | ORAL_TABLET | Freq: Four times a day (QID) | ORAL | 0 refills | Status: DC

## 2023-11-23 MED ORDER — LUSPATERCEPT-AAMT 75 MG ~~LOC~~ SOLR
175.0000 mg | Freq: Once | SUBCUTANEOUS | Status: AC
Start: 2023-11-23 — End: 2023-11-23
  Administered 2023-11-23: 175 mg via SUBCUTANEOUS
  Filled 2023-11-23: qty 3
  Filled 2023-11-23: qty 3.5

## 2023-11-23 NOTE — Progress Notes (Signed)
 Patient presents today for Reblozyl  injection per providers order.  Vital signs WNL.  Hgb noted to be 8.3.  Patient has no new complaints at this time.  Reblozyl  injection given.  After injection patient started to complain of chest pain.  Vitals stable.  Charge nurse and MD notified.  Patient stated that pain had subsided.  Began to tell patient that if he has anymore chest pain or heart related symptoms that he would need to go to the Emergency Department.  Patient then complained of returning chest pain.  Per MD patient being sent to the ED.  Charge nurse called to alert the ED that patient was on the way.

## 2023-11-23 NOTE — Discharge Instructions (Signed)
 Your Eliquis  may be causing this rash, please follow-up with your PCP, to talk about dosing, and changes in your medication.  Additionally we have prescribed you some steroid cream, use this for maximum of 2 weeks, it will help with this, as well as some itching medicine.  Your chest pain, may have been secondary to your infusion, it may have been a reaction secondary to this.  If it reoccurs please return to the ER, and follow-up with your PCP.

## 2023-11-23 NOTE — ED Provider Notes (Signed)
 [place] EMERGENCY DEPARTMENT AT [facility] Provider Note   CSN: [id number] Arrival date & time: 11/23/23  1237     History  Chief Complaint  Patient presents with   Chest Pain    David Duarte is a 83 y.o. male, infrarenal aortic aneurysm w/graft, and MDS, diabetes, who presents to the ED secondary to chest pain, while receiving his Reblozyl  infusion upstairs at the cancer center.  He states he was repeat receiving his infusion, they turned it off, that he had a sharp chest pain in the middle of his chest, that lasted for few seconds, then came back about 4 to 5 minutes later, lasted for few seconds again.  He denies any abdominal pain, back pain, shortness of breath.  He states that he has not had any chest pain since then.  And this occurred around noon.  Also is concerned because he has had a rash on his abdomen, head and neck for the last 2 months, and thinks it may be related to his Eliquis  use.  He states they increased his Eliquis , couple months ago, after having a patching of his aortic aneurysm,/graft.  He states that since then he has been very itchy, and developed a rash.  Denies any bleeding of the penis, the mouth, or other mucosal membranes.    Home Medications Prior to Admission medications   Medication Sig Start Date End Date Taking? Authorizing Provider  hydrOXYzine  (ATARAX ) 25 MG tablet Take 1 tablet (25 mg total) by mouth every 6 (six) hours. 11/23/23  Yes Havoc Sanluis L, PA  triamcinolone  cream (KENALOG ) 0.1 % Apply 1 Application topically 2 (two) times daily for 10 days. 11/23/23 12/03/23 Yes Addelynn Batte L, PA  acetaminophen  (TYLENOL ) 500 MG tablet Take 1,000 mg by mouth every 6 (six) hours as needed for moderate pain.    [provider]  atorvastatin  (LIPITOR) 20 MG tablet Take 1 tablet (20 mg total) by mouth every morning. 09/25/23   Severa Rock HERO, FNP  Carboxymethylcellul-Glycerin (CLEAR EYES FOR DRY EYES OP) Place 1 drop into both eyes  daily as needed (dry eyes).    [provider]  carvedilol  (COREG ) 3.125 MG tablet Take 3.125 mg by mouth 2 (two) times daily with a meal. 09/10/23   [provider]  Choline Fenofibrate  (FENOFIBRIC ACID ) 135 MG CPDR Take 1 capsule by mouth daily. 09/25/23   Severa Rock HERO, FNP  donepezil  (ARICEPT ) 10 MG tablet Take 1 tablet (10 mg total) by mouth every morning. 09/25/23   Severa Rock HERO, FNP  ELIQUIS  5 MG TABS tablet TAKE 1 TABLET TWICE A DAY 09/07/23   Severa Rock HERO, FNP  escitalopram  (LEXAPRO ) 20 MG tablet Take 1 tablet (20 mg total) by mouth every morning. 09/25/23   Severa Rock HERO, FNP  furosemide  (LASIX ) 20 MG tablet TAKE 1 TABLET DAILY 05/27/23   Severa Rock HERO, FNP  glucose blood test strip EVERY DAY 04/22/18   [provider]  HYDROcodone -acetaminophen  (NORCO/VICODIN) 5-325 MG tablet Take 1 tablet by mouth every 6 (six) hours as needed for moderate pain. 05/04/23   Barbarann Oneil BROCKS, MD  hydrocortisone  cream 1 % Apply to affected area 2 times daily Patient taking differently: Apply 1 Application topically 2 (two) times daily as needed for itching. 03/05/23   Hildegard Loge, PA-C  Insulin  Pen Needle 32G X 4 MM MISC USE AS DIRECTED 01/20/18   [provider]  lisinopril  (ZESTRIL ) 5 MG tablet TAKE 1 TABLET DAILY 05/27/23  Severa Rock HERO, FNP  lubiprostone  (AMITIZA ) 24 MCG capsule Take 1 capsule (24 mcg total) by mouth 2 (two) times daily with a meal. 11/17/23   Shirlean Therisa ORN, NP  ondansetron  (ZOFRAN ) 4 MG tablet Take 1 tablet (4 mg total) by mouth every 8 (eight) hours as needed for nausea or vomiting. 11/17/23   Shirlean Therisa ORN, NP  ondansetron  (ZOFRAN -ODT) 8 MG disintegrating tablet TAKE 1 TABLET BY MOUTH EVERY 8 HOURS AS NEEDED FOR NAUSEA & VOMITING 10/12/23   Rogers Hai, MD  pantoprazole  (PROTONIX ) 40 MG tablet Take 40 mg by mouth daily.    [provider]  polyethylene glycol (MIRALAX ) 17 g packet Take 17 g by mouth 2 (two) times daily. 10/25/23    Pearlean Manus, MD  senna-docusate (SENOKOT-S) 8.6-50 MG tablet Take 2 tablets by mouth at bedtime. 10/25/23 10/24/24  Pearlean Manus, MD  TRUEplus Lancets 28G MISC Apply 1 each topically 3 (three) times daily. 10/04/22   [provider]  DANYA MICRO PEN NEEDLES 32G X 4 MM MISC  05/03/18   [provider]      Allergies    Patient has no active allergies.    Review of Systems   Review of Systems  Respiratory:  Negative for shortness of breath.   Cardiovascular:  Positive for chest pain.  Skin:  Positive for rash.    Physical Exam Updated Vital Signs BP (!) 152/86   Pulse 66   Temp 98.2 F (36.8 C) (Oral)   Resp 16   Ht 6' (1.829 m)   Wt 94 kg   SpO2 97%   BMI 28.11 kg/m  Physical Exam Vitals and nursing note reviewed.  Constitutional:      General: He is not in acute distress.    Appearance: He is well-developed.  HENT:     Head: Normocephalic and atraumatic.  Eyes:     Conjunctiva/sclera: Conjunctivae normal.  Cardiovascular:     Rate and Rhythm: Normal rate and regular rhythm.     Heart sounds: No murmur heard. Pulmonary:     Effort: Pulmonary effort is normal. No respiratory distress.     Breath sounds: Normal breath sounds.  Abdominal:     Palpations: Abdomen is soft.     Tenderness: There is no abdominal tenderness.  Musculoskeletal:        General: No swelling.     Cervical back: Neck supple.  Skin:    General: Skin is warm and dry.     Capillary Refill: Capillary refill takes less than 2 seconds.     Comments: +erythematous papules on posterior neck.  Neurological:     Mental Status: He is alert.  Psychiatric:        Mood and Affect: Mood normal.        ED Results / Procedures / Treatments   Labs (all labs ordered are listed, but only abnormal results are displayed) Labs Reviewed  BASIC METABOLIC PANEL - Abnormal; Notable for the following components:      Result Value   Calcium  8.7 (*)    All other components within  normal limits  CBC - Abnormal; Notable for the following components:   RBC 3.02 (*)    Hemoglobin 9.0 (*)    HCT 31.3 (*)    MCV 103.6 (*)    MCHC 28.8 (*)    RDW 20.0 (*)    nRBC 0.3 (*)    All other components within normal limits  TROPONIN I (HIGH SENSITIVITY)  TROPONIN  I (HIGH SENSITIVITY)    EKG EKG Interpretation Date/Time:  Monday November 23 2023 12:58:57 EST Ventricular Rate:  64 PR Interval:    QRS Duration:  74 QT Interval:  424 QTC Calculation: 437 R Axis:   0  Text Interpretation: Atrial fibrillation Cannot rule out Inferior infarct , age undetermined T wave abnormality, consider lateral ischemia Abnormal ECG Confirmed by Yolande Charleston [phone]) on 11/23/2023 6:11:46 PM  Radiology DG Chest 2 View Result Date: 11/23/2023 CLINICAL DATA:  Chest pain EXAM: CHEST - 2 VIEW COMPARISON:  X-ray 01/22/2017 FINDINGS: The heart size and mediastinal contours are within normal limits. No consolidation, pneumothorax or effusion. No edema. Films are underinflated. Degenerative changes of the spine. Aortic endograft seen at the edge of the imaging field. The visualized skeletal structures are unremarkable. IMPRESSION: No acute cardiopulmonary disease.  Underinflation. Electronically Signed   By: Ranell Bring M.D.   On: 11/23/2023 14:26    Procedures Procedures    Medications Ordered in ED Medications - No data to display  ED Course/ Medical Decision Making/ A&P                                 Medical Decision Making Patient is an 83 year old male, here for chest pain, when getting an infusion at the cancer center.  He has a history of aortic aneurysm, of the infrarenal area, but has no back pain, or abdominal pain, or new bruising.  We obtain troponins, chest x-ray, and EKG for further evaluation.  Additionally he states he has had some itching, scratching, since increasing his Eliquis  dose, to the back of his neck, on his abdomen, see pictures for further details, this has been  ongoing for the last 2 months.  We will give him some triamcinolone  cream, as well as hydroxyzine  to help with the rash.  Amount and/or Complexity of Data Reviewed Labs: ordered.    Details: Troponins x 2 within normal limits Radiology: ordered.    Details: Chest x-ray clear Discussion of management or test interpretation with external provider(s): Discussed with patient, his labs are reassuring, troponins x 2 within normal limits.  His chest x-ray is clear, he is not having any chest pain had just 2 episodes after finishing his infusion.  He states he is never had anything like that before.  Is not having abdominal or back pain.  This may be secondary to his drug, or drug reaction, as this is a possible drug reaction, with the specific drug that he is taking for his cancer.  Advised him to follow-up with his PCP.  Heart score listed above.  Additionally he does have some rash, to the posterior neck, and the abdomen.  Will give him some triamcinolone  cream, as well as hydroxyzine  to help with this.  I instructed him to follow-up with his PCP, for possibly changing his Eliquis  to her Xarelto, or changing the dose, we will hold off on changing this at this time.  Heart score 5.  Return precautions emphasized.  Of note he has also been compliant with his Eliquis  and has not missed any doses.  Risk Prescription drug management.   Final Clinical Impression(s) / ED Diagnoses Final diagnoses:  Rash  Chest pain, unspecified type    Rx / DC Orders ED Discharge Orders          Ordered    triamcinolone  cream (KENALOG ) 0.1 %  2 times daily  11/23/23 1825    hydrOXYzine  (ATARAX ) 25 MG tablet  Every 6 hours        11/23/23 1825              Philippa Lyle CROME, [place] 11/23/23 1829    Yolande Lamar BROCKS, MD 11/25/23 1537

## 2023-11-23 NOTE — ED Notes (Signed)
 Pt with skin tear to right AC after taking the bandage off, cleaned with soap and water, applied non stick dsg with kerlix.

## 2023-11-23 NOTE — ED Triage Notes (Signed)
 Pt comes from cancer center after getting infusion. States chest pain started after medication and have now gone away. SOB when pain would hit. States he has had these infusions before with no pain.

## 2023-11-23 NOTE — Patient Instructions (Signed)
 [facility] [place] - A DEPT OF [facility]  Discharge Instructions: Thank you for choosing [facility] to provide your oncology and hematology care.  If you have a lab appointment with the Cancer Center - please note that after April 8th, 2024, all labs will be drawn in the cancer center.  You do not have to check in or register with the main entrance as you have in the past but will complete your check-in in the cancer center.  Wear comfortable clothing and clothing appropriate for easy access to any Portacath or PICC line.   We strive to give you quality time with your provider. You may need to reschedule your appointment if you arrive late (15 or more minutes).  Arriving late affects you and other patients whose appointments are after yours.  Also, if you miss three or more appointments without notifying the office, you may be dismissed from the clinic at the provider's discretion.      For prescription refill requests, have your pharmacy contact our office and allow 72 hours for refills to be completed.    Today you received the following chemotherapy and/or immunotherapy agents Reblozyl      To help prevent nausea and vomiting after your treatment, we encourage you to take your nausea medication as directed.  BELOW ARE SYMPTOMS THAT SHOULD BE REPORTED IMMEDIATELY: *FEVER GREATER THAN 100.4 F (38 C) OR HIGHER *CHILLS OR SWEATING *NAUSEA AND VOMITING THAT IS NOT CONTROLLED WITH YOUR NAUSEA MEDICATION *UNUSUAL SHORTNESS OF BREATH *UNUSUAL BRUISING OR BLEEDING *URINARY PROBLEMS (pain or burning when urinating, or frequent urination) *BOWEL PROBLEMS (unusual diarrhea, constipation, pain near the anus) TENDERNESS IN MOUTH AND THROAT WITH OR WITHOUT PRESENCE OF ULCERS (sore throat, sores in mouth, or a toothache) UNUSUAL RASH, SWELLING OR PAIN  UNUSUAL VAGINAL DISCHARGE OR ITCHING   Items with * indicate a potential emergency and should be followed up  as soon as possible or go to the Emergency Department if any problems should occur.  Please show the CHEMOTHERAPY ALERT CARD or IMMUNOTHERAPY ALERT CARD at check-in to the Emergency Department and triage nurse.  Should you have questions after your visit or need to cancel or reschedule your appointment, please contact [facility] [place] - A DEPT OF [facility] [phone]  and follow the prompts.  Office hours are 8:00 a.m. to 4:30 p.m. Monday - Friday. Please note that voicemails left after 4:00 p.m. may not be returned until the following business day.  We are closed weekends and major holidays. You have access to a nurse at all times for urgent questions. Please call the main number to the clinic [phone] and follow the prompts.  For any non-urgent questions, you may also contact your provider using MyChart. We now offer e-Visits for anyone 55 and older to request care online for non-urgent symptoms. For details visit [email or web address].   Also download the MyChart app! Go to the app store, search "MyChart", open the app, select [place], and log in with your MyChart username and password.

## 2023-11-24 LAB — AFP TUMOR MARKER: AFP, Serum, Tumor Marker: <1.8 ng/mL (ref 0.0–6.4)

## 2023-12-03 ENCOUNTER — Ambulatory Visit: Payer: Medicare Other | Admitting: Family Medicine

## 2023-12-08 ENCOUNTER — Telehealth: Payer: Self-pay

## 2023-12-08 NOTE — Transitions of Care (Post Inpatient/ED Visit) (Signed)
   12/08/2023  Name: David Duarte MRN: [id number] DOB: 25-Jul-1941  Today's TOC FU Call Status: Today's TOC FU Call Status:: Unsuccessful Call (1st Attempt) Unsuccessful Call (1st Attempt) Date: 12/08/23  Attempted to reach the patient regarding the most recent Inpatient/ED visit.  Follow Up Plan: Additional outreach attempts will be made to reach the patient to complete the Transitions of Care (Post Inpatient/ED visit) call.   Signature Karena Addison, LPN [facility] Nurse Health Advisor Direct Dial [phone]

## 2023-12-12 ENCOUNTER — Other Ambulatory Visit: Payer: Self-pay

## 2023-12-14 ENCOUNTER — Inpatient Hospital Stay: Payer: Medicare Other

## 2023-12-14 ENCOUNTER — Encounter: Payer: Self-pay | Admitting: Hematology

## 2023-12-14 ENCOUNTER — Inpatient Hospital Stay: Payer: Medicare Other | Attending: Hematology

## 2023-12-14 VITALS — BP 116/67 | HR 83 | Temp 97.6°F | Resp 18

## 2023-12-14 DIAGNOSIS — Z79899 Other long term (current) drug therapy: Secondary | ICD-10-CM | POA: Diagnosis not present

## 2023-12-14 DIAGNOSIS — N1832 Chronic kidney disease, stage 3b: Secondary | ICD-10-CM | POA: Insufficient documentation

## 2023-12-14 DIAGNOSIS — D649 Anemia, unspecified: Secondary | ICD-10-CM

## 2023-12-14 DIAGNOSIS — C9310 Chronic myelomonocytic leukemia not having achieved remission: Secondary | ICD-10-CM | POA: Diagnosis present

## 2023-12-14 DIAGNOSIS — D469 Myelodysplastic syndrome, unspecified: Secondary | ICD-10-CM

## 2023-12-14 DIAGNOSIS — D631 Anemia in chronic kidney disease: Secondary | ICD-10-CM | POA: Diagnosis present

## 2023-12-14 LAB — CBC WITH DIFFERENTIAL/PLATELET
Abs Immature Granulocytes: 0 10*3/uL (ref 0.00–0.07)
Basophils Absolute: 0 10*3/uL (ref 0.0–0.1)
Basophils Relative: 0 %
Eosinophils Absolute: 0 10*3/uL (ref 0.0–0.5)
Eosinophils Relative: 0 %
HCT: 26.2 % — ABNORMAL LOW (ref 39.0–52.0)
Hemoglobin: 7.9 g/dL — ABNORMAL LOW (ref 13.0–17.0)
Lymphocytes Relative: 6 %
Lymphs Abs: 0.9 10*3/uL (ref 0.7–4.0)
MCH: 30.6 pg (ref 26.0–34.0)
MCHC: 30.2 g/dL (ref 30.0–36.0)
MCV: 101.6 fL — ABNORMAL HIGH (ref 80.0–100.0)
Monocytes Absolute: 3.8 10*3/uL — ABNORMAL HIGH (ref 0.1–1.0)
Monocytes Relative: 26 %
Neutro Abs: 10.1 10*3/uL — ABNORMAL HIGH (ref 1.7–7.7)
Neutrophils Relative %: 68 %
Platelets: 189 10*3/uL (ref 150–400)
RBC: 2.58 MIL/uL — ABNORMAL LOW (ref 4.22–5.81)
RDW: 18.9 % — ABNORMAL HIGH (ref 11.5–15.5)
WBC: 14.8 10*3/uL — ABNORMAL HIGH (ref 4.0–10.5)
nRBC: 0 % (ref 0.0–0.2)

## 2023-12-14 LAB — SAMPLE TO BLOOD BANK

## 2023-12-14 LAB — PREPARE RBC (CROSSMATCH)

## 2023-12-14 MED ORDER — LUSPATERCEPT-AAMT 75 MG ~~LOC~~ SOLR
1.7300 mg/kg | Freq: Once | SUBCUTANEOUS | Status: AC
  Administered 2023-12-14: 175 mg via SUBCUTANEOUS
  Filled 2023-12-14: qty 0.5
  Filled 2023-12-14: qty 3.5

## 2023-12-14 NOTE — Patient Instructions (Signed)
You received your injection today.  Your hgb is 7.9 and because you aren't feeling well, we will give you 1 unit of blood tomorrow.

## 2023-12-14 NOTE — Progress Notes (Signed)
Patient is here today for his injection per MD orders.  He reports that he is not feeling well. He has zero energy and has felt poorly for the last several days. His hgb today is 7.9.  He will get his injection today and 1 unit of PRBC tomorrow per Dr. Ellin Saba.  Patient is aware.  We will proceed with treatment today. Patient remained stable during injection.  He tolerated without incidence.  He was discharged stable via wheelchair with his wife.  He will return tomorrow as scheduled for his unit of blood.

## 2023-12-14 NOTE — Transitions of Care (Post Inpatient/ED Visit) (Signed)
   12/14/2023  Name: David Duarte MRN: [id number] DOB: [birth year]  Today's TOC FU Call Status: Today's TOC FU Call Status:: Unsuccessful Call (3rd Attempt) Unsuccessful Call (1st Attempt) Date: 12/08/23 Unsuccessful Call (2nd Attempt) Date: 12/14/23 Unsuccessful Call (3rd Attempt) Date: 12/14/23  Attempted to reach the patient regarding the most recent Inpatient/ED visit.  Follow Up Plan: No further outreach attempts will be made at this time. We have been unable to contact the patient.  Signature Karena Addison, LPN [facility] Nurse Health Advisor Direct Dial [phone]

## 2023-12-14 NOTE — Transitions of Care (Post Inpatient/ED Visit) (Signed)
   12/14/2023  Name: David Duarte MRN: [id number] DOB: [birth year]  Today's TOC FU Call Status: Today's TOC FU Call Status:: Unsuccessful Call (2nd Attempt) Unsuccessful Call (1st Attempt) Date: 12/08/23 Unsuccessful Call (2nd Attempt) Date: 12/14/23  Attempted to reach the patient regarding the most recent Inpatient/ED visit.  Follow Up Plan: Additional outreach attempts will be made to reach the patient to complete the Transitions of Care (Post Inpatient/ED visit) call.   Signature Karena Addison, LPN [facility] Nurse Health Advisor Direct Dial [phone]

## 2023-12-15 ENCOUNTER — Inpatient Hospital Stay: Payer: Medicare Other

## 2023-12-15 ENCOUNTER — Ambulatory Visit (INDEPENDENT_AMBULATORY_CARE_PROVIDER_SITE_OTHER): Payer: Medicare Other | Admitting: Gastroenterology

## 2023-12-15 ENCOUNTER — Encounter: Payer: Self-pay | Admitting: Gastroenterology

## 2023-12-15 VITALS — BP 128/70 | HR 105 | Temp 98.6°F | Ht 72.0 in | Wt 192.6 lb

## 2023-12-15 DIAGNOSIS — K219 Gastro-esophageal reflux disease without esophagitis: Secondary | ICD-10-CM | POA: Diagnosis not present

## 2023-12-15 DIAGNOSIS — K5792 Diverticulitis of intestine, part unspecified, without perforation or abscess without bleeding: Secondary | ICD-10-CM

## 2023-12-15 DIAGNOSIS — C9310 Chronic myelomonocytic leukemia not having achieved remission: Secondary | ICD-10-CM | POA: Diagnosis not present

## 2023-12-15 DIAGNOSIS — K5900 Constipation, unspecified: Secondary | ICD-10-CM | POA: Diagnosis not present

## 2023-12-15 DIAGNOSIS — K7460 Unspecified cirrhosis of liver: Secondary | ICD-10-CM

## 2023-12-15 DIAGNOSIS — R531 Weakness: Secondary | ICD-10-CM

## 2023-12-15 DIAGNOSIS — D469 Myelodysplastic syndrome, unspecified: Secondary | ICD-10-CM

## 2023-12-15 DIAGNOSIS — R634 Abnormal weight loss: Secondary | ICD-10-CM

## 2023-12-15 MED ORDER — SODIUM CHLORIDE 0.9% IV SOLUTION
250.0000 mL | INTRAVENOUS | Status: DC
  Administered 2023-12-15: 100 mL via INTRAVENOUS

## 2023-12-15 MED ORDER — LUBIPROSTONE 24 MCG PO CAPS: 24.0000 ug | ORAL_CAPSULE | Freq: Two times a day (BID) | ORAL | 3 refills | Status: DC

## 2023-12-15 MED ORDER — SODIUM CHLORIDE 0.9% FLUSH: 10.0000 mL | INTRAVENOUS | Status: DC | PRN

## 2023-12-15 MED ORDER — OMEPRAZOLE 40 MG PO CPDR: 40.0000 mg | DELAYED_RELEASE_CAPSULE | Freq: Every day | ORAL | 3 refills | Status: DC

## 2023-12-15 NOTE — Patient Instructions (Signed)

## 2023-12-15 NOTE — Patient Instructions (Signed)
 I recommend Boost or Ensure twice a day between meals.   We will stop pantoprazole  and start omeprazole  once each morning, 30 minutes before breakfast.   For constipation: let's try Amitiza  24 micrograms twice a day with food to avoid nausea.   We can see you in 6-8 weeks!   I enjoyed seeing you again today! I value our relationship and want to provide genuine, compassionate, and quality care. You may receive a survey regarding your visit with me, and I welcome your feedback! Thanks so much for taking the time to complete this. I look forward to seeing you again.      Therisa MICAEL Stager, PhD, ANP-BC [facility]

## 2023-12-15 NOTE — Progress Notes (Signed)
Patient took own tylenol and benadryl from home.

## 2023-12-15 NOTE — Progress Notes (Signed)
UNMATCHED BLOOD PRODUCT NOTE  Compare the patient ID on the blood tag to the patient ID on the hospital armband and Blood Bank armband. Then confirm the unit number on the blood tag matches the unit number on the blood product.  If a discrepancy is discovered return the product to blood bank immediately.   Blood Product Type: Packed Red Blood Cells  Unit #: (Found on blood product bag, begins with W) [id number]  Product Code #: (Found on blood product bag, begins with E) [id number]   Start Time: 1145  Starting Rate: 120 ml/hr  Rate increase/decreased  (if applicable):      250 ml/hr  Rate changed time (if applicable): 1200   Stop Time: 1300   All Other Documentation should be documented within the Blood Admin Flowsheet per policy.   Patient tolerated transfusion with no complaints voiced.  Side effects with management reviewed with understanding verbalized.  Peripheral IV site clean and dry with no bruising or swelling noted at site.  Good blood return noted before and after administration.  Band aid applied.  Patient left in satisfactory condition with VSS and no s/s of distress noted.
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
## 2023-12-16 ENCOUNTER — Telehealth: Payer: Self-pay | Admitting: *Deleted

## 2023-12-16 ENCOUNTER — Other Ambulatory Visit: Payer: Self-pay

## 2023-12-16 LAB — BPAM RBC
Blood Product Expiration Date: 202503132359
Unit Type and Rh: 5100

## 2023-12-16 NOTE — Telephone Encounter (Addendum)
 Patient was in office yesterday with c/o back and knee pain. Per Dr. Katragadda, he recommends that it is unlikely from injections. He should contact to his PMD. Or we can make a referral to orthopedics. Wife states that he is already under the care of an orthopedic physician and does not wish to make an appointment with current PCP, as she is in the process of changing providers.

## 2023-12-18 LAB — TYPE AND SCREEN
ABO/RH(D): O POS
Antibody Screen: POSITIVE
Unit division: 0
Unit division: 0

## 2023-12-18 LAB — BPAM RBC
Blood Product Expiration Date: 202503132359
ISSUE DATE / TIME: 202502041132
Unit Type and Rh: 5100

## 2023-12-20 ENCOUNTER — Other Ambulatory Visit: Payer: Self-pay

## 2023-12-20 ENCOUNTER — Inpatient Hospital Stay (HOSPITAL_COMMUNITY)
Admission: EM | Admit: 2023-12-20 | Discharge: 2023-12-28 | DRG: 854 | Disposition: A | Discharge status: Hospice | Payer: Medicare Other | Attending: Family Medicine | Admitting: Family Medicine

## 2023-12-20 ENCOUNTER — Emergency Department (HOSPITAL_COMMUNITY): Payer: Medicare Other

## 2023-12-20 ENCOUNTER — Encounter (HOSPITAL_COMMUNITY): Payer: Self-pay

## 2023-12-20 DIAGNOSIS — D72829 Elevated white blood cell count, unspecified: Secondary | ICD-10-CM

## 2023-12-20 DIAGNOSIS — Z820 Family history of epilepsy and other diseases of the nervous system: Secondary | ICD-10-CM

## 2023-12-20 DIAGNOSIS — B9562 Methicillin resistant Staphylococcus aureus infection as the cause of diseases classified elsewhere: Principal | ICD-10-CM | POA: Diagnosis present

## 2023-12-20 DIAGNOSIS — Y835 Amputation of limb(s) as the cause of abnormal reaction of the patient, or of later complication, without mention of misadventure at the time of the procedure: Secondary | ICD-10-CM | POA: Diagnosis present

## 2023-12-20 DIAGNOSIS — Z66 Do not resuscitate: Secondary | ICD-10-CM | POA: Diagnosis present

## 2023-12-20 DIAGNOSIS — I361 Nonrheumatic tricuspid (valve) insufficiency: Secondary | ICD-10-CM | POA: Diagnosis not present

## 2023-12-20 DIAGNOSIS — Z833 Family history of diabetes mellitus: Secondary | ICD-10-CM

## 2023-12-20 DIAGNOSIS — R7881 Bacteremia: Secondary | ICD-10-CM | POA: Diagnosis not present

## 2023-12-20 DIAGNOSIS — I2510 Atherosclerotic heart disease of native coronary artery without angina pectoris: Secondary | ICD-10-CM | POA: Diagnosis present

## 2023-12-20 DIAGNOSIS — R531 Weakness: Secondary | ICD-10-CM | POA: Diagnosis present

## 2023-12-20 DIAGNOSIS — F419 Anxiety disorder, unspecified: Secondary | ICD-10-CM | POA: Diagnosis present

## 2023-12-20 DIAGNOSIS — Z515 Encounter for palliative care: Secondary | ICD-10-CM

## 2023-12-20 DIAGNOSIS — Z86711 Personal history of pulmonary embolism: Secondary | ICD-10-CM

## 2023-12-20 DIAGNOSIS — L02611 Cutaneous abscess of right foot: Secondary | ICD-10-CM | POA: Diagnosis present

## 2023-12-20 DIAGNOSIS — N189 Chronic kidney disease, unspecified: Secondary | ICD-10-CM | POA: Diagnosis present

## 2023-12-20 DIAGNOSIS — A4102 Sepsis due to Methicillin resistant Staphylococcus aureus: Principal | ICD-10-CM | POA: Diagnosis present

## 2023-12-20 DIAGNOSIS — B9689 Other specified bacterial agents as the cause of diseases classified elsewhere: Secondary | ICD-10-CM | POA: Diagnosis present

## 2023-12-20 DIAGNOSIS — D696 Thrombocytopenia, unspecified: Secondary | ICD-10-CM | POA: Diagnosis present

## 2023-12-20 DIAGNOSIS — E1152 Type 2 diabetes mellitus with diabetic peripheral angiopathy with gangrene: Secondary | ICD-10-CM | POA: Diagnosis present

## 2023-12-20 DIAGNOSIS — Z89411 Acquired absence of right great toe: Secondary | ICD-10-CM

## 2023-12-20 DIAGNOSIS — D469 Myelodysplastic syndrome, unspecified: Secondary | ICD-10-CM | POA: Diagnosis present

## 2023-12-20 DIAGNOSIS — M869 Osteomyelitis, unspecified: Secondary | ICD-10-CM | POA: Diagnosis present

## 2023-12-20 DIAGNOSIS — E1122 Type 2 diabetes mellitus with diabetic chronic kidney disease: Secondary | ICD-10-CM | POA: Diagnosis present

## 2023-12-20 DIAGNOSIS — C9310 Chronic myelomonocytic leukemia not having achieved remission: Secondary | ICD-10-CM | POA: Diagnosis present

## 2023-12-20 DIAGNOSIS — A419 Sepsis, unspecified organism: Secondary | ICD-10-CM | POA: Diagnosis not present

## 2023-12-20 DIAGNOSIS — Z79899 Other long term (current) drug therapy: Secondary | ICD-10-CM

## 2023-12-20 DIAGNOSIS — N179 Acute kidney failure, unspecified: Principal | ICD-10-CM | POA: Diagnosis present

## 2023-12-20 DIAGNOSIS — F05 Delirium due to known physiological condition: Secondary | ICD-10-CM | POA: Diagnosis present

## 2023-12-20 DIAGNOSIS — K219 Gastro-esophageal reflux disease without esophagitis: Secondary | ICD-10-CM | POA: Diagnosis present

## 2023-12-20 DIAGNOSIS — I714 Abdominal aortic aneurysm, without rupture, unspecified: Secondary | ICD-10-CM | POA: Diagnosis present

## 2023-12-20 DIAGNOSIS — Z87891 Personal history of nicotine dependence: Secondary | ICD-10-CM | POA: Diagnosis not present

## 2023-12-20 DIAGNOSIS — I152 Hypertension secondary to endocrine disorders: Secondary | ICD-10-CM | POA: Diagnosis present

## 2023-12-20 DIAGNOSIS — R8281 Pyuria: Secondary | ICD-10-CM | POA: Diagnosis present

## 2023-12-20 DIAGNOSIS — M51369 Other intervertebral disc degeneration, lumbar region without mention of lumbar back pain or lower extremity pain: Secondary | ICD-10-CM | POA: Diagnosis present

## 2023-12-20 DIAGNOSIS — D62 Acute posthemorrhagic anemia: Secondary | ICD-10-CM | POA: Diagnosis present

## 2023-12-20 DIAGNOSIS — G8929 Other chronic pain: Secondary | ICD-10-CM | POA: Diagnosis present

## 2023-12-20 DIAGNOSIS — Z792 Long term (current) use of antibiotics: Secondary | ICD-10-CM

## 2023-12-20 DIAGNOSIS — R1030 Lower abdominal pain, unspecified: Secondary | ICD-10-CM | POA: Diagnosis present

## 2023-12-20 DIAGNOSIS — E7800 Pure hypercholesterolemia, unspecified: Secondary | ICD-10-CM | POA: Diagnosis present

## 2023-12-20 DIAGNOSIS — Z7901 Long term (current) use of anticoagulants: Secondary | ICD-10-CM | POA: Diagnosis not present

## 2023-12-20 DIAGNOSIS — E876 Hypokalemia: Secondary | ICD-10-CM | POA: Diagnosis present

## 2023-12-20 DIAGNOSIS — E1169 Type 2 diabetes mellitus with other specified complication: Secondary | ICD-10-CM | POA: Diagnosis not present

## 2023-12-20 DIAGNOSIS — R6520 Severe sepsis without septic shock: Secondary | ICD-10-CM | POA: Diagnosis present

## 2023-12-20 DIAGNOSIS — M1711 Unilateral primary osteoarthritis, right knee: Secondary | ICD-10-CM | POA: Diagnosis present

## 2023-12-20 DIAGNOSIS — D500 Iron deficiency anemia secondary to blood loss (chronic): Secondary | ICD-10-CM | POA: Diagnosis present

## 2023-12-20 DIAGNOSIS — D631 Anemia in chronic kidney disease: Secondary | ICD-10-CM | POA: Diagnosis present

## 2023-12-20 DIAGNOSIS — M86171 Other acute osteomyelitis, right ankle and foot: Secondary | ICD-10-CM

## 2023-12-20 DIAGNOSIS — I38 Endocarditis, valve unspecified: Secondary | ICD-10-CM | POA: Diagnosis not present

## 2023-12-20 DIAGNOSIS — Z87442 Personal history of urinary calculi: Secondary | ICD-10-CM

## 2023-12-20 DIAGNOSIS — I480 Paroxysmal atrial fibrillation: Secondary | ICD-10-CM | POA: Diagnosis present

## 2023-12-20 DIAGNOSIS — F0390 Unspecified dementia without behavioral disturbance: Secondary | ICD-10-CM | POA: Diagnosis present

## 2023-12-20 DIAGNOSIS — T8743 Infection of amputation stump, right lower extremity: Secondary | ICD-10-CM | POA: Diagnosis present

## 2023-12-20 DIAGNOSIS — L0390 Cellulitis, unspecified: Secondary | ICD-10-CM | POA: Diagnosis present

## 2023-12-20 DIAGNOSIS — Z841 Family history of disorders of kidney and ureter: Secondary | ICD-10-CM

## 2023-12-20 DIAGNOSIS — Z89421 Acquired absence of other right toe(s): Secondary | ICD-10-CM

## 2023-12-20 DIAGNOSIS — I723 Aneurysm of iliac artery: Secondary | ICD-10-CM | POA: Diagnosis present

## 2023-12-20 DIAGNOSIS — R6521 Severe sepsis with septic shock: Secondary | ICD-10-CM | POA: Diagnosis not present

## 2023-12-20 DIAGNOSIS — I10 Essential (primary) hypertension: Secondary | ICD-10-CM | POA: Diagnosis not present

## 2023-12-20 DIAGNOSIS — E1159 Type 2 diabetes mellitus with other circulatory complications: Secondary | ICD-10-CM | POA: Diagnosis present

## 2023-12-20 DIAGNOSIS — R319 Hematuria, unspecified: Secondary | ICD-10-CM | POA: Diagnosis present

## 2023-12-20 DIAGNOSIS — Z8679 Personal history of other diseases of the circulatory system: Secondary | ICD-10-CM | POA: Diagnosis not present

## 2023-12-20 DIAGNOSIS — K7460 Unspecified cirrhosis of liver: Secondary | ICD-10-CM | POA: Diagnosis present

## 2023-12-20 DIAGNOSIS — Z751 Person awaiting admission to adequate facility elsewhere: Secondary | ICD-10-CM | POA: Diagnosis not present

## 2023-12-20 DIAGNOSIS — L089 Local infection of the skin and subcutaneous tissue, unspecified: Secondary | ICD-10-CM | POA: Diagnosis not present

## 2023-12-20 DIAGNOSIS — E871 Hypo-osmolality and hyponatremia: Secondary | ICD-10-CM | POA: Diagnosis present

## 2023-12-20 LAB — COMPREHENSIVE METABOLIC PANEL
ALT: 8 U/L (ref 0–44)
AST: 11 U/L — ABNORMAL LOW (ref 15–41)
Albumin: 3 g/dL — ABNORMAL LOW (ref 3.5–5.0)
Alkaline Phosphatase: 67 U/L (ref 38–126)
Anion gap: 17 — ABNORMAL HIGH (ref 5–15)
BUN: 42 mg/dL — ABNORMAL HIGH (ref 8–23)
CO2: 24 mmol/L (ref 22–32)
Calcium: 9.1 mg/dL (ref 8.9–10.3)
Chloride: 92 mmol/L — ABNORMAL LOW (ref 98–111)
Creatinine, Ser: 1.88 mg/dL — ABNORMAL HIGH (ref 0.61–1.24)
GFR, Estimated: 35 mL/min — ABNORMAL LOW (ref >60)
Glucose, Bld: 148 mg/dL — ABNORMAL HIGH (ref 70–99)
Potassium: 2.8 mmol/L — ABNORMAL LOW (ref 3.5–5.1)
Sodium: 133 mmol/L — ABNORMAL LOW (ref 135–145)
Total Bilirubin: 2.1 mg/dL — ABNORMAL HIGH (ref 0.0–1.2)
Total Protein: 8.3 g/dL — ABNORMAL HIGH (ref 6.5–8.1)

## 2023-12-20 LAB — URINALYSIS, ROUTINE W REFLEX MICROSCOPIC
Bacteria, UA: NONE SEEN
Bilirubin Urine: NEGATIVE
Glucose, UA: NEGATIVE mg/dL
Ketones, ur: NEGATIVE mg/dL
Leukocytes,Ua: NEGATIVE
Nitrite: NEGATIVE
Protein, ur: 100 mg/dL — AB
RBC / HPF: >50 RBC/hpf (ref 0–5)
Specific Gravity, Urine: 1.024 (ref 1.005–1.030)
WBC, UA: >50 WBC/hpf (ref 0–5)
pH: 5 (ref 5.0–8.0)

## 2023-12-20 LAB — CBC
HCT: 35.1 % — ABNORMAL LOW (ref 39.0–52.0)
Hemoglobin: 10.7 g/dL — ABNORMAL LOW (ref 13.0–17.0)
MCH: 29.2 pg (ref 26.0–34.0)
MCHC: 30.5 g/dL (ref 30.0–36.0)
MCV: 95.6 fL (ref 80.0–100.0)
Platelets: 209 10*3/uL (ref 150–400)
RBC: 3.67 MIL/uL — ABNORMAL LOW (ref 4.22–5.81)
RDW: 18.7 % — ABNORMAL HIGH (ref 11.5–15.5)
WBC: 32.2 10*3/uL — ABNORMAL HIGH (ref 4.0–10.5)
nRBC: 0.1 % (ref 0.0–0.2)

## 2023-12-20 LAB — LACTIC ACID, PLASMA
Lactic Acid, Venous: 1.5 mmol/L (ref 0.5–1.9)
Lactic Acid, Venous: 1.4 mmol/L (ref 0.5–1.9)

## 2023-12-20 LAB — LIPASE, BLOOD: Lipase: 25 U/L (ref 11–51)

## 2023-12-20 LAB — MAGNESIUM: Magnesium: 1.7 mg/dL (ref 1.7–2.4)

## 2023-12-20 MED ORDER — CARVEDILOL 3.125 MG PO TABS
3.1250 mg | ORAL_TABLET | Freq: Two times a day (BID) | ORAL | Status: DC
  Filled 2023-12-20: qty 1

## 2023-12-20 MED ORDER — LORAZEPAM 2 MG/ML IJ SOLN
0.5000 mg | Freq: Once | INTRAMUSCULAR | Status: AC
  Administered 2023-12-20: 0.5 mg via INTRAVENOUS
  Filled 2023-12-20: qty 1

## 2023-12-20 MED ORDER — POLYETHYLENE GLYCOL 3350 17 G PO PACK: 17.0000 g | PACK | Freq: Every day | ORAL | Status: DC | PRN

## 2023-12-20 MED ORDER — LACTATED RINGERS IV BOLUS
2000.0000 mL | Freq: Once | INTRAVENOUS | Status: AC
  Administered 2023-12-20: 2000 mL via INTRAVENOUS

## 2023-12-20 MED ORDER — FENTANYL CITRATE PF 50 MCG/ML IJ SOSY
50.0000 ug | PREFILLED_SYRINGE | Freq: Once | INTRAMUSCULAR | Status: AC
  Administered 2023-12-20: 50 ug via INTRAVENOUS
  Filled 2023-12-20: qty 1

## 2023-12-20 MED ORDER — SODIUM CHLORIDE 0.9 % IV BOLUS
1000.0000 mL | Freq: Once | INTRAVENOUS | Status: AC
  Administered 2023-12-20: 1000 mL via INTRAVENOUS

## 2023-12-20 MED ORDER — HYDROCODONE-ACETAMINOPHEN 5-325 MG PO TABS
1.0000 | ORAL_TABLET | Freq: Four times a day (QID) | ORAL | Status: DC | PRN
  Administered 2023-12-22 (×2): 1 via ORAL
  Filled 2023-12-20 (×2): qty 1

## 2023-12-20 MED ORDER — POTASSIUM CHLORIDE 10 MEQ/100ML IV SOLN
10.0000 meq | INTRAVENOUS | Status: AC
  Administered 2023-12-20 (×2): 10 meq via INTRAVENOUS
  Filled 2023-12-20 (×2): qty 100

## 2023-12-20 MED ORDER — POTASSIUM CHLORIDE CRYS ER 20 MEQ PO TBCR
40.0000 meq | EXTENDED_RELEASE_TABLET | Freq: Once | ORAL | Status: AC
  Administered 2023-12-20: 40 meq via ORAL
  Filled 2023-12-20: qty 2

## 2023-12-20 MED ORDER — DONEPEZIL HCL 5 MG PO TABS
10.0000 mg | ORAL_TABLET | Freq: Every morning | ORAL | Status: DC
Start: 2023-12-21 — End: 2023-12-27
  Administered 2023-12-22 – 2023-12-25 (×3): 10 mg via ORAL
  Filled 2023-12-20 (×6): qty 2

## 2023-12-20 MED ORDER — SODIUM CHLORIDE 0.9 % IV SOLN: INTRAVENOUS | Status: AC

## 2023-12-20 MED ORDER — PANTOPRAZOLE SODIUM 40 MG PO TBEC
40.0000 mg | DELAYED_RELEASE_TABLET | Freq: Every day | ORAL | Status: DC
  Administered 2023-12-21 – 2023-12-25 (×4): 40 mg via ORAL
  Filled 2023-12-20 (×7): qty 1

## 2023-12-20 MED ORDER — ACETAMINOPHEN 650 MG RE SUPP
650.0000 mg | Freq: Four times a day (QID) | RECTAL | Status: DC | PRN
Start: 2023-12-20 — End: 2023-12-27
  Administered 2023-12-21: 650 mg via RECTAL
  Filled 2023-12-20: qty 1

## 2023-12-20 MED ORDER — ACETAMINOPHEN 325 MG PO TABS: 650.0000 mg | ORAL_TABLET | Freq: Four times a day (QID) | ORAL | Status: DC | PRN

## 2023-12-20 MED ORDER — LUBIPROSTONE 24 MCG PO CAPS
24.0000 ug | ORAL_CAPSULE | Freq: Two times a day (BID) | ORAL | Status: DC
  Administered 2023-12-22 – 2023-12-25 (×4): 24 ug via ORAL
  Filled 2023-12-20 (×7): qty 1

## 2023-12-20 MED ORDER — ONDANSETRON HCL 4 MG/2ML IJ SOLN
4.0000 mg | Freq: Once | INTRAMUSCULAR | Status: AC
  Administered 2023-12-20: 4 mg via INTRAVENOUS
  Filled 2023-12-20: qty 2
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
## 2023-12-20 NOTE — ED Notes (Signed)
 Patient made aware again that we need a UA.

## 2023-12-20 NOTE — Assessment & Plan Note (Addendum)
 Potassium 2.8. -Check magnesium  -Replete

## 2023-12-20 NOTE — Assessment & Plan Note (Addendum)
 Controlled diabetes.  Not on medication.  A1c 12/06/2023- 4.8.

## 2023-12-20 NOTE — Assessment & Plan Note (Addendum)
 Significant leukocytosis of 32.0-previous WBC on file, peaked at 17.  History of CMML.  Follows with Dr. Rogers.  Presenting with AKI from poor oral intake, generalized weakness, cannot ambulate.  No focus of infection identified, UA large hemoglobin but not suggestive of UTI, rest x-ray clear, CT abdomen and pelvis without contrast negative for acute abnormality.  Afebrile, vitals stable. -Will consult oncology, if presentation is related to his new significant leukocytosis - TSH

## 2023-12-20 NOTE — Assessment & Plan Note (Signed)
 Hemoglobin stable at 10.7.

## 2023-12-20 NOTE — ED Provider Notes (Signed)
 [place] EMERGENCY DEPARTMENT AT [facility] Provider Note   CSN: [id number] Arrival date & time: 12/20/23  9052     History  Chief Complaint  Patient presents with   Abdominal Pain    David Duarte is a 83 y.o. 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  He endorses reduced oral intake,  stating last meal was 4-5 days ago,  also last BM was 4 days ago, hard and black in coloration.  No visible blood in stools,  not on iron supplementation.  Recent f/u with GI,  prescribed Amitiza  for his constipation which he hasn't started yet.   Wife and dg now at bedside,  adds that pt has had multiple falls and increasing difficulty standing and walking.  Has severe djd right knee which he endorses is reason for pain, but not surgical candidate.  Has pain right knee,  hip and low back.  Also bruising noted to right great toe.  Denies head injury.  Wife endorses she cannot help him in his current situation being unable to walk.   The history is provided by the patient.       Home Medications Prior to Admission medications   Medication Sig Start Date End Date Taking? Authorizing Provider  acetaminophen  (TYLENOL ) 500 MG tablet Take 1,000 mg by mouth every 6 (six) hours as needed for moderate pain.   Yes [provider]  atorvastatin  (LIPITOR) 20 MG tablet Take 1 tablet (20 mg total) by mouth every morning. 09/25/23  Yes RakesRock HERO, FNP  carvedilol  (COREG ) 3.125 MG tablet  Take 3.125 mg by mouth 2 (two) times daily with a meal. 09/10/23  Yes [provider]  Choline Fenofibrate  (FENOFIBRIC ACID ) 135 MG CPDR Take 1 capsule by mouth daily. 09/25/23  Yes Rakes, Rock HERO, FNP  donepezil  (ARICEPT ) 10 MG tablet Take 1 tablet (10 mg total) by mouth every morning. 09/25/23  Yes Severa Rock HERO, FNP  ELIQUIS  5 MG TABS tablet TAKE 1 TABLET TWICE A DAY 09/07/23  Yes Rakes, Rock HERO, FNP  escitalopram  (LEXAPRO ) 20 MG tablet Take 1 tablet (20 mg total) by mouth every morning. 09/25/23  Yes Rakes, Rock HERO, FNP  furosemide  (LASIX ) 20 MG tablet TAKE 1 TABLET DAILY 05/27/23  Yes Rakes, Rock HERO, FNP  hydrocortisone  cream 1 % Apply to affected area 2 times daily Patient taking differently: Apply 1 Application topically 2 (two) times daily as needed for itching. 03/05/23  Yes Hildegard, Amjad, PA-C  lubiprostone  (AMITIZA ) 24 MCG capsule Take 1 capsule (24 mcg total) by mouth 2 (two) times daily with a meal. 12/15/23  Yes Shirlean Therisa ORN, NP  omeprazole  (PRILOSEC) 40 MG capsule Take 1 capsule (40 mg total) by mouth daily. Take 30 minutes before breakfast daily 12/15/23  Yes Shirlean Therisa ORN, NP  ondansetron  (ZOFRAN -ODT) 8 MG disintegrating tablet TAKE 1 TABLET BY MOUTH EVERY 8 HOURS AS NEEDED FOR NAUSEA & VOMITING Patient taking differently: Take 8 mg by mouth every 8 (eight) hours as needed for vomiting or nausea. 10/12/23  Yes Rogers Hai, MD  glucose blood test strip EVERY DAY 04/22/18   [provider]  Insulin  Pen Needle 32G X 4 MM MISC USE AS DIRECTED 01/20/18   [provider]  TRUEplus Lancets 28G MISC Apply 1 each topically 3 (three) times daily. 10/04/22   [provider]  DANYA MICRO PEN NEEDLES 32G X 4 MM MISC  05/03/18   [provider]      Allergies    Patient has no known allergies.    Review of Systems   Review of Systems  Constitutional:  Positive for appetite change and fatigue. Negative for fever.  HENT:  Negative for congestion  and sore throat.   Eyes: Negative.   Respiratory:  Negative for chest tightness and shortness of breath.   Cardiovascular:  Negative for chest pain.  Gastrointestinal:  Positive for abdominal pain and constipation. Negative for nausea and vomiting.  Genitourinary: Negative.   Musculoskeletal:  Negative for arthralgias, joint swelling and neck pain.  Skin: Negative.  Negative for rash and wound.  Neurological:  Positive for weakness. Negative for dizziness, light-headedness, numbness and headaches.  Psychiatric/Behavioral: Negative.      Physical Exam Updated Vital Signs BP 128/71   Pulse 90   Temp 97.7 F (36.5 C) (Oral)   Resp 19   Ht 6' (1.829 m)   Wt 89.8 kg   SpO2 96%   BMI 26.85 kg/m  Physical Exam Vitals and nursing note reviewed.  Constitutional:      Appearance: He is well-developed.  HENT:     Head: Normocephalic and atraumatic.  Eyes:     Conjunctiva/sclera: Conjunctivae normal.  Cardiovascular:     Rate and Rhythm: Normal rate and regular rhythm.     Heart sounds: Normal heart sounds.  Pulmonary:     Effort: Pulmonary effort is normal.     Breath sounds: Normal breath sounds. No wheezing.  Abdominal:     General: Bowel sounds are normal.     Palpations: Abdomen is soft.     Tenderness: There is no abdominal tenderness.  Musculoskeletal:        General: Normal range of motion.     Cervical back: Normal range of motion.  Skin:    General: Skin is warm and dry.     Coloration: Skin is pale.  Neurological:     Mental Status: He is alert.     ED Results / Procedures / Treatments   Labs (all labs ordered are listed, but only abnormal results are displayed) Labs Reviewed  COMPREHENSIVE METABOLIC PANEL - Abnormal; Notable for the following components:      Result Value   Sodium 133 (*)    Potassium 2.8 (*)    Chloride 92 (*)    Glucose, Bld 148 (*)    BUN 42 (*)    Creatinine, Ser 1.88 (*)    Total Protein 8.3 (*)    Albumin 3.0 (*)    AST 11 (*)     Total Bilirubin 2.1 (*)    GFR, Estimated 35 (*)    Anion gap 17 (*)    All other components within normal limits  CBC - Abnormal; Notable for the following components:   WBC 32.2 (*)    RBC 3.67 (*)    Hemoglobin 10.7 (*)    HCT 35.1 (*)    RDW 18.7 (*)    All other components within normal limits  URINALYSIS, ROUTINE W REFLEX MICROSCOPIC - Abnormal; Notable for the following  components:   Color, Urine AMBER (*)    APPearance HAZY (*)    Hgb urine dipstick LARGE (*)    Protein, ur 100 (*)    All other components within normal limits  CULTURE, BLOOD (ROUTINE X 2)  CULTURE, BLOOD (ROUTINE X 2)  LIPASE, BLOOD  LACTIC ACID, PLASMA  LACTIC ACID, PLASMA  POC OCCULT BLOOD, ED    EKG EKG Interpretation Date/Time:  Sunday December 20 2023 09:55:07 EST Ventricular Rate:  88 PR Interval:    QRS Duration:  76 QT Interval:  428 QTC Calculation: 518 R Axis:   40  Text Interpretation: Atrial fibrillation Abnormal R-wave progression, early transition Borderline repolarization abnormality Prolonged QT interval No significant change since prior 1/25 Confirmed by Towana Sharper [phone]) on 12/20/2023 10:01:48 AM  Radiology DG Knee Complete 4 Views Right Result Date: 12/20/2023 CLINICAL DATA:  [facility] EXAM: RIGHT KNEE - COMPLETE 4+ VIEW COMPARISON:  None Available. FINDINGS: Frontal, bilateral oblique, lateral views of the right knee are obtained. No fracture, subluxation, or dislocation. Three compartmental osteoarthritis, most pronounced within the medial compartment with moderate joint space narrowing and osteophyte formation. No joint effusion. Diffuse atherosclerosis. Soft tissues are unremarkable. IMPRESSION: 1. Moderate 3 compartmental osteoarthritis. 2. No acute fracture. 3. Atherosclerosis. Electronically Signed   By: Sharper Daring M.D.   On: 12/20/2023 16:06   DG Foot Complete Right Result Date: 12/20/2023 CLINICAL DATA:  [facility] EXAM: RIGHT FOOT COMPLETE - 3+ VIEW COMPARISON:  None  Available. FINDINGS: Frontal, oblique, and lateral views of the right foot are obtained. Hammertoe deformities are noted. There are no acute displaced fractures. Multifocal osteoarthritis, greatest at the first metatarsophalangeal joint and throughout the midfoot. Soft tissues are unremarkable. IMPRESSION: 1. No acute displaced fracture. 2. Multifocal osteoarthritis. 3. Hammertoe deformities. Electronically Signed   By: Sharper Daring M.D.   On: 12/20/2023 16:06   CT ABDOMEN PELVIS WO CONTRAST Result Date: 12/20/2023 CLINICAL DATA:  Left lower quadrant abdominal pain. EXAM: CT ABDOMEN AND PELVIS WITHOUT CONTRAST TECHNIQUE: Multidetector CT imaging of the abdomen and pelvis was performed following the standard protocol without IV contrast. RADIATION DOSE REDUCTION: This exam was performed according to the departmental dose-optimization program which includes automated exposure control, adjustment of the mA and/or kV according to patient size and/or use of iterative reconstruction technique. COMPARISON:  CT angio abdomen and pelvis 10/22/2023 FINDINGS: Lower chest: The lung bases are clear without focal nodule, mass, or airspace disease. The heart size is normal. Extensive coronary artery calcifications are present. No significant pleural or pericardial effusion is present. Hepatobiliary: No focal liver abnormality is seen. No gallstones, gallbladder wall thickening, or biliary dilatation. The gallbladder is moderately distended without inflammatory change. This may be secondary to anorexia. Pancreas: Unremarkable. No pancreatic ductal dilatation or surrounding inflammatory changes. Spleen: The spleen is mildly enlarged, stable. Measures 13 cm cephalo caudad. Adrenals/Urinary Tract: Adrenal glands are normal bilaterally. A simple cyst along the posterior aspect of the right kidney is stable at 3.1 cm. No other focal lesions are present. No stone or obstruction is present. Ureters are within normal limits. The  urinary bladder is normal. Stomach/Bowel: The stomach and duodenum are within normal limits. Small bowel is unremarkable. The terminal ileum is normal. The appendix is visualized and normal. The ascending and transverse colon are within normal limits. Diverticular changes are present in the descending and sigmoid colon. No inflammatory changes are present in the sigmoid colon. 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 The native aneurysm sac is  stable at 7.0 cm. Atherosclerotic changes within the femoral arteries beyond the graft in the aorta proximal to the graft are stable. No significant adenopathy is present. Reproductive: The prostate is mildly enlarged, stable, measuring 5.0 cm. Other: No abdominal wall hernia or abnormality. No abdominopelvic ascites. Musculoskeletal: The vertebral body heights and alignment are normal. No focal osseous lesion is present. Bony pelvis is normal. Hips are located and within normal limits bilaterally. IMPRESSION: 1. No acute or focal lesion to explain the patient's symptoms. 2. Descending and sigmoid diverticulosis without diverticulitis. 3. 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 4. Stable mild splenomegaly. 5. Stable simple cyst of the right kidney. No follow-up imaging recommended. 6. Stable mild prostatomegaly. 7. Coronary artery disease. Electronically Signed   By: Lonni Necessary M.D.   On: 12/20/2023 11:35   DG Chest Portable 1 View Result Date: 12/20/2023 CLINICAL DATA:  weakness EXAM: PORTABLE CHEST - 1 VIEW COMPARISON:  None available. FINDINGS: Cardiomediastinal silhouette and pulmonary vasculature are within normal limits. Lungs are clear. IMPRESSION: No acute cardiopulmonary process. Electronically Signed   By: Aliene Lloyd M.D.   On: 12/20/2023 11:20    Procedures Procedures    Medications Ordered in ED Medications  potassium chloride  10 mEq in 100 mL IVPB (10 mEq Intravenous New Bag/Given  12/20/23 1701)  sodium chloride  0.9 % bolus 1,000 mL (0 mLs Intravenous Stopped 12/20/23 1148)  ondansetron  (ZOFRAN ) injection 4 mg (4 mg Intravenous Given 12/20/23 1047)  lactated ringers  bolus 2,000 mL (2,000 mLs Intravenous Bolus 12/20/23 1422)  fentaNYL  (SUBLIMAZE ) injection 50 mcg (50 mcg Intravenous Given 12/20/23 1421)  potassium chloride  SA (KLOR-CON  M) CR tablet 40 mEq (40 mEq Oral Given 12/20/23 1701)    ED Course/ Medical Decision Making/ A&P                                 Medical Decision Making Patient presenting with acute on chronic lower abdominal pain with radiation into his bilateral lower back, unclear etiology, stating he has had this pain for greater than 1 year but worsening over the past week, he also has increasing problems with mobility secondary to 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  He has had multiple falls, no head injury.  Past medical history also significant for AAA with surgical repair CML, diabetes.  He also endorses he has not urinated since yesterday evening.  He was unable to give us  a urine sample here today, after an In-N-Out cath, there were several large blood clots according to our nurse tech, but after in and out he was then able to give us  additional urine without difficulty, no urinary obstruction at this time, however he does have significant acute kidney injury with a creatinine of 1.88, he also has a BUN of 42 suggesting dehydration which per history makes sense.  He has a hypokalemia at 2.8 as well.  He has a significant leukocytosis with a WBC count of 32.2, hemoglobin is 10.7 and stable, platelet count is normal.  Prior recent WBC counts normal range.  Initial concern regarding significant infection versus worsening of his CML, blood cultures have been collected, he technically does not meet SIRS criteria however, and his lactate is 1.4.  Patient does require  admission secondary to acute kidney injury, and further evaluation of this  leukocytosis, pending blood cultures at this time.  He also has increased problems with mobility secondary to right severe knee degenerative disease, he will probably need PT evaluation and consideration for rehab placement since he is unable to stay safely in his home at this time.  Amount and/or Complexity of Data Reviewed Labs: ordered.    Details: Per above Radiology: ordered.    Details: Imaging reviewed,  no actue fracture or dislocation.  CT abd/pelvis - no acute intraabdominal findings. Discussion of management or test interpretation with external provider(s): Pt discussed with Dr. Pearlean who agrees with admission.   Risk Decision regarding hospitalization.           Final Clinical Impression(s) / ED Diagnoses Final diagnoses:  AKI (acute kidney injury) (HCC)  Leukocytosis, unspecified type  Hypokalemia    Rx / DC Orders ED Discharge Orders     None         Birdena Mliss RIGGERS 12/20/23 1751    Towana Ozell BROCKS, MD 12/20/23 [id number]

## 2023-12-20 NOTE — Assessment & Plan Note (Addendum)
 Creatinine elevated at 1.8, baseline about 1.  Likely prerenal from poor oral intake over the past few weeks. -3 L bolus given, continue N/s 75cc/hr x 20hrs - Hold Lasix  20mg  daily -PT eval

## 2023-12-20 NOTE — Assessment & Plan Note (Addendum)
 History of atrial fibrillation and PE.  On anticoagulation with Eliquis .  EKG today showing atrial fibrillation. -Eliquis  with hematuria -Resume carvedilol 
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
## 2023-12-20 NOTE — ED Triage Notes (Signed)
 Pt BIB ems for failure to thrive pt states d/t abdominal pain. Per EMS pt is a cancer pt Leukemia. Pt see's Dr. Linnell Richardson in the [facility]. Pt has back pain from thoracic down to sacral from midline wrapping around. Abdominal pain from naval wrapping around

## 2023-12-21 ENCOUNTER — Inpatient Hospital Stay (HOSPITAL_COMMUNITY): Payer: Medicare Other

## 2023-12-21 DIAGNOSIS — I480 Paroxysmal atrial fibrillation: Secondary | ICD-10-CM | POA: Diagnosis not present

## 2023-12-21 DIAGNOSIS — A419 Sepsis, unspecified organism: Secondary | ICD-10-CM | POA: Diagnosis not present

## 2023-12-21 DIAGNOSIS — E876 Hypokalemia: Secondary | ICD-10-CM | POA: Diagnosis not present

## 2023-12-21 DIAGNOSIS — R7881 Bacteremia: Secondary | ICD-10-CM

## 2023-12-21 DIAGNOSIS — I38 Endocarditis, valve unspecified: Secondary | ICD-10-CM

## 2023-12-21 DIAGNOSIS — N179 Acute kidney failure, unspecified: Secondary | ICD-10-CM | POA: Diagnosis not present

## 2023-12-21 DIAGNOSIS — L089 Local infection of the skin and subcutaneous tissue, unspecified: Secondary | ICD-10-CM

## 2023-12-21 LAB — BLOOD CULTURE ID PANEL (REFLEXED) - BCID2

## 2023-12-21 LAB — CBC
HCT: 28.6 % — ABNORMAL LOW (ref 39.0–52.0)
Hemoglobin: 8.4 g/dL — ABNORMAL LOW (ref 13.0–17.0)
MCH: 28.9 pg (ref 26.0–34.0)
MCHC: 29.4 g/dL — ABNORMAL LOW (ref 30.0–36.0)
MCV: 98.3 fL (ref 80.0–100.0)
Platelets: 161 10*3/uL (ref 150–400)
RBC: 2.91 MIL/uL — ABNORMAL LOW (ref 4.22–5.81)
RDW: 18.9 % — ABNORMAL HIGH (ref 11.5–15.5)
WBC: 24 10*3/uL — ABNORMAL HIGH (ref 4.0–10.5)
nRBC: 0.2 % (ref 0.0–0.2)

## 2023-12-21 LAB — TSH: TSH: 1.604 u[IU]/mL (ref 0.350–4.500)

## 2023-12-21 LAB — BASIC METABOLIC PANEL
Anion gap: 14 (ref 5–15)
BUN: 39 mg/dL — ABNORMAL HIGH (ref 8–23)
CO2: 20 mmol/L — ABNORMAL LOW (ref 22–32)
Calcium: 8.3 mg/dL — ABNORMAL LOW (ref 8.9–10.3)
Chloride: 100 mmol/L (ref 98–111)
Creatinine, Ser: 1.37 mg/dL — ABNORMAL HIGH (ref 0.61–1.24)
GFR, Estimated: 52 mL/min — ABNORMAL LOW (ref >60)
Glucose, Bld: 140 mg/dL — ABNORMAL HIGH (ref 70–99)
Potassium: 2.9 mmol/L — ABNORMAL LOW (ref 3.5–5.1)
Sodium: 134 mmol/L — ABNORMAL LOW (ref 135–145)

## 2023-12-21 MED ORDER — VANCOMYCIN HCL 1750 MG/350ML IV SOLN
1750.0000 mg | Freq: Once | INTRAVENOUS | Status: AC
  Administered 2023-12-21: 1750 mg via INTRAVENOUS
  Filled 2023-12-21: qty 350

## 2023-12-21 MED ORDER — VANCOMYCIN HCL 1500 MG/300ML IV SOLN
1500.0000 mg | INTRAVENOUS | Status: DC
  Administered 2023-12-22 – 2023-12-24 (×3): 1500 mg via INTRAVENOUS
  Filled 2023-12-21 (×3): qty 300

## 2023-12-21 MED ORDER — GADOBUTROL 1 MMOL/ML IV SOLN
9.0000 mL | Freq: Once | INTRAVENOUS | Status: AC | PRN
  Administered 2023-12-21: 9 mL via INTRAVENOUS

## 2023-12-21 MED ORDER — SODIUM CHLORIDE 0.9 % IV SOLN
2.0000 g | INTRAVENOUS | Status: DC
  Administered 2023-12-21 – 2023-12-23 (×4): 2 g via INTRAVENOUS
  Filled 2023-12-21 (×3): qty 20

## 2023-12-21 MED ORDER — VANCOMYCIN HCL 1750 MG/350ML IV SOLN
1750.0000 mg | INTRAVENOUS | Status: DC
Start: 2023-12-23 — End: 2023-12-21

## 2023-12-21 MED ORDER — POTASSIUM CHLORIDE 10 MEQ/100ML IV SOLN
10.0000 meq | INTRAVENOUS | Status: AC
  Administered 2023-12-21 (×3): 10 meq via INTRAVENOUS
  Filled 2023-12-21 (×3): qty 100

## 2023-12-21 MED ORDER — POTASSIUM CHLORIDE IN NACL 40-0.9 MEQ/L-% IV SOLN: INTRAVENOUS | Status: AC

## 2023-12-21 MED ORDER — MAGNESIUM SULFATE 2 GM/50ML IV SOLN
2.0000 g | Freq: Once | INTRAVENOUS | Status: AC
  Administered 2023-12-21: 2 g via INTRAVENOUS
  Filled 2023-12-21: qty 50

## 2023-12-21 NOTE — Evaluation (Signed)
 Physical Therapy Evaluation Patient Details Name: David Duarte MRN: [id number] DOB: [birth year] Today's Date: 12/21/2023  History of Present Illness  David Duarte is a 83 y.o. male with medical history significant for chronic myeloid leukemia, atrial fibrillation, pulmonary embolism, hypertension, diabetes mellitus, coronary artery disease, cirrhosis.  Patient presented to the ED via EMS with complaints of abdominal pain.  Reports chronic abdominal pain over the past week.  He reports pain is lower abdomen, he also reports some pain when he tried to urinate.  He reports reduced oral intake with generalized weakness, unable to ambulate, and spouse not been able to take care of patient in his current condition.  No vomiting no diarrhea.  No fevers no chills no difficulty breathing.  Reports his last meal was over a week ago.   Clinical Impression  Patient was agreeable to therapy. Patient required max A for bed mobility. Mod assist given for sit to stand and 3 side steps. RW was used for steps and sit to stand. Patient seemed confused during session. Had a hard time differentiating where they were. Due to confusion patient hx was unable to be obtained. Patient required max verbal/tactile cueing during session for tasks assessed during session. Patient was left in bed at conclusion of session. Patient will benefit from continued skilled physical therapy in hospital and recommended venue below to increase strength, balance, endurance for safe ADLs and gait.          If plan is discharge home, recommend the following: A lot of help with bathing/dressing/bathroom;A lot of help with walking and/or transfers;Help with stairs or ramp for entrance;Assistance with cooking/housework   Can travel by private vehicle        Equipment Recommendations None recommended by PT  Recommendations for Other Services       Functional Status Assessment Patient has had a recent decline in their functional  status and demonstrates the ability to make significant improvements in function in a reasonable and predictable amount of time.     Precautions / Restrictions Precautions Precautions: Fall Restrictions Weight Bearing Restrictions Per Provider Order: No      Mobility  Bed Mobility Overal bed mobility: Needs Assistance Bed Mobility: Supine to Sit, Sit to Supine     Supine to sit: Max assist Sit to supine: Max assist     Patient Response: Cooperative  Transfers Overall transfer level: Needs assistance Equipment used: Rolling walker (2 wheels) Transfers: Sit to/from Stand Sit to Stand: Mod assist                Ambulation/Gait Ambulation/Gait assistance: Mod assist Gait Distance (Feet): 3 Feet (side steps) Assistive device: Rolling walker (2 wheels) Gait Pattern/deviations: Step-to pattern, Decreased step length - right, Decreased step length - left Gait velocity: slow     General Gait Details: heavy reliance on RW, slow, labored movements  Stairs            Wheelchair Mobility     Tilt Bed Tilt Bed Patient Response: Cooperative  Modified Rankin (Stroke Patients Only)       Balance Overall balance assessment: Needs assistance Sitting-balance support: Bilateral upper extremity supported, Feet supported Sitting balance-Leahy Scale: Poor Sitting balance - Comments: poor/fair, required cueing to use UE for support to not lean posteriorly Postural control: Posterior lean Standing balance support: Bilateral upper extremity supported, Reliant on assistive device for balance Standing balance-Leahy Scale: Fair Standing balance comment: use RW  Pertinent Vitals/Pain Pain Assessment Pain Assessment: No/denies pain    Home Living Family/patient expects to be discharged to:: Private residence Living Arrangements: Spouse/significant other                 Additional Comments: Patient poor historian so could  not gather much information,    Prior Function Prior Level of Function : Patient poor historian/Family not available                     Extremity/Trunk Assessment   Upper Extremity Assessment Upper Extremity Assessment: Generalized weakness    Lower Extremity Assessment Lower Extremity Assessment: Generalized weakness    Cervical / Trunk Assessment Cervical / Trunk Assessment: Normal  Communication   Communication Communication: No apparent difficulties Cueing Techniques: Verbal cues;Tactile cues  Cognition Arousal: Alert Behavior During Therapy: WFL for tasks assessed/performed Overall Cognitive Status: No family/caregiver present to determine baseline cognitive functioning                                 General Comments: was confused during session.        General Comments      Exercises     Assessment/Plan    PT Assessment Patient needs continued PT services  PT Problem List Decreased strength;Decreased range of motion;Decreased activity tolerance;Decreased balance;Decreased mobility       PT Treatment Interventions DME instruction;Gait training;Patient/family education;Stair training;Functional mobility training;Therapeutic activities;Therapeutic exercise;Balance training    PT Goals (Current goals can be found in the Care Plan section)  Acute Rehab PT Goals Patient Stated Goal: to return home PT Goal Formulation: With patient Time For Goal Achievement: 01/04/24 Potential to Achieve Goals: Good    Frequency Min 3X/week     Co-evaluation               AM-PAC PT "6 Clicks" Mobility  Outcome Measure Help needed turning from your back to your side while in a flat bed without using bedrails?: A Lot Help needed moving from lying on your back to sitting on the side of a flat bed without using bedrails?: A Lot Help needed moving to and from a bed to a chair (including a wheelchair)?: A Lot Help needed standing up from a chair  using your arms (e.g., wheelchair or bedside chair)?: Total Help needed to walk in hospital room?: Total Help needed climbing 3-5 steps with a railing? : Total 6 Click Score: 9    End of Session   Activity Tolerance: Patient tolerated treatment well Patient left: in bed;with call bell/phone within reach;with bed alarm set Nurse Communication: Mobility status PT Visit Diagnosis: Unsteadiness on feet (R26.81);Other abnormalities of gait and mobility (R26.89);Muscle weakness (generalized) (M62.81)    Time: 3244-0102 PT Time Calculation (min) (ACUTE ONLY): 20 min   Charges:   PT Evaluation $PT Eval Moderate Complexity: 1 Mod PT Treatments $Therapeutic Activity: 8-22 mins PT General Charges $$ ACUTE PT VISIT: 1 Visit         Denni France SPT

## 2023-12-21 NOTE — Progress Notes (Signed)
 Progress note  Patient was admitted due to acute kidney injury and hypokalemia.  Shortly after admission, patient became febrile, he was tachycardic and tachypneic, thereby meeting SIRS criteria.  Blood culture showed gram-positive cocci in both aerobic and anaerobic bottles, patient was started on IV vancomycin   Total time:  10 minutes This includes time reviewing the chart including progress notes, labs, EKGs, taking medical decisions, ordering labs and documenting findings.

## 2023-12-21 NOTE — Progress Notes (Signed)
 Pharmacy Antibiotic Note  David Duarte is a 83 y.o. male admitted on 12/20/2023 with bacteremia.  Pharmacy has been consulted for Vancomycin   dosing.  Plan: Vancomycin  1750 mg IV q48h  Height: 6' (182.9 cm) Weight: 89.8 kg (198 lb) IBW/kg (Calculated) : 77.6  Temp (24hrs), Avg:99.5 F (37.5 C), Min:97.7 F (36.5 C), Max:102 F (38.9 C)  Recent Labs  Lab 12/14/23 0956 12/20/23 1010 12/20/23 1434 12/20/23 1617  WBC 14.8* 32.2*  --   --   CREATININE  --  1.88*  --   --   LATICACIDVEN  --   --  1.5 1.4    Estimated Creatinine Clearance: 33.8 mL/min (A) (by C-G formula based on SCr of 1.88 mg/dL (H)).    No Known Allergies   Carlota Chestnut 12/21/2023 4:11 AM

## 2023-12-21 NOTE — TOC Initial Note (Signed)
 Transition of Care [facility]) - Initial/Assessment Note    Patient Details  Name: David Duarte MRN: [id number] Date of Birth: 07-08-1941  [facility] CM/SW Contact:    Linnea Richards, LCSW Phone Number: 12/21/2023, 1:52 PM  Clinical Narrative:                  Pt admitted from home. PT recommending SNF at dc.  Pt lying in bed asleep with mitts on his hands. Called pt's wife to complete assessment. Pt's wife states that pt does not use any DME at home. He has had trouble walking for several weeks but really started to go down hill after last week's appointment/treatment at the cancer center. She states that she has to call church friends to help pick up pt when he falls at home as he is dead weight and she cannot lift him.  Discussed PT recommendation with pt's wife who states that she does not want him going to a SNF. Provided information on Home Health and DME options. She states she does not know right now what they will need. She did ask about hospice services which were briefly explained by this LCSW.   At this time, pt's wife would like to wait and see how pt responds to treatment during this stay and then further discuss dc planning needs.  TOC will follow.  Expected Discharge Plan: Home w Home Health Services Barriers to Discharge: Continued Medical Work up   Patient Goals and CMS Choice Patient states their goals for this hospitalization and ongoing recovery are:: return home   Choice offered to / list presented to : Spouse      Expected Discharge Plan and Services In-house Referral: Clinical Social Work     Living arrangements for the past 2 months: Single Family Home                                      Prior Living Arrangements/Services Living arrangements for the past 2 months: Single Family Home Lives with:: Spouse Patient language and need for interpreter reviewed:: Yes Do you feel safe going back to the place where you live?: Yes       Need for Family Participation in Patient Care: Yes (Comment) Care giver support system in place?: Yes (comment)   Criminal Activity/Legal Involvement Pertinent to Current Situation/Hospitalization: No - Comment as needed  Activities of Daily Living   ADL Screening (condition at time of admission) Independently performs ADLs?: Yes (appropriate for developmental age) Is the patient deaf or have difficulty hearing?: No  Permission Sought/Granted                  Emotional Assessment Appearance:: Appears stated age Attitude/Demeanor/Rapport: Unable to Assess Affect (typically observed): Unable to Assess Orientation: : Oriented to Self Alcohol  / Substance Use: Not Applicable Psych Involvement: No (comment)  Admission diagnosis:  Hypokalemia [E87.6] AKI (acute kidney injury) (HCC) [N17.9] Leukocytosis, unspecified type [D72.829] Patient Active Problem List   Diagnosis Date Noted   Sepsis due to undetermined organism (HCC) 12/21/2023   Gram-positive bacteremia 12/21/2023   AKI (acute kidney injury) (HCC) 12/20/2023   Hypokalemia 12/20/2023   Acute diverticulitis 10/23/2023   Sigmoid diverticulitis 10/22/2023   MDS (myelodysplastic syndrome) (HCC) 05/18/2023   Coronary artery disease involving native coronary artery of native heart without angina pectoris 01/28/2023   Anemia in CKD (chronic kidney disease) 01/22/2023   Abnormal  myocardial perfusion study 01/19/2023   Unilateral primary osteoarthritis, right knee 01/08/2023   Hypertension associated with diabetes (HCC) 01/06/2023   Iron deficiency anemia due to chronic blood loss 11/21/2022   Spinal stenosis of lumbar region 02/27/2022   Paroxysmal atrial fibrillation (HCC) 11/20/2021   On continuous oral anticoagulation 11/20/2021   Infrarenal abdominal aortic aneurysm (AAA) without rupture (HCC) 09/19/2021   Hepatosplenomegaly 11/15/2020   Hepatic cirrhosis (HCC) 07/19/2020   Hyperlipidemia associated with type 2  diabetes mellitus (HCC) 07/30/2019   Type 2 diabetes mellitus without complication, with long-term current use of insulin  (HCC) 07/30/2019   Recurrent major depressive disorder, in full remission (HCC) 09/25/2016   TOBACCO ABUSE 02/13/2010   GERD 02/13/2010   Constipation 02/13/2010   History of colonic polyps 02/13/2010   PCP:  Galvin Jules, FNP Pharmacy:   [facility] - [facility], [place] - [address] [place] [address] Phone: [phone] Fax: [phone]  [facility] - [place], [place] - [facility] AT Portal to Registered [facility] [address] [place] [address] Phone: [phone] Fax: [phone]     Social Drivers of Health (SDOH) Social History: SDOH Screenings   Food Insecurity: No Food Insecurity (12/20/2023)  Housing: Low Risk  (12/20/2023)  Transportation Needs: No Transportation Needs (12/20/2023)  Utilities: Not At Risk (12/20/2023)  Depression (PHQ2-9): Low Risk  (10/28/2023)  Financial Resource Strain: Low Risk  (12/06/2023)   Received from [facility]  Physical Activity: Inactive (12/06/2023)   Received from [facility]  Social Connections: Patient Unable To Answer (12/21/2023)  Stress: No Stress Concern Present (12/06/2023)   Received from [facility]  Tobacco Use: Medium Risk (12/20/2023)  Health Literacy: Low Risk  (12/06/2023)   Received from [facility]   SDOH Interventions:     Readmission Risk Interventions     No data to display

## 2023-12-21 NOTE — Hospital Course (Signed)
 83 y.o. male with medical history significant for chronic myeloid leukemia, atrial fibrillation, pulmonary embolism, hypertension, diabetes mellitus, coronary artery disease, cirrhosis. Patient presented to the ED via EMS with complaints of abdominal pain.  Reports chronic abdominal pain over the past week.  He reports pain is lower abdomen, he also reports some pain when he tried to urinate.  He reports reduced oral intake with generalized weakness, unable to ambulate, and spouse not been able to take care of patient in his current condition.  No vomiting no diarrhea.  No fevers no chills no difficulty breathing.  Reports his last meal was over a week ago.   ED Course: Tmax 97.7.  Heart rate 87-95, respiratory rate 15-27, blood pressure systolic 97-143.  O2 sats mostly greater than 91% on room air. In the ED patient attempted voiding, was able to make urine, but it was blood-tinged, with small blood clots. Chest x-ray clear. CT abdomen/pelvis with contrast negative for acute abnormality. 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  Chest x-ray unremarkable. Creatinine elevated 1.88.  Potassium low 2.8.  Leukocytosis of 32.2. Lactic acid 1.5. 3 L bolus given. Hospitalist to admit for AKI and SIRS

## 2023-12-21 NOTE — Plan of Care (Signed)
  Problem: Acute Rehab PT Goals(only PT should resolve) Goal: Pt Will Go Supine/Side To Sit Outcome: Progressing Flowsheets (Taken 12/21/2023 1340) Pt will go Supine/Side to Sit: with moderate assist Goal: Patient Will Transfer Sit To/From Stand Outcome: Progressing Flowsheets (Taken 12/21/2023 1340) Patient will transfer sit to/from stand: with minimal assist Goal: Pt Will Transfer Bed To Chair/Chair To Bed Outcome: Progressing Flowsheets (Taken 12/21/2023 1340) Pt will Transfer Bed to Chair/Chair to Bed:  with min assist  with mod assist Goal: Pt Will Ambulate Outcome: Progressing Flowsheets (Taken 12/21/2023 1340) Pt will Ambulate:  10 feet  with minimal assist  with rolling walker     Harinder Romas SPT

## 2023-12-21 NOTE — Progress Notes (Addendum)
 PROGRESS NOTE  David Duarte ZOX:[id number] DOB: 1940-11-26 DOA: 12/20/2023 PCP: Galvin Jules, FNP  Brief History:   83 y.o. male with medical history significant for chronic myeloid leukemia, atrial fibrillation, pulmonary embolism, hypertension, diabetes mellitus, coronary artery disease, cirrhosis. Patient presented to the ED via EMS with complaints of abdominal pain.  Reports chronic abdominal pain over the past week.  He reports pain is lower abdomen, he also reports some pain when he tried to urinate.  He reports reduced oral intake with generalized weakness, unable to ambulate, and spouse not been able to take care of patient in his current condition.  No vomiting no diarrhea.  No fevers no chills no difficulty breathing.  Reports his last meal was over a week ago.   ED Course: Tmax 97.7.  Heart rate 87-95, respiratory rate 15-27, blood pressure systolic 97-143.  O2 sats mostly greater than 91% on room air. In the ED patient attempted voiding, was able to make urine, but it was blood-tinged, with small blood clots. Chest x-ray clear. CT abdomen/pelvis with contrast negative for acute abnormality. 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  Chest x-ray unremarkable. Creatinine elevated 1.88.  Potassium low 2.8.  Leukocytosis of 32.2. Lactic acid 1.5. 3 L bolus given. Hospitalist to admit for AKI and SIRS   Assessment/Plan: Sepsis -Present on admission -Presented with fever and leukocytosis and tachycardia -Secondary to urinary source and bacteremia -Start vancomycin  and ceftriaxone  empirically -Lactic acid 1.5>> 1.4 -Continue IV fluids -Follow blood and urine cultures -Personally reviewed chest x-ray--negative for infiltrates or edema  Pyuria/hematuria -UA>50 WBC -Continue ceftriaxone  pending culture data -Holding apixaban  temporarily  AKI -baseline creatinine 0.8-1.1 -presented with serum creatinine 1.88 -continue IVF  Bacteremia -GPC in 2 out of 2  sets -Empiric vancomycin  pending culture data  Paroxysmal atrial fibrillation -Currently in sinus rhythm -Holding carvedilol  secondary to soft blood pressure -Starting apixaban  temporarily secondary to hematuria  Myelodysplastic syndrome -Follow-up Dr. Cheree Cords -Patient has history of CMML  Abdominal pain -12/20/2023 CT abdomen and pelvis negative for acute findings. -Abdominal pain is improving  Hypokalemia -Repleting  Iron deficiency anemia due to chronic blood loss Hemoglobin stable  -baseline Hgb 8-9   Hypertension  -holding coreg  due to soft BPs  Hypokalemia -replete     Family Communication:  no Family at bedside  Consultants:  none  Code Status:  FULL  DVT Prophylaxis:  apixaban  on hold   Procedures: As Listed in Progress Note Above  Antibiotics: Vanc 2/9>> Ceftriaxone  2/9>>    Subjective: Patient is a little somnolent but awakens and follows commands.  He denies any chest pain, shortness breath, abdominal pain, nausea, vomiting.  Objective: Vitals:   12/21/23 0223 12/21/23 0238 12/21/23 0500 12/21/23 0800  BP:    (!) 101/48  Pulse: (!) 104     Resp:  (!) 24  (!) 24  Temp: (!) 102 F (38.9 C) (!) 102 F (38.9 C) 100.2 F (37.9 C)   TempSrc: Rectal Rectal Axillary   SpO2: 96%     Weight:      Height:        Intake/Output Summary (Last 24 hours) at 12/21/2023 8119 Last data filed at 12/21/2023 0500 Gross per 24 hour  Intake 1440.5 ml  Output 700 ml  Net 740.5 ml   Weight change:  Exam:  General:  Pt is alert, follows commands appropriately, not in acute distress HEENT: No icterus, No thrush, No neck mass, [place]/AT  Cardiovascular: RRR, S1/S2, no rubs, no gallops Respiratory: Bibasilar crackles but no wheezing.  Good air movement Abdomen: Soft/+BS, non tender, non distended, no guarding Extremities: No edema, No lymphangitis, No petechiae, No rashes, no synovitis   Data Reviewed: I have personally reviewed following labs and  imaging studies Basic Metabolic Panel: Recent Labs  Lab 12/20/23 1010 12/20/23 1918 12/21/23 0324  NA 133*  --  134*  K 2.8*  --  2.9*  CL 92*  --  100  CO2 24  --  20*  GLUCOSE 148*  --  140*  BUN 42*  --  39*  CREATININE 1.88*  --  1.37*  CALCIUM  9.1  --  8.3*  MG  --  1.7  --    Liver Function Tests: Recent Labs  Lab 12/20/23 1010  AST 11*  ALT 8  ALKPHOS 67  BILITOT 2.1*  PROT 8.3*  ALBUMIN 3.0*   Recent Labs  Lab 12/20/23 1010  LIPASE 25   No results for input(s): "AMMONIA" in the last 168 hours. Coagulation Profile: No results for input(s): "INR", "PROTIME" in the last 168 hours. CBC: Recent Labs  Lab 12/14/23 0956 12/20/23 1010 12/21/23 0324  WBC 14.8* 32.2* 24.0*  NEUTROABS 10.1*  --   --   HGB 7.9* 10.7* 8.4*  HCT 26.2* 35.1* 28.6*  MCV 101.6* 95.6 98.3  PLT 189 209 161   Cardiac Enzymes: No results for input(s): "CKTOTAL", "CKMB", "CKMBINDEX", "TROPONINI" in the last 168 hours. BNP: Invalid input(s): "POCBNP" CBG: No results for input(s): "GLUCAP" in the last 168 hours. HbA1C: No results for input(s): "HGBA1C" in the last 72 hours. Urine analysis:    Component Value Date/Time   COLORURINE AMBER (A) 12/20/2023 1600   APPEARANCEUR HAZY (A) 12/20/2023 1600   LABSPEC 1.024 12/20/2023 1600   PHURINE 5.0 12/20/2023 1600   GLUCOSEU NEGATIVE 12/20/2023 1600   HGBUR LARGE (A) 12/20/2023 1600   BILIRUBINUR NEGATIVE 12/20/2023 1600   KETONESUR NEGATIVE 12/20/2023 1600   PROTEINUR 100 (A) 12/20/2023 1600   NITRITE NEGATIVE 12/20/2023 1600   LEUKOCYTESUR NEGATIVE 12/20/2023 1600   Sepsis Labs: @LABRCNTIP (procalcitonin:4,lacticidven:4) ) Recent Results (from the past 240 hours)  Blood culture (routine x 2)     Status: None (Preliminary result)   Collection Time: 12/20/23  2:34 PM   Specimen: BLOOD LEFT ARM  Result Value Ref Range Status   Specimen Description   Final    BLOOD LEFT ARM BOTTLES DRAWN AEROBIC AND ANAEROBIC Performed at [facility], [address] [place], [place] [address]    Special Requests   Final    Blood Culture adequate volume Performed at [facility], [address], [place], [place] [address]    Culture  Setup Time   Final    GRAM POSITIVE COCCI IN BOTH AEROBIC AND ANAEROBIC BOTTLES CRITICAL RESULT CALLED TO, READ BACK BY AND VERIFIED WITH: ELLER,J ON 12/21/23 @ 0419 BY PURDIE,J CRITICAL RESULT CALLED TO, READ BACK BY AND VERIFIED WITH: PHARMD S.HURTH AT 12/21/2023 BY T.SAAD. Performed at [facility], [address] [place], [place] [address]    Culture GRAM POSITIVE COCCI  Final   Report Status PENDING  Incomplete  Blood Culture ID Panel (Reflexed)     Status: Abnormal   Collection Time: 12/20/23  2:34 PM  Result Value Ref Range Status   Enterococcus faecalis NOT DETECTED NOT DETECTED Final   Enterococcus Faecium NOT DETECTED NOT DETECTED Final   Listeria monocytogenes NOT DETECTED NOT DETECTED Final  Staphylococcus species DETECTED (A) NOT DETECTED Final    Comment: CRITICAL RESULT CALLED TO, READ BACK BY AND VERIFIED WITH: PHARMD S.HURTH AT 12/21/2023 BY T.SAAD.    Staphylococcus aureus (BCID) DETECTED (A) NOT DETECTED Final    Comment: Methicillin (oxacillin)-resistant Staphylococcus aureus (MRSA). MRSA is predictably resistant to beta-lactam antibiotics (except ceftaroline). Preferred therapy is vancomycin  unless clinically contraindicated. Patient requires contact precautions if  hospitalized. CRITICAL RESULT CALLED TO, READ BACK BY AND VERIFIED WITH: PHARMD S.HURTH AT 12/21/2023 BY T.SAAD.    Staphylococcus epidermidis NOT DETECTED NOT DETECTED Final   Staphylococcus lugdunensis NOT DETECTED NOT DETECTED Final   Streptococcus species NOT DETECTED NOT DETECTED Final   Streptococcus agalactiae NOT DETECTED NOT DETECTED Final   Streptococcus pneumoniae NOT DETECTED NOT DETECTED Final   Streptococcus pyogenes NOT DETECTED NOT DETECTED Final   A.calcoaceticus-baumannii NOT  DETECTED NOT DETECTED Final   Bacteroides fragilis NOT DETECTED NOT DETECTED Final   Enterobacterales NOT DETECTED NOT DETECTED Final   Enterobacter cloacae complex NOT DETECTED NOT DETECTED Final   Escherichia coli NOT DETECTED NOT DETECTED Final   Klebsiella aerogenes NOT DETECTED NOT DETECTED Final   Klebsiella oxytoca NOT DETECTED NOT DETECTED Final   Klebsiella pneumoniae NOT DETECTED NOT DETECTED Final   Proteus species NOT DETECTED NOT DETECTED Final   Salmonella species NOT DETECTED NOT DETECTED Final   Serratia marcescens NOT DETECTED NOT DETECTED Final   Haemophilus influenzae NOT DETECTED NOT DETECTED Final   Neisseria meningitidis NOT DETECTED NOT DETECTED Final   Pseudomonas aeruginosa NOT DETECTED NOT DETECTED Final   Stenotrophomonas maltophilia NOT DETECTED NOT DETECTED Final   Candida albicans NOT DETECTED NOT DETECTED Final   Candida auris NOT DETECTED NOT DETECTED Final   Candida glabrata NOT DETECTED NOT DETECTED Final   Candida krusei NOT DETECTED NOT DETECTED Final   Candida parapsilosis NOT DETECTED NOT DETECTED Final   Candida tropicalis NOT DETECTED NOT DETECTED Final   Cryptococcus neoformans/gattii NOT DETECTED NOT DETECTED Final   Meth resistant mecA/C and MREJ DETECTED (A) NOT DETECTED Final    Comment: CRITICAL RESULT CALLED TO, READ BACK BY AND VERIFIED WITH: PHARMD S.HURTH AT 12/21/2023 BY T.SAAD. Performed at [facility], [address] [place], [place] [address]   Blood culture (routine x 2)     Status: None (Preliminary result)   Collection Time: 12/20/23  2:36 PM   Specimen: Left Antecubital; Blood  Result Value Ref Range Status   Specimen Description   Final    LEFT ANTECUBITAL BOTTLES DRAWN AEROBIC AND ANAEROBIC Performed at [facility], [address] [place], [place] [address]    Special Requests   Final    Blood Culture results may not be optimal due to an inadequate volume of blood received in culture bottles Performed at [facility], [address] [place], [place] [address]    Culture  Setup Time   Final    GRAM POSITIVE COCCI IN BOTH AEROBIC AND ANAEROBIC BOTTLES CRITICAL RESULT CALLED TO, READ BACK BY AND VERIFIED WITH: ELLER,J ON 12/21/23 AT 0346 BY PURDIE,J CRITICAL VALUE NOTED.  VALUE IS CONSISTENT WITH PREVIOUSLY REPORTED AND CALLED VALUE. Performed at [facility], [address] [place], [place] [address]    Culture GRAM POSITIVE COCCI  Final   Report Status PENDING  Incomplete     Scheduled Meds:  donepezil   10 mg Oral q morning   lubiprostone   24 mcg Oral BID WC   pantoprazole   40 mg Oral Daily  Continuous Infusions:  sodium chloride  75 mL/hr at 12/20/23 2357   magnesium  sulfate bolus IVPB 2 g (12/21/23 0750)   potassium chloride      [START ON 12/22/2023] vancomycin       Procedures/Studies: DG Knee Complete 4 Views Right Result Date: 12/20/2023 CLINICAL DATA:  Marvell Slider EXAM: RIGHT KNEE - COMPLETE 4+ VIEW COMPARISON:  None Available. FINDINGS: Frontal, bilateral oblique, lateral views of the right knee are obtained. No fracture, subluxation, or dislocation. Three compartmental osteoarthritis, most pronounced within the medial compartment with moderate joint space narrowing and osteophyte formation. No joint effusion. Diffuse atherosclerosis. Soft tissues are unremarkable. IMPRESSION: 1. Moderate 3 compartmental osteoarthritis. 2. No acute fracture. 3. Atherosclerosis. Electronically Signed   By: Bobbye Burrow M.D.   On: 12/20/2023 16:06   DG Foot Complete Right Result Date: 12/20/2023 CLINICAL DATA:  Marvell Slider EXAM: RIGHT FOOT COMPLETE - 3+ VIEW COMPARISON:  None Available. FINDINGS: Frontal, oblique, and lateral views of the right foot are obtained. Hammertoe deformities are noted. There are no acute displaced fractures. Multifocal osteoarthritis, greatest at the first metatarsophalangeal joint and throughout the midfoot. Soft tissues are unremarkable. IMPRESSION: 1. No acute displaced fracture. 2.  Multifocal osteoarthritis. 3. Hammertoe deformities. Electronically Signed   By: Bobbye Burrow M.D.   On: 12/20/2023 16:06   CT ABDOMEN PELVIS WO CONTRAST Result Date: 12/20/2023 CLINICAL DATA:  Left lower quadrant abdominal pain. EXAM: CT ABDOMEN AND PELVIS WITHOUT CONTRAST TECHNIQUE: Multidetector CT imaging of the abdomen and pelvis was performed following the standard protocol without IV contrast. RADIATION DOSE REDUCTION: This exam was performed according to the departmental dose-optimization program which includes automated exposure control, adjustment of the mA and/or kV according to patient size and/or use of iterative reconstruction technique. COMPARISON:  CT angio abdomen and pelvis 10/22/2023 FINDINGS: Lower chest: The lung bases are clear without focal nodule, mass, or airspace disease. The heart size is normal. Extensive coronary artery calcifications are present. No significant pleural or pericardial effusion is present. Hepatobiliary: No focal liver abnormality is seen. No gallstones, gallbladder wall thickening, or biliary dilatation. The gallbladder is moderately distended without inflammatory change. This may be secondary to anorexia. Pancreas: Unremarkable. No pancreatic ductal dilatation or surrounding inflammatory changes. Spleen: The spleen is mildly enlarged, stable. Measures 13 cm cephalo caudad. Adrenals/Urinary Tract: Adrenal glands are normal bilaterally. A simple cyst along the posterior aspect of the right kidney is stable at 3.1 cm. No other focal lesions are present. No stone or obstruction is present. Ureters are within normal limits. The urinary bladder is normal. Stomach/Bowel: The stomach and duodenum are within normal limits. Small bowel is unremarkable. The terminal ileum is normal. The appendix is visualized and normal. The ascending and transverse colon are within normal limits. Diverticular changes are present in the descending and sigmoid colon. No inflammatory changes are  present in the sigmoid colon. 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 Atherosclerotic changes within the femoral arteries beyond the graft in the aorta proximal to the graft are stable. No significant adenopathy is present. Reproductive: The prostate is mildly enlarged, stable, measuring 5.0 cm. Other: No abdominal wall hernia or abnormality. No abdominopelvic ascites. Musculoskeletal: The vertebral body heights and alignment are normal. No focal osseous lesion is present. Bony pelvis is normal. Hips are located and within normal limits bilaterally. IMPRESSION: 1. No acute or focal lesion to explain the patient's symptoms. 2. Descending and sigmoid diverticulosis without diverticulitis. 3. 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 4. Stable mild splenomegaly. 5. Stable simple cyst of the right kidney. No follow-up imaging recommended. 6. Stable mild prostatomegaly. 7. Coronary artery disease. Electronically Signed   By: Audree Leas M.D.   On: 12/20/2023 11:35   DG Chest Portable 1 View Result Date: 12/20/2023 CLINICAL DATA:  weakness EXAM: PORTABLE CHEST - 1 VIEW COMPARISON:  None available. FINDINGS: Cardiomediastinal silhouette and pulmonary vasculature are within normal limits. Lungs are clear. IMPRESSION: No acute cardiopulmonary process. Electronically Signed   By: Elester Grim M.D.   On: 12/20/2023 11:20   DG Chest 2 View Result Date: 11/23/2023 CLINICAL DATA:  Chest pain EXAM: CHEST - 2 VIEW COMPARISON:  X-ray 01/22/2017 FINDINGS: The heart size and mediastinal contours are within normal limits. No consolidation, pneumothorax or effusion. No edema. Films are underinflated. Degenerative changes of the spine. Aortic endograft seen at the edge of the imaging field. The visualized skeletal structures are unremarkable. IMPRESSION: No acute cardiopulmonary disease.  Underinflation. Electronically  Signed   By: Adrianna Horde M.D.   On: 11/23/2023 14:26    Demaris Fillers, DO  [facility]  If 7PM-7AM, please contact night-coverage [email or web address] Password [email or web address] 12/21/2023, 8:42 AM   LOS: 1 day

## 2023-12-21 NOTE — Progress Notes (Signed)
*  PRELIMINARY RESULTS* Echocardiogram 2D Echocardiogram has been performed.  Bernis Brisker 12/21/2023, 5:55 PM

## 2023-12-21 NOTE — Plan of Care (Signed)
  Problem: Elimination: Goal: Will not experience complications related to urinary retention Outcome: Progressing   Problem: Pain Managment: Goal: General experience of comfort will improve and/or be controlled Outcome: Progressing   Problem: Safety: Goal: Ability to remain free from injury will improve Outcome: Progressing   Problem: Skin Integrity: Goal: Risk for impaired skin integrity will decrease Outcome: Progressing   Problem: Coping: Goal: Level of anxiety will decrease Outcome: Not Progressing

## 2023-12-21 NOTE — Plan of Care (Signed)
  Problem: Education: Goal: Knowledge of General Education information will improve Description Including pain rating scale, medication(s)/side effects and non-pharmacologic comfort measures Outcome: Progressing   Problem: Health Behavior/Discharge Planning: Goal: Ability to manage health-related needs will improve Outcome: Progressing   Problem: Clinical Measurements: Goal: Ability to maintain clinical measurements within normal limits will improve Outcome: Progressing Goal: Diagnostic test results will improve Outcome: Progressing Goal: Respiratory complications will improve Outcome: Progressing Goal: Cardiovascular complication will be avoided Outcome: Progressing   Problem: Activity: Goal: Risk for activity intolerance will decrease Outcome: Progressing   Problem: Nutrition: Goal: Adequate nutrition will be maintained Outcome: Progressing   Problem: Coping: Goal: Level of anxiety will decrease Outcome: Progressing

## 2023-12-21 NOTE — Progress Notes (Signed)
 PHARMACY - PHYSICIAN COMMUNICATION CRITICAL VALUE ALERT - BLOOD CULTURE IDENTIFICATION (BCID)  David Duarte is an 83 y.o. male who presented to [facility] on 12/20/2023   Assessment:  BCID shows MRSA 4/4 bottles  Name of physician (or Provider) Contacted: Dr. Winferd Hatter  Current antibiotics: Vancomycin    Changes to prescribed antibiotics recommended:  Patient is on recommended antibiotics - No changes needed  No results found for this or any previous visit.  Cliffton Dama, PharmD Clinical Pharmacist 12/21/2023 8:31 AM

## 2023-12-21 NOTE — Consult Note (Signed)
Virtual Visit via Telephone/Video Note   I connected with David Duarte   On 12/22/2023 at 5:55 AM  by Video and verified that I am speaking with the correct person using two identifiers.   I discussed the limitations, risks, security and privacy concerns of performing an evaluation and management service by video.   Location:   Patient: [facility] Provider: [facility]    Date of Admission:  12/20/2023   Total days of inpatient antibiotics 1        Reason for Consult: MRSA bacteremia    Principal Problem:   AKI (acute kidney injury) (HCC) Active Problems:   Hepatic cirrhosis (HCC)   Iron deficiency anemia due to chronic blood loss   Paroxysmal atrial fibrillation (HCC)   Hypertension associated with diabetes (HCC)   MDS (myelodysplastic syndrome) (HCC)   Hypokalemia   Sepsis due to undetermined organism (HCC)   Gram-positive bacteremia   Assessment: 83 year old male with history of CML, A-fib, PE, cirrhosis, diabetes mellitus admitted with #MRSA bacteremia #Right toe infection - On admission patient had temp of 102, WBC 32..  Blood cultures 2/2 sets from admission grew MRSA. - Imaging reviewed includes chest x-ray CT abdomen pelvis showed no acute abnormality - Right foot knee and x-ray showed osteoarthritis - I spoke to wife who states that his right big toe has been erythematous for the last few days.  Denies any trauma to the area.  Suspect this may be source of infection.  Recommendations:  -Continue vancomycin, -Continue ceftriaxone for now pending MRI findings. - Repeat blood cultures to ensure clearance - MRI right foot Microbiology:   Antibiotics: Vanc  Cultures: Blood 2/9 2/2 sets MRSA Urine  Other   HPI: David Duarte is a 83 y.o. male 83 year old male with history of chronic myeloid leukemia, A-fib, pulmonary embolism and hypertension, diabetes, CAD, cirrhosis presented with abdominal pain via EMS.  Patient  has been having abdominal pain for the last week.  Found to have generalized weakness as well.  On arrival temp of 97.7 then developed fever 102, WBC 32K.  Started on vancomycin and ceftriaxone.  Chest x-ray showed no acute abnormality.  CT abdominal pelvis showed no acute focal lesions, stable aorto by iliac endograft repair with stable native aneurysm.  Chest x-ray of knee and right foot showed no acute abnormalities.  ID consulted agrees to MRSA and blood   Review of Systems: ROS  Past Medical History:  Diagnosis Date   Abdominal aortic aneurysm (AAA) (HCC)    a. 10/2021 s/p EVAR.   Abnormal cardiovascular stress test    a. 09/2012 MV: Fixed anteroseptal and inferoseptal defects.  Possible apical ischemia; b. 06/2022 MV: Fixed inferior, inferoseptal, anteroseptal, septal defect with apical reversibility-similar to 2013 study.   Anemia in CKD (chronic kidney disease) 01/22/2023   Anxiety    Arthritis    Cirrhosis (HCC)    Completed Hep A/B vaccinations in 2022   Diastolic dysfunction    a. 06/2022 Echo: EF 60-65%, no rwma, GrII DD, mildly enlarged RV w/ nl fxn. RVSP 45.2mmHg. Mild MR/AS.   Diverticulosis    Dysrhythmia    GERD (gastroesophageal reflux disease)    Hiatal hernia    History of kidney stones    Hypercholesteremia    Iron deficiency anemia due to chronic blood loss 11/21/2022   Paroxysmal atrial fibrillation (HCC)    PE (pulmonary embolism) 2003   Syncope    Type 2 diabetes  mellitus (HCC)     Social History   Tobacco Use   Smoking status: Former    Current packs/day: 0.00    Average packs/day: 1 pack/day for 45.0 years (45.0 ttl pk-yrs)    Types: Cigarettes    Start date: 01/28/1956    Quit date: 01/27/2001    Years since quitting: 22.9    Passive exposure: Never   Smokeless tobacco: Former    Quit date: 11/10/2001  Vaping Use   Vaping status: Never Used  Substance Use Topics   Alcohol use: Not Currently    Comment: Quit in Mid 2020.  Used to drink 6 shots  daily.     Drug use: No    Family History  Problem Relation Age of Onset   Diabetes Father    Kidney disease Father    Alzheimer's disease Mother    Colon cancer Neg Hx    Liver disease Neg Hx    Scheduled Meds:  donepezil  10 mg Oral q morning   lubiprostone  24 mcg Oral BID WC   pantoprazole  40 mg Oral Daily   Continuous Infusions:  sodium chloride Stopped (12/21/23 1004)   0.9 % NaCl with KCl 40 mEq / L 75 mL/hr at 12/21/23 1002   cefTRIAXone (ROCEPHIN)  IV 2 g (12/21/23 1230)   [START ON 12/22/2023] vancomycin     PRN Meds:.acetaminophen **OR** acetaminophen, HYDROcodone-acetaminophen, polyethylene glycol No Known Allergies  OBJECTIVE: Blood pressure (!) 101/48, pulse (!) 104, temperature 100.2 F (37.9 C), temperature source Axillary, resp. rate (!) 24, height 6' (1.829 m), weight 89.8 kg, SpO2 96%.  Physical Exam   Lab Results Lab Results  Component Value Date   WBC 24.0 (H) 12/21/2023   HGB 8.4 (L) 12/21/2023   HCT 28.6 (L) 12/21/2023   MCV 98.3 12/21/2023   PLT 161 12/21/2023    Lab Results  Component Value Date   CREATININE 1.37 (H) 12/21/2023   BUN 39 (H) 12/21/2023   NA 134 (L) 12/21/2023   K 2.9 (L) 12/21/2023   CL 100 12/21/2023   CO2 20 (L) 12/21/2023    Lab Results  Component Value Date   ALT 8 12/20/2023   AST 11 (L) 12/20/2023   ALKPHOS 67 12/20/2023   BILITOT 2.1 (H) 12/20/2023       David Earthly, MD [facility] 12/21/2023, 2:23 PM  Evaluation of this patient requires complex antimicrobial therapy evaluation and counseling + isolation needs for disease transmission risk assessment and mitigation

## 2023-12-22 DIAGNOSIS — M86171 Other acute osteomyelitis, right ankle and foot: Secondary | ICD-10-CM | POA: Diagnosis not present

## 2023-12-22 DIAGNOSIS — A419 Sepsis, unspecified organism: Secondary | ICD-10-CM

## 2023-12-22 DIAGNOSIS — N179 Acute kidney failure, unspecified: Secondary | ICD-10-CM | POA: Diagnosis not present

## 2023-12-22 DIAGNOSIS — B9562 Methicillin resistant Staphylococcus aureus infection as the cause of diseases classified elsewhere: Secondary | ICD-10-CM

## 2023-12-22 LAB — ECHOCARDIOGRAM COMPLETE
AR max vel: 1.5 cm2
AV Area VTI: 1.63 cm2
AV Area mean vel: 1.71 cm2
AV Mean grad: 10 mm[Hg]
AV Peak grad: 19 mm[Hg]
Ao pk vel: 2.18 m/s
Area-P 1/2: 3.37 cm2
Height: 72 in
S' Lateral: 2.9 cm
Weight: 3168 [oz_av]

## 2023-12-22 LAB — CBC
HCT: 30.1 % — ABNORMAL LOW (ref 39.0–52.0)
Hemoglobin: 8.6 g/dL — ABNORMAL LOW (ref 13.0–17.0)
MCH: 28.1 pg (ref 26.0–34.0)
MCHC: 28.6 g/dL — ABNORMAL LOW (ref 30.0–36.0)
MCV: 98.4 fL (ref 80.0–100.0)
Platelets: 150 10*3/uL (ref 150–400)
RBC: 3.06 MIL/uL — ABNORMAL LOW (ref 4.22–5.81)
RDW: 18.8 % — ABNORMAL HIGH (ref 11.5–15.5)
WBC: 23.8 10*3/uL — ABNORMAL HIGH (ref 4.0–10.5)
nRBC: 0.1 % (ref 0.0–0.2)

## 2023-12-22 LAB — BASIC METABOLIC PANEL
Anion gap: 15 (ref 5–15)
BUN: 34 mg/dL — ABNORMAL HIGH (ref 8–23)
CO2: 17 mmol/L — ABNORMAL LOW (ref 22–32)
Calcium: 8.4 mg/dL — ABNORMAL LOW (ref 8.9–10.3)
Chloride: 104 mmol/L (ref 98–111)
Creatinine, Ser: 1.09 mg/dL (ref 0.61–1.24)
GFR, Estimated: >60 mL/min (ref >60)
Glucose, Bld: 136 mg/dL — ABNORMAL HIGH (ref 70–99)
Potassium: 3.6 mmol/L (ref 3.5–5.1)
Sodium: 136 mmol/L (ref 135–145)

## 2023-12-22 LAB — URINE CULTURE: Culture: NO GROWTH

## 2023-12-22 LAB — MAGNESIUM: Magnesium: 2.1 mg/dL (ref 1.7–2.4)

## 2023-12-22 NOTE — H&P (View-Only) (Signed)
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 at the first metatarsophalangeal joint and throughout the midfoot. Soft tissues are unremarkable. IMPRESSION: 1. No acute displaced fracture. 2. Multifocal osteoarthritis. 3. Hammertoe deformities. Electronically Signed   By: Sharlet Salina M.D.   On: 12/20/2023 16:06   CT ABDOMEN PELVIS WO CONTRAST Result Date: 12/20/2023 CLINICAL DATA:  Left lower quadrant abdominal pain. EXAM: CT ABDOMEN AND PELVIS WITHOUT CONTRAST TECHNIQUE: Multidetector CT imaging of the abdomen and pelvis was performed following the standard protocol without IV contrast. RADIATION DOSE REDUCTION: This exam was performed according to the departmental dose-optimization program which includes automated  exposure control, adjustment of the mA and/or kV according to patient size and/or use of iterative reconstruction technique. COMPARISON:  CT angio abdomen and pelvis 10/22/2023 FINDINGS: Lower chest: The lung bases are clear without focal nodule, mass, or airspace disease. The heart size is normal. Extensive coronary artery calcifications are present. No significant pleural or pericardial effusion is present. Hepatobiliary: No focal liver abnormality is seen. No gallstones, gallbladder wall thickening, or biliary dilatation. The gallbladder is moderately distended without inflammatory change. This may be secondary to anorexia. Pancreas: Unremarkable. No pancreatic ductal dilatation or surrounding inflammatory changes. Spleen: The spleen is mildly enlarged, stable. Measures 13 cm  cephalo caudad. Adrenals/Urinary Tract: Adrenal glands are normal bilaterally. A simple cyst along the posterior aspect of the right kidney is stable at 3.1 cm. No other focal lesions are present. No stone or obstruction is present. Ureters are within normal limits. The urinary bladder is normal. Stomach/Bowel: The stomach and duodenum are within normal limits. Small bowel is unremarkable. The terminal ileum is normal. The appendix is visualized and normal. The ascending and transverse colon are within normal limits. Diverticular changes are present in the descending and sigmoid colon. No inflammatory changes are present in the sigmoid colon. 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 Atherosclerotic changes within the femoral arteries beyond the graft in the aorta proximal to the graft are stable. No significant adenopathy is present. Reproductive: The prostate is mildly enlarged, stable, measuring 5.0 cm. Other: No abdominal wall hernia or abnormality. No abdominopelvic ascites. Musculoskeletal: The vertebral body heights and alignment are normal. No focal osseous lesion  is present. Bony pelvis is normal. Hips are located and within normal limits bilaterally. IMPRESSION: 1. No acute or focal lesion to explain the patient's symptoms. 2. Descending and sigmoid diverticulosis without diverticulitis. 3. 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 4. Stable mild splenomegaly. 5. Stable simple cyst of the right kidney. No follow-up imaging recommended. 6. Stable mild prostatomegaly. 7. Coronary artery disease. Electronically Signed   By: Marin Roberts M.D.   On: 12/20/2023 11:35   DG Chest Portable 1 View Result Date: 12/20/2023 CLINICAL DATA:  weakness EXAM: PORTABLE CHEST - 1 VIEW COMPARISON:  None available. FINDINGS: Cardiomediastinal silhouette and pulmonary vasculature are within normal limits. Lungs are clear. IMPRESSION: No acute cardiopulmonary process. Electronically Signed   By: Acquanetta Belling M.D.   On: 12/20/2023 11:20    ROS:  Review of systems not obtained due to patient factors.  Blood pressure (!) 159/86, pulse (!) 101, temperature 97.9 F (36.6 C), temperature source Oral, resp. rate 19, height 6' (1.829 m), weight 89.8 kg, SpO2 100%. Physical Exam: Pleasant white male no acute distress Head is normocephalic, atraumatic Lungs clear to auscultation with equal breath sounds bilaterally Heart examination reveals an irregular rate and rhythm Extremity examination reveals an erythematous right great toe with areas of necrosis.  No open draining wounds noted.  Patient does have palpable dorsalis pedis and posterior tibial pulses.  Right femoral artery pulse noted.  MRI report reviewed  Assessment/Plan: Impression: Osteomyelitis with abscess formation, right great toe, MRSA bacteremia, multiple comorbidities.  He last took his Eliquis 2 days ago. Plan: Will take patient to the operating room tomorrow for right great toe amputation.  The risks and benefits of the procedure were fully explained to the patient, who gave informed  consent.  I will talk to his wife once she is at the hospital.  Continue holding Eliquis.  David Duarte 12/22/2023, 10:15 AM

## 2023-12-22 NOTE — Plan of Care (Signed)

## 2023-12-22 NOTE — Progress Notes (Signed)
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  Blood cultures 2/2 sets from admission grew MRSA. 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  Multilevel fluid collection suspicion for abscesses in the distal dorsal and dorsal lateral aspect of great toe. Plan: - If podiatry not available, engage surgery/ortho for great toe abscess/osteomyelitis -Follow repeat blood cultures to ensure clearance - TEE(TTE noted aortic and pulmonic valve not well below the visualized.) -Continue vancomycin and ceftriaxone

## 2023-12-22 NOTE — Progress Notes (Signed)
PROGRESS NOTE  HORST OSTERMILLER ZOX:[id number] DOB: Nov 16, 1940 DOA: 12/20/2023 PCP: Sonny Masters, FNP  Brief History:   83 y.o. male with medical history significant for chronic myeloid leukemia, atrial fibrillation, pulmonary embolism, hypertension, diabetes mellitus, coronary artery disease, cirrhosis. Patient presented to the ED via EMS with complaints of abdominal pain.  Reports chronic abdominal pain over the past week.  He reports pain is lower abdomen, he also reports some pain when he tried to urinate.  He reports reduced oral intake with generalized weakness, unable to ambulate, and spouse not been able to take care of patient in his current condition.  No vomiting no diarrhea.  No fevers no chills no difficulty breathing.  Reports his last meal was over a week ago.   ED Course: Tmax 97.7.  Heart rate 87-95, respiratory rate 15-27, blood pressure systolic 97-143.  O2 sats mostly greater than 91% on room air. In the ED patient attempted voiding, was able to make urine, but it was blood-tinged, with small blood clots. Chest x-ray clear. CT abdomen/pelvis with contrast negative for acute abnormality. 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  Chest x-ray unremarkable. Creatinine elevated 1.88.  Potassium low 2.8.  Leukocytosis of 32.2. Lactic acid 1.5. 3 L bolus given. Hospitalist to admit for AKI and SIRS   Assessment/Plan: Sepsis -Present on admission -Presented with fever and leukocytosis and tachycardia -Secondary to urinary source and bacteremia -initially on vancomycin and ceftriaxone empirically -Lactic acid 1.5>> 1.4 -Continue IV fluids -Follow blood and urine cultures -Personally reviewed chest x-ray--negative for infiltrates or edema  MRSA bacteremia -due to right foot/toe infection -continue vanco -repeat blood cultures 2/11 -Echo  Right great toe abscess/osteomyelitis -general surgery consult -will need I&D -continue vanc -see pics below    Pyuria/hematuria -UA>50 WBC -Continue ceftriaxone pending culture data -Holding apixaban temporarily   AKI -baseline creatinine 0.8-1.1 -presented with serum creatinine 1.88 -continue IVF>>improved   Paroxysmal atrial fibrillation -Currently in sinus rhythm -Holding carvedilol secondary to soft blood pressure initially -restart coreg 2/11 -Holding apixaban temporarily secondary to hematuria and impending I&D   Myelodysplastic syndrome -Follow-up Dr. Ellin Saba -Patient has history of CMML   Abdominal pain -12/20/2023 CT abdomen and pelvis negative for acute findings. -Abdominal pain is improving   Hypokalemia -Repleting   Iron deficiency anemia due to chronic blood loss Hemoglobin stable  -baseline Hgb 8-9   Hypertension  -holding coreg due to soft BPs intiitally>>restart   Hypokalemia -replete         Family Communication:  no Family at bedside   Consultants:  none   Code Status:  FULL   DVT Prophylaxis:  apixaban on hold     Procedures: As Listed in Progress Note Above   Antibiotics: Vanc 2/9>> Ceftriaxone 2/9>>               Subjective: Pt denies cp, sob, n/v/d, abd pain.  Complains of right great toe pain moderate, worse with palpation.  Objective: Vitals:   12/21/23 1948 12/21/23 2020 12/22/23 0334 12/22/23 0336  BP: (!) 176/85 (!) 149/88 (!) 171/88 (!) 159/86  Pulse: 94  (!) 101   Resp:  19    Temp: 97.7 F (36.5 C)  97.9 F (36.6 C)   TempSrc: Oral  Oral   SpO2: 100%  100%   Weight:      Height:        Intake/Output Summary (Last 24 hours) at 12/22/2023 8119 Last data filed  at 12/22/2023 0336 Gross per 24 hour  Intake --  Output 600 ml  Net -600 ml   Weight change:  Exam:  General:  Pt is alert, follows commands appropriately, not in acute distress HEENT: No icterus, No thrush, No neck mass, [place]/AT Cardiovascular: RRR, S1/S2, no rubs, no gallops Respiratory: bibasilar rales. No wheeze Abdomen: Soft/+BS, non  tender, non distended, no guarding Extremities: No edema,          Data Reviewed: I have personally reviewed following labs and imaging studies Basic Metabolic Panel: Recent Labs  Lab 12/20/23 1010 12/20/23 1918 12/21/23 0324 12/22/23 0532  NA 133*  --  134* 136  K 2.8*  --  2.9* 3.6  CL 92*  --  100 104  CO2 24  --  20* 17*  GLUCOSE 148*  --  140* 136*  BUN 42*  --  39* 34*  CREATININE 1.88*  --  1.37* 1.09  CALCIUM 9.1  --  8.3* 8.4*  MG  --  1.7  --  2.1   Liver Function Tests: Recent Labs  Lab 12/20/23 1010  AST 11*  ALT 8  ALKPHOS 67  BILITOT 2.1*  PROT 8.3*  ALBUMIN 3.0*   Recent Labs  Lab 12/20/23 1010  LIPASE 25   No results for input(s): "AMMONIA" in the last 168 hours. Coagulation Profile: No results for input(s): "INR", "PROTIME" in the last 168 hours. CBC: Recent Labs  Lab 12/20/23 1010 12/21/23 0324 12/22/23 0532  WBC 32.2* 24.0* 23.8*  HGB 10.7* 8.4* 8.6*  HCT 35.1* 28.6* 30.1*  MCV 95.6 98.3 98.4  PLT 209 161 150   Cardiac Enzymes: No results for input(s): "CKTOTAL", "CKMB", "CKMBINDEX", "TROPONINI" in the last 168 hours. BNP: Invalid input(s): "POCBNP" CBG: No results for input(s): "GLUCAP" in the last 168 hours. HbA1C: No results for input(s): "HGBA1C" in the last 72 hours. Urine analysis:    Component Value Date/Time   COLORURINE AMBER (A) 12/20/2023 1600   APPEARANCEUR HAZY (A) 12/20/2023 1600   LABSPEC 1.024 12/20/2023 1600   PHURINE 5.0 12/20/2023 1600   GLUCOSEU NEGATIVE 12/20/2023 1600   HGBUR LARGE (A) 12/20/2023 1600   BILIRUBINUR NEGATIVE 12/20/2023 1600   KETONESUR NEGATIVE 12/20/2023 1600   PROTEINUR 100 (A) 12/20/2023 1600   NITRITE NEGATIVE 12/20/2023 1600   LEUKOCYTESUR NEGATIVE 12/20/2023 1600   Sepsis Labs: @LABRCNTIP (procalcitonin:4,lacticidven:4) ) Recent Results (from the past 240 hours)  Blood culture (routine x 2)     Status: None (Preliminary result)   Collection Time: 12/20/23  2:34 PM    Specimen: BLOOD LEFT ARM  Result Value Ref Range Status   Specimen Description   Final    BLOOD LEFT ARM BOTTLES DRAWN AEROBIC AND ANAEROBIC Performed at [facility], [address] [place], [place] [address]    Special Requests   Final    Blood Culture adequate volume Performed at [facility], [address], [place], [place] [address]    Culture  Setup Time   Final    GRAM POSITIVE COCCI IN BOTH AEROBIC AND ANAEROBIC BOTTLES CRITICAL RESULT CALLED TO, READ BACK BY AND VERIFIED WITH: ELLER,J ON 12/21/23 @ 0419 BY PURDIE,J CRITICAL RESULT CALLED TO, READ BACK BY AND VERIFIED WITH: PHARMD S.HURTH AT 12/21/2023 BY T.SAAD. Performed at [facility], [address], [place], [place] [address]    Culture [facility] POSITIVE COCCI  Final   Report Status PENDING  Incomplete  Blood Culture ID Panel (Reflexed)     Status: Abnormal  Collection Time: 12/20/23  2:34 PM  Result Value Ref Range Status   Enterococcus faecalis NOT DETECTED NOT DETECTED Final   Enterococcus Faecium NOT DETECTED NOT DETECTED Final   Listeria monocytogenes NOT DETECTED NOT DETECTED Final   Staphylococcus species DETECTED (A) NOT DETECTED Final    Comment: CRITICAL RESULT CALLED TO, READ BACK BY AND VERIFIED WITH: PHARMD S.HURTH AT 12/21/2023 BY T.SAAD.    Staphylococcus aureus (BCID) DETECTED (A) NOT DETECTED Final    Comment: Methicillin (oxacillin)-resistant Staphylococcus aureus (MRSA). MRSA is predictably resistant to beta-lactam antibiotics (except ceftaroline). Preferred therapy is vancomycin unless clinically contraindicated. Patient requires contact precautions if  hospitalized. CRITICAL RESULT CALLED TO, READ BACK BY AND VERIFIED WITH: PHARMD S.HURTH AT 12/21/2023 BY T.SAAD.    Staphylococcus epidermidis NOT DETECTED NOT DETECTED Final   Staphylococcus lugdunensis NOT DETECTED NOT DETECTED Final   Streptococcus species NOT DETECTED NOT DETECTED Final   Streptococcus agalactiae NOT DETECTED NOT DETECTED  Final   Streptococcus pneumoniae NOT DETECTED NOT DETECTED Final   Streptococcus pyogenes NOT DETECTED NOT DETECTED Final   A.calcoaceticus-baumannii NOT DETECTED NOT DETECTED Final   Bacteroides fragilis NOT DETECTED NOT DETECTED Final   Enterobacterales NOT DETECTED NOT DETECTED Final   Enterobacter cloacae complex NOT DETECTED NOT DETECTED Final   Escherichia coli NOT DETECTED NOT DETECTED Final   Klebsiella aerogenes NOT DETECTED NOT DETECTED Final   Klebsiella oxytoca NOT DETECTED NOT DETECTED Final   Klebsiella pneumoniae NOT DETECTED NOT DETECTED Final   Proteus species NOT DETECTED NOT DETECTED Final   Salmonella species NOT DETECTED NOT DETECTED Final   Serratia marcescens NOT DETECTED NOT DETECTED Final   Haemophilus influenzae NOT DETECTED NOT DETECTED Final   Neisseria meningitidis NOT DETECTED NOT DETECTED Final   Pseudomonas aeruginosa NOT DETECTED NOT DETECTED Final   Stenotrophomonas maltophilia NOT DETECTED NOT DETECTED Final   Candida albicans NOT DETECTED NOT DETECTED Final   Candida auris NOT DETECTED NOT DETECTED Final   Candida glabrata NOT DETECTED NOT DETECTED Final   Candida krusei NOT DETECTED NOT DETECTED Final   Candida parapsilosis NOT DETECTED NOT DETECTED Final   Candida tropicalis NOT DETECTED NOT DETECTED Final   Cryptococcus neoformans/gattii NOT DETECTED NOT DETECTED Final   Meth resistant mecA/C and MREJ DETECTED (A) NOT DETECTED Final    Comment: CRITICAL RESULT CALLED TO, READ BACK BY AND VERIFIED WITH: PHARMD S.HURTH AT 12/21/2023 BY T.SAAD. Performed at [facility], [address] [place], [place] [address]   Blood culture (routine x 2)     Status: None (Preliminary result)   Collection Time: 12/20/23  2:36 PM   Specimen: Left Antecubital; Blood  Result Value Ref Range Status   Specimen Description   Final    LEFT ANTECUBITAL BOTTLES DRAWN AEROBIC AND ANAEROBIC Performed at [facility], [address] [place], [place] [address]     Special Requests   Final    Blood Culture results may not be optimal due to an inadequate volume of blood received in culture bottles Performed at [facility], [address], [place], [place] [address]    Culture  Setup Time   Final    GRAM POSITIVE COCCI IN BOTH AEROBIC AND ANAEROBIC BOTTLES CRITICAL RESULT CALLED TO, READ BACK BY AND VERIFIED WITH: ELLER,J ON 12/21/23 AT 0346 BY PURDIE,J CRITICAL VALUE NOTED.  VALUE IS CONSISTENT WITH PREVIOUSLY REPORTED AND CALLED VALUE. Performed at [facility], [address], [place], [place] [address]    Culture GRAM POSITIVE  COCCI  Final   Report Status PENDING  Incomplete     Scheduled Meds:  donepezil  10 mg Oral q morning   lubiprostone  24 mcg Oral BID WC   pantoprazole  40 mg Oral Daily   Continuous Infusions:  0.9 % NaCl with KCl 40 mEq / L 75 mL/hr at 12/22/23 0249   cefTRIAXone (ROCEPHIN)  IV 2 g (12/21/23 1230)   vancomycin 1,500 mg (12/22/23 0534)    Procedures/Studies: MR FOOT RIGHT W WO CONTRAST Result Date: 12/21/2023 CLINICAL DATA:  2nd toe pain. 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 EXAM: MRI OF THE RIGHT FOREFOOT WITHOUT AND WITH CONTRAST TECHNIQUE: Multiplanar, multisequence MR imaging of the right forefoot was performed before and after the administration of intravenous contrast. CONTRAST:  9mL GADAVIST GADOBUTROL 1 MMOL/ML IV SOLN COMPARISON:  Right foot radiographs 12/20/2023 FINDINGS: Soft tissues A there is mild-to-moderate soft tissue swelling of the great toe, greatest at the distal lateral aspect. There is multilobular decreased T1 increased T2 signal within the region of the distal dorsal lateral aspect of the great toe, both under the distal 11 mm of the toenail (coronal series 9, image 11 and axial series 5, image 46) and bordering the dorsolateral aspect of the distal phalanx of the great toe (coronal series 9 images 11 through 13 and axial series 5 images 41 through 45). There is mild  subcutaneous fat edema and swelling of the dorsal forefoot subcutaneous fat. Bones/Joint/Cartilage Adjacent to the likely abscesses in the distal dorsal and distal dorsolateral aspect of the great toe, there is mild marrow edema within the distal greater than proximal aspect of the great toe distal phalanx concerning for acute osteomyelitis (sagittal series 8 images 20 through 22). Moderate great toe metatarsophalangeal joint space narrowing, cartilage thinning, and peripheral osteophytosis. There is a well corticated chronic ossicle measuring up to 10 mm dorsal to the distal first metatarsal head and neck. Mild likely degenerative marrow edema within the deep aspect of the lateral great toe metatarsophalangeal joint sesamoid (series 5, image 26). Moderate to high-grade cartilage thinning with mild-to-moderate peripheral osteophytes of the navicular-cuneiform articulations. Within the limitations of inhomogeneous fat saturation at the distal aspect of the second through fifth toes, no definitive additional phalangeal marrow edema is seen to indicate evidence of acute osteomyelitis. Ligaments The Lisfranc ligament complex is intact. The metatarsophalangeal and interphalangeal collateral ligaments are intact. The sesamoid-phalangeal ligaments of the great toe plantar plate are intact. Muscles and Tendons There is moderate edema within the plantar forefoot musculature, nonspecific. IMPRESSION: 1. Mild-to-moderate soft tissue swelling of the great toe, greatest at the distal lateral aspect. There are multilobular fluid collections suspicious for abscesses within the distal dorsal and distal dorsolateral aspect of the great toe, both under the distal 11 mm of the toenail and bordering the dorsolateral aspect of the distal phalanx of the great toe. 2. There is mild marrow edema within the distal greater than proximal aspect of the great toe distal phalanx concerning for acute osteomyelitis. 3. Moderate great toe  metatarsophalangeal joint osteoarthritis. Electronically Signed   By: Neita Garnet M.D.   On: 12/21/2023 17:44   DG Knee Complete 4 Views Right Result Date: 12/20/2023 CLINICAL DATA:  Larey Seat EXAM: RIGHT KNEE - COMPLETE 4+ VIEW COMPARISON:  None Available. FINDINGS: Frontal, bilateral oblique, lateral views of the right knee are obtained. No fracture, subluxation, or dislocation. Three compartmental osteoarthritis, most pronounced within the medial compartment with moderate joint space narrowing and osteophyte formation. No joint  effusion. Diffuse atherosclerosis. Soft tissues are unremarkable. IMPRESSION: 1. Moderate 3 compartmental osteoarthritis. 2. No acute fracture. 3. Atherosclerosis. Electronically Signed   By: Sharlet Salina M.D.   On: 12/20/2023 16:06   DG Foot Complete Right Result Date: 12/20/2023 CLINICAL DATA:  Larey Seat EXAM: RIGHT FOOT COMPLETE - 3+ VIEW COMPARISON:  None Available. FINDINGS: Frontal, oblique, and lateral views of the right foot are obtained. Hammertoe deformities are noted. There are no acute displaced fractures. Multifocal osteoarthritis, greatest at the first metatarsophalangeal joint and throughout the midfoot. Soft tissues are unremarkable. IMPRESSION: 1. No acute displaced fracture. 2. Multifocal osteoarthritis. 3. Hammertoe deformities. Electronically Signed   By: Sharlet Salina M.D.   On: 12/20/2023 16:06   CT ABDOMEN PELVIS WO CONTRAST Result Date: 12/20/2023 CLINICAL DATA:  Left lower quadrant abdominal pain. EXAM: CT ABDOMEN AND PELVIS WITHOUT CONTRAST TECHNIQUE: Multidetector CT imaging of the abdomen and pelvis was performed following the standard protocol without IV contrast. RADIATION DOSE REDUCTION: This exam was performed according to the departmental dose-optimization program which includes automated exposure control, adjustment of the mA and/or kV according to patient size and/or use of iterative reconstruction technique. COMPARISON:  CT angio abdomen and pelvis  10/22/2023 FINDINGS: Lower chest: The lung bases are clear without focal nodule, mass, or airspace disease. The heart size is normal. Extensive coronary artery calcifications are present. No significant pleural or pericardial effusion is present. Hepatobiliary: No focal liver abnormality is seen. No gallstones, gallbladder wall thickening, or biliary dilatation. The gallbladder is moderately distended without inflammatory change. This may be secondary to anorexia. Pancreas: Unremarkable. No pancreatic ductal dilatation or surrounding inflammatory changes. Spleen: The spleen is mildly enlarged, stable. Measures 13 cm cephalo caudad. Adrenals/Urinary Tract: Adrenal glands are normal bilaterally. A simple cyst along the posterior aspect of the right kidney is stable at 3.1 cm. No other focal lesions are present. No stone or obstruction is present. Ureters are within normal limits. The urinary bladder is normal. Stomach/Bowel: The stomach and duodenum are within normal limits. Small bowel is unremarkable. The terminal ileum is normal. The appendix is visualized and normal. The ascending and transverse colon are within normal limits. Diverticular changes are present in the descending and sigmoid colon. No inflammatory changes are present in the sigmoid colon. 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 Atherosclerotic changes within the femoral arteries beyond the graft in the aorta proximal to the graft are stable. No significant adenopathy is present. Reproductive: The prostate is mildly enlarged, stable, measuring 5.0 cm. Other: No abdominal wall hernia or abnormality. No abdominopelvic ascites. Musculoskeletal: The vertebral body heights and alignment are normal. No focal osseous lesion is present. Bony pelvis is normal. Hips are located and within normal limits bilaterally. IMPRESSION: 1. No acute or focal lesion to explain the patient's symptoms.  2. Descending and sigmoid diverticulosis without diverticulitis. 3. 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 4. Stable mild splenomegaly. 5. Stable simple cyst of the right kidney. No follow-up imaging recommended. 6. Stable mild prostatomegaly. 7. Coronary artery disease. Electronically Signed   By: Marin Roberts M.D.   On: 12/20/2023 11:35   DG Chest Portable 1 View Result Date: 12/20/2023 CLINICAL DATA:  weakness EXAM: PORTABLE CHEST - 1 VIEW COMPARISON:  None available. FINDINGS: Cardiomediastinal silhouette and pulmonary vasculature are within normal limits. Lungs are clear. IMPRESSION: No acute cardiopulmonary process. Electronically Signed   By: Acquanetta Belling M.D.   On: 12/20/2023  11:20   DG Chest 2 View Result Date: 11/23/2023 CLINICAL DATA:  Chest pain EXAM: CHEST - 2 VIEW COMPARISON:  X-ray 01/22/2017 FINDINGS: The heart size and mediastinal contours are within normal limits. No consolidation, pneumothorax or effusion. No edema. Films are underinflated. Degenerative changes of the spine. Aortic endograft seen at the edge of the imaging field. The visualized skeletal structures are unremarkable. IMPRESSION: No acute cardiopulmonary disease.  Underinflation. Electronically Signed   By: Karen Kays M.D.   On: 11/23/2023 14:26    Catarina Hartshorn, DO  [facility]  If 7PM-7AM, please contact night-coverage [email or web address] Password [email or web address] 12/22/2023, 6:13 AM   LOS: 2 days

## 2023-12-22 NOTE — Consult Note (Signed)
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 osteoarthritis, greatest at the first metatarsophalangeal joint and throughout the midfoot. Soft tissues are unremarkable. IMPRESSION: 1. No acute displaced fracture. 2. Multifocal osteoarthritis. 3. Hammertoe deformities. Electronically Signed   By: Sharlet Salina M.D.   On: 12/20/2023 16:06   CT ABDOMEN PELVIS WO CONTRAST Result Date: 12/20/2023 CLINICAL DATA:  Left lower quadrant abdominal pain. EXAM: CT ABDOMEN AND PELVIS WITHOUT CONTRAST TECHNIQUE: Multidetector CT imaging of the abdomen and pelvis was performed following the standard protocol without IV contrast. RADIATION DOSE REDUCTION: This exam was performed according to the departmental dose-optimization program which includes automated  exposure control, adjustment of the mA and/or kV according to patient size and/or use of iterative reconstruction technique. COMPARISON:  CT angio abdomen and pelvis 10/22/2023 FINDINGS: Lower chest: The lung bases are clear without focal nodule, mass, or airspace disease. The heart size is normal. Extensive coronary artery calcifications are present. No significant pleural or pericardial effusion is present. Hepatobiliary: No focal liver abnormality is seen. No gallstones, gallbladder wall thickening, or biliary dilatation. The gallbladder is moderately distended without inflammatory change. This may be secondary to anorexia. Pancreas: Unremarkable. No pancreatic ductal dilatation or surrounding inflammatory changes. Spleen: The spleen is mildly enlarged, stable. Measures 13 cm  cephalo caudad. Adrenals/Urinary Tract: Adrenal glands are normal bilaterally. A simple cyst along the posterior aspect of the right kidney is stable at 3.1 cm. No other focal lesions are present. No stone or obstruction is present. Ureters are within normal limits. The urinary bladder is normal. Stomach/Bowel: The stomach and duodenum are within normal limits. Small bowel is unremarkable. The terminal ileum is normal. The appendix is visualized and normal. The ascending and transverse colon are within normal limits. Diverticular changes are present in the descending and sigmoid colon. No inflammatory changes are present in the sigmoid colon. 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 Atherosclerotic changes within the femoral arteries beyond the graft in the aorta proximal to the graft are stable. No significant adenopathy is present. Reproductive: The prostate is mildly enlarged, stable, measuring 5.0 cm. Other: No abdominal wall hernia or abnormality. No abdominopelvic ascites. Musculoskeletal: The vertebral body heights and alignment are normal. No focal osseous lesion  is present. Bony pelvis is normal. Hips are located and within normal limits bilaterally. IMPRESSION: 1. No acute or focal lesion to explain the patient's symptoms. 2. Descending and sigmoid diverticulosis without diverticulitis. 3. 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 4. Stable mild splenomegaly. 5. Stable simple cyst of the right kidney. No follow-up imaging recommended. 6. Stable mild prostatomegaly. 7. Coronary artery disease. Electronically Signed   By: Marin Roberts M.D.   On: 12/20/2023 11:35   DG Chest Portable 1 View Result Date: 12/20/2023 CLINICAL DATA:  weakness EXAM: PORTABLE CHEST - 1 VIEW COMPARISON:  None available. FINDINGS: Cardiomediastinal silhouette and pulmonary vasculature are within normal limits. Lungs are clear. IMPRESSION: No acute cardiopulmonary process. Electronically Signed   By: Acquanetta Belling M.D.   On: 12/20/2023 11:20    ROS:  Review of systems not obtained due to patient factors.  Blood pressure (!) 159/86, pulse (!) 101, temperature 97.9 F (36.6 C), temperature source Oral, resp. rate 19, height 6' (1.829 m), weight 89.8 kg, SpO2 100%. Physical Exam: Pleasant white male no acute distress Head is normocephalic, atraumatic Lungs clear to auscultation with equal breath sounds bilaterally Heart examination reveals an irregular rate and rhythm Extremity examination reveals an erythematous right great toe with areas of necrosis.  No open draining wounds noted.  Patient does have palpable dorsalis pedis and posterior tibial pulses.  Right femoral artery pulse noted.  MRI report reviewed  Assessment/Plan: Impression: Osteomyelitis with abscess formation, right great toe, MRSA bacteremia, multiple comorbidities.  He last took his Eliquis 2 days ago. Plan: Will take patient to the operating room tomorrow for right great toe amputation.  The risks and benefits of the procedure were fully explained to the patient, who gave informed  consent.  I will talk to his wife once she is at the hospital.  Continue holding Eliquis.  David Duarte 12/22/2023, 10:15 AM

## 2023-12-23 ENCOUNTER — Inpatient Hospital Stay (HOSPITAL_COMMUNITY): Payer: Self-pay | Admitting: Certified Registered Nurse Anesthetist

## 2023-12-23 ENCOUNTER — Other Ambulatory Visit: Payer: Self-pay

## 2023-12-23 ENCOUNTER — Encounter (HOSPITAL_COMMUNITY): Payer: Self-pay | Admitting: Internal Medicine

## 2023-12-23 ENCOUNTER — Encounter (HOSPITAL_COMMUNITY)
Admission: EM | Disposition: A | Discharge status: Hospice | Payer: Self-pay | Source: Home / Self Care | Attending: Family Medicine

## 2023-12-23 DIAGNOSIS — N179 Acute kidney failure, unspecified: Secondary | ICD-10-CM

## 2023-12-23 DIAGNOSIS — Z87891 Personal history of nicotine dependence: Secondary | ICD-10-CM

## 2023-12-23 DIAGNOSIS — E1169 Type 2 diabetes mellitus with other specified complication: Secondary | ICD-10-CM

## 2023-12-23 DIAGNOSIS — M869 Osteomyelitis, unspecified: Secondary | ICD-10-CM | POA: Diagnosis not present

## 2023-12-23 DIAGNOSIS — M86171 Other acute osteomyelitis, right ankle and foot: Secondary | ICD-10-CM | POA: Diagnosis not present

## 2023-12-23 DIAGNOSIS — I2510 Atherosclerotic heart disease of native coronary artery without angina pectoris: Secondary | ICD-10-CM | POA: Diagnosis not present

## 2023-12-23 HISTORY — PX: AMPUTATION TOE: SHX6595

## 2023-12-23 LAB — CBC
HCT: 30.4 % — ABNORMAL LOW (ref 39.0–52.0)
Hemoglobin: 8.5 g/dL — ABNORMAL LOW (ref 13.0–17.0)
MCH: 28.1 pg (ref 26.0–34.0)
MCHC: 28 g/dL — ABNORMAL LOW (ref 30.0–36.0)
MCV: 100.7 fL — ABNORMAL HIGH (ref 80.0–100.0)
Platelets: 149 10*3/uL — ABNORMAL LOW (ref 150–400)
RBC: 3.02 MIL/uL — ABNORMAL LOW (ref 4.22–5.81)
RDW: 18.6 % — ABNORMAL HIGH (ref 11.5–15.5)
WBC: 19.8 10*3/uL — ABNORMAL HIGH (ref 4.0–10.5)
nRBC: 0.4 % — ABNORMAL HIGH (ref 0.0–0.2)

## 2023-12-23 LAB — BASIC METABOLIC PANEL
Anion gap: 12 (ref 5–15)
BUN: 27 mg/dL — ABNORMAL HIGH (ref 8–23)
CO2: 21 mmol/L — ABNORMAL LOW (ref 22–32)
Calcium: 8.4 mg/dL — ABNORMAL LOW (ref 8.9–10.3)
Chloride: 104 mmol/L (ref 98–111)
Creatinine, Ser: 0.91 mg/dL (ref 0.61–1.24)
GFR, Estimated: >60 mL/min (ref >60)
Glucose, Bld: 136 mg/dL — ABNORMAL HIGH (ref 70–99)
Potassium: 3.3 mmol/L — ABNORMAL LOW (ref 3.5–5.1)
Sodium: 137 mmol/L (ref 135–145)

## 2023-12-23 LAB — MAGNESIUM: Magnesium: 2 mg/dL (ref 1.7–2.4)

## 2023-12-23 LAB — CULTURE, BLOOD (ROUTINE X 2)

## 2023-12-23 LAB — GLUCOSE, CAPILLARY
Glucose-Capillary: 142 mg/dL — ABNORMAL HIGH (ref 70–99)
Glucose-Capillary: 130 mg/dL — ABNORMAL HIGH (ref 70–99)

## 2023-12-23 LAB — MRSA NEXT GEN BY PCR, NASAL: MRSA by PCR Next Gen: DETECTED — AB

## 2023-12-23 SURGERY — AMPUTATION, TOE
Anesthesia: Monitor Anesthesia Care | Site: Toe | Laterality: Right

## 2023-12-23 MED ORDER — ORAL CARE MOUTH RINSE: 15.0000 mL | OROMUCOSAL | Status: DC | PRN

## 2023-12-23 MED ORDER — PROPOFOL 500 MG/50ML IV EMUL
INTRAVENOUS | Status: DC | PRN
  Administered 2023-12-23: 25 ug/kg/min via INTRAVENOUS

## 2023-12-23 MED ORDER — FENTANYL CITRATE (PF) 100 MCG/2ML IJ SOLN
INTRAMUSCULAR | Status: AC
  Filled 2023-12-23: qty 2

## 2023-12-23 MED ORDER — FENTANYL CITRATE (PF) 100 MCG/2ML IJ SOLN
INTRAMUSCULAR | Status: DC | PRN
  Administered 2023-12-23: 25 ug via INTRAVENOUS

## 2023-12-23 MED ORDER — LIDOCAINE HCL (PF) 1 % IJ SOLN
INTRAMUSCULAR | Status: AC
  Filled 2023-12-23: qty 30

## 2023-12-23 MED ORDER — LIDOCAINE HCL 1 % IJ SOLN
INTRAMUSCULAR | Status: DC | PRN
  Administered 2023-12-23: 12 mL

## 2023-12-23 MED ORDER — LACTATED RINGERS IV SOLN: INTRAVENOUS | Status: DC

## 2023-12-23 MED ORDER — CHLORHEXIDINE GLUCONATE 0.12 % MT SOLN
15.0000 mL | Freq: Once | OROMUCOSAL | Status: AC
  Administered 2023-12-23: 15 mL via OROMUCOSAL

## 2023-12-23 MED ORDER — ESCITALOPRAM OXALATE 10 MG PO TABS
20.0000 mg | ORAL_TABLET | Freq: Every morning | ORAL | Status: DC
  Administered 2023-12-23 – 2023-12-25 (×3): 20 mg via ORAL
  Filled 2023-12-23 (×5): qty 2

## 2023-12-23 MED ORDER — CHLORHEXIDINE GLUCONATE CLOTH 2 % EX PADS
6.0000 | MEDICATED_PAD | Freq: Once | CUTANEOUS | Status: AC
  Administered 2023-12-23: 6 via TOPICAL

## 2023-12-23 MED ORDER — SODIUM CHLORIDE 0.9 % IV SOLN: INTRAVENOUS | Status: DC | PRN

## 2023-12-23 MED ORDER — CEFTRIAXONE SODIUM 1 G IJ SOLR
INTRAMUSCULAR | Status: DC | PRN
  Administered 2023-12-23: 2 g via INTRAVENOUS

## 2023-12-23 MED ORDER — ACETAMINOPHEN 325 MG PO TABS: 650.0000 mg | ORAL_TABLET | Freq: Once | ORAL | Status: DC

## 2023-12-23 MED ORDER — PROPOFOL 10 MG/ML IV BOLUS
INTRAVENOUS | Status: DC | PRN
  Administered 2023-12-23: 25 mg via INTRAVENOUS
  Administered 2023-12-23: 28 mg via INTRAVENOUS

## 2023-12-23 MED ORDER — POTASSIUM CHLORIDE 10 MEQ/100ML IV SOLN
10.0000 meq | INTRAVENOUS | Status: AC
  Administered 2023-12-23 (×3): 10 meq via INTRAVENOUS
  Filled 2023-12-23 (×3): qty 100

## 2023-12-23 MED ORDER — MORPHINE SULFATE (PF) 2 MG/ML IV SOLN
2.0000 mg | Freq: Once | INTRAVENOUS | Status: AC | PRN
  Administered 2023-12-23: 2 mg via INTRAVENOUS
  Filled 2023-12-23: qty 1

## 2023-12-23 MED ORDER — SODIUM CHLORIDE 0.9 % IR SOLN
Status: DC | PRN
  Administered 2023-12-23: 500 mL

## 2023-12-23 MED ORDER — LIDOCAINE HCL (PF) 2 % IJ SOLN
INTRAMUSCULAR | Status: AC
  Filled 2023-12-23: qty 5

## 2023-12-23 MED ORDER — ORAL CARE MOUTH RINSE: 15.0000 mL | Freq: Once | OROMUCOSAL | Status: AC

## 2023-12-23 MED ORDER — PROPOFOL 500 MG/50ML IV EMUL: INTRAVENOUS | Status: DC | PRN

## 2023-12-23 MED ORDER — DIPHENHYDRAMINE HCL 25 MG PO CAPS
25.0000 mg | ORAL_CAPSULE | Freq: Once | ORAL | Status: DC
Start: 2023-12-23 — End: 2023-12-25

## 2023-12-23 SURGICAL SUPPLY — 27 items
BLADE OSC/SAGITTAL MD 9X18.5 (BLADE) IMPLANT
BNDG ELASTIC 4X5.8 VLCR NS LF (GAUZE/BANDAGES/DRESSINGS) IMPLANT
BNDG GAUZE DERMACEA FLUFF 4 (GAUZE/BANDAGES/DRESSINGS) IMPLANT
COVER LIGHT HANDLE STERIS (MISCELLANEOUS) ×4 IMPLANT
ELECT REM PT RETURN 9FT ADLT (ELECTROSURGICAL) ×1 IMPLANT
ELECTRODE REM PT RTRN 9FT ADLT (ELECTROSURGICAL) ×2 IMPLANT
GAUZE SPONGE 2X2 STRL 8-PLY (GAUZE/BANDAGES/DRESSINGS) IMPLANT
GAUZE SPONGE 4X4 12PLY STRL (GAUZE/BANDAGES/DRESSINGS) ×2 IMPLANT
GAUZE XEROFORM 1X8 LF (GAUZE/BANDAGES/DRESSINGS) IMPLANT
GLOVE BIOGEL PI IND STRL 7.0 (GLOVE) ×4 IMPLANT
GLOVE SURG SS PI 7.5 STRL IVOR (GLOVE) ×4 IMPLANT
GOWN STRL REUS W/TWL LRG LVL3 (GOWN DISPOSABLE) ×6 IMPLANT
KIT TURNOVER KIT A (KITS) ×2 IMPLANT
MANIFOLD NEPTUNE II (INSTRUMENTS) ×2 IMPLANT
NDL HYPO 21X1.5 SAFETY (NEEDLE) ×2 IMPLANT
NEEDLE HYPO 21X1.5 SAFETY (NEEDLE) ×1 IMPLANT
NS IRRIG 1000ML POUR BTL (IV SOLUTION) ×2 IMPLANT
PACK BASIC LIMB (CUSTOM PROCEDURE TRAY) ×2 IMPLANT
PAD ARMBOARD 7.5X6 YLW CONV (MISCELLANEOUS) ×2 IMPLANT
POSITIONER HEAD 8X9X4 ADT (SOFTGOODS) ×2 IMPLANT
RASP SM TEAR CROSS CUT (RASP) IMPLANT
SET BASIN LINEN APH (SET/KITS/TRAYS/PACK) ×2 IMPLANT
SOL PREP PROV IODINE SCRUB 4OZ (MISCELLANEOUS) ×2 IMPLANT
SUT PROLENE 3 0 PS 1 (SUTURE) IMPLANT
SWAB CULTURE ESWAB REG 1ML (MISCELLANEOUS) IMPLANT
SYR BULB IRRIG 60ML STRL (SYRINGE) ×2 IMPLANT
SYR CONTROL 10ML LL (SYRINGE) ×2 IMPLANT

## 2023-12-23 NOTE — Op Note (Signed)
Patient:  David Duarte  DOB:  [birth year]  MRN:  [id number]   Preop Diagnosis: Osteomyelitis with gangrene, right great toe  Postop Diagnosis: Same  Procedure: Transmetatarsal amputation of right great toe  Surgeon: Franky Macho, MD  Anes: MAC  Indications: Patient is an 83 year old white male who was diagnosed with MRSA bacteremia who was found to have osteomyelitis and acute cellulitis with abscess formation in the right great toe.  The risks and benefits of the procedure were fully explained to the patient, who gave informed consent.  Procedure note: The patient was placed in the supine position.  After monitored anesthesia care was given, the right foot was prepped and draped using the usual sterile technique with Betadine.  Surgical site confirmation was performed.  1% Xylocaine was used for a toe block.  An elliptical incision was made around the base of the right great toe.  This was taken down to the metatarsal head.  A transmetatarsal amputation was then performed using a reciprocating saw.  The toe was then removed from the operative field.  An aerobic and anaerobic culture was taken of the abscess cavity in the right great toe.  The wound was irrigated with normal saline.  And bleeding was controlled using Bovie electrocautery.  A rasp was used to freshen the edges of the metatarsal bone.  Xeroform gauze was placed along the base of the wound.  The skin was then closed using 2-0 Prolene vertical mattress sutures.  A dry dressing was applied.  All tape and needle counts were correct at the end of the procedure.  The patient was transferred to PACU in stable condition.  Complications: None  EBL: Minimal  Specimen: Right great toe

## 2023-12-23 NOTE — Transfer of Care (Signed)
Immediate Anesthesia Transfer of Care Note  Patient: David Duarte  Procedure(s) Performed: AMPUTATION TOE (Right: Toe)  Patient Location: PACU  Anesthesia Type:General  Level of Consciousness: awake  Airway & Oxygen Therapy: Patient Spontanous Breathing  Post-op Assessment: Report given to RN  Post vital signs: Reviewed and stable  Last Vitals:  Vitals Value Taken Time  BP 122/81 12/23/23 1145  Temp    Pulse 101 12/23/23 1146  Resp 25 12/23/23 1146  SpO2 98 % 12/23/23 1146  Vitals shown include unfiled device data.  Last Pain:  Vitals:   12/23/23 0911  TempSrc:   PainSc: 0-No pain      Patients Stated Pain Goal: 3 (12/23/23 1610)  Complications: No notable events documented.

## 2023-12-23 NOTE — Interval H&P Note (Signed)
History and Physical Interval Note:  12/23/2023 9:12 AM  David Duarte  has presented today for surgery, with the diagnosis of gangrene right great toe.  The various methods of treatment have been discussed with the patient and family. After consideration of risks, benefits and other options for treatment, the patient has consented to  Procedure(s) with comments: AMPUTATION TOE (Right) - great toe as a surgical intervention.  The patient's history has been reviewed, patient examined, no change in status, stable for surgery.  I have reviewed the patient's chart and labs.  Questions were answered to the patient's satisfaction.     Franky Macho

## 2023-12-23 NOTE — Progress Notes (Signed)
    [facility] has been requested to perform a transesophageal echocardiogram on David Duarte for bacteremia.    The patient does NOT have any absolute or relative contraindications to a Transesophageal Echocardiogram (TEE).  The patient has: No other conditions that may impact this procedure.  After careful review of history and examination, the risks and benefits of transesophageal echocardiogram have been explained including risks of esophageal damage, perforation (1:10,000 risk), bleeding, pharyngeal hematoma as well as other potential complications associated with conscious sedation including aspiration, arrhythmia, respiratory failure and death. Alternatives to treatment were discussed, questions were answered. Patient is willing to proceed.   Discussed with the patient's wife at the bedside as he has been experiencing intermittent confusion. Risks and benefits discussed with her and she gives permission for him to proceed with planned TEE.   TEE scheduled for 12/24/2023 at 1300 with Dr. Jenene Slicker.  Signed, Ellsworth Lennox, PA-C  12/23/2023 11:48 AM

## 2023-12-23 NOTE — Plan of Care (Signed)
  Problem: Education: Goal: Knowledge of General Education information will improve Description: Including pain rating scale, medication(s)/side effects and non-pharmacologic comfort measures Outcome: Progressing   Problem: Activity: Goal: Risk for activity intolerance will decrease Outcome: Progressing   Problem: Coping: Goal: Level of anxiety will decrease Outcome: Progressing   Problem: Pain Managment: Goal: General experience of comfort will improve and/or be controlled Outcome: Progressing   Problem: Elimination: Goal: Will not experience complications related to bowel motility Outcome: Progressing Goal: Will not experience complications related to urinary retention Outcome: Progressing   Problem: Skin Integrity: Goal: Risk for impaired skin integrity will decrease Outcome: Progressing   Problem: Safety: Goal: Ability to remain free from injury will improve Outcome: Progressing

## 2023-12-23 NOTE — Anesthesia Preprocedure Evaluation (Signed)
Anesthesia Evaluation  Patient identified by MRN, date of birth, ID band Patient awake    Reviewed: Allergy & Precautions, H&P , NPO status , Patient's Chart, lab work & pertinent test results  Airway Mallampati: II  TM Distance: >3 FB Neck ROM: Full    Dental  (+) Upper Dentures, Lower Dentures   Pulmonary neg pulmonary ROS, former smoker   Pulmonary exam normal breath sounds clear to auscultation       Cardiovascular Exercise Tolerance: Poor hypertension, + CAD  Normal cardiovascular exam+ dysrhythmias (PAF) Atrial Fibrillation  Rhythm:Regular Rate:Normal     Neuro/Psych  PSYCHIATRIC DISORDERS (Somnolent) Anxiety Depression     Neuromuscular disease    GI/Hepatic hiatal hernia,GERD  ,,(+) Cirrhosis         Endo/Other  negative endocrine ROSdiabetes    Renal/GU Renal disease  negative genitourinary   Musculoskeletal  (+) Arthritis ,    Abdominal   Peds negative pediatric ROS (+)  Hematology  (+) Blood dyscrasia (CML), anemia   Anesthesia Other Findings   Reproductive/Obstetrics negative OB ROS                             Anesthesia Physical Anesthesia Plan  ASA: 4  Anesthesia Plan: MAC   Post-op Pain Management:    Induction: Intravenous  PONV Risk Score and Plan: 0  Airway Management Planned: Simple Face Mask and Nasal Cannula  Additional Equipment:   Intra-op Plan:   Post-operative Plan:   Informed Consent: I have reviewed the patients History and Physical, chart, labs and discussed the procedure including the risks, benefits and alternatives for the proposed anesthesia with the patient or authorized representative who has indicated his/her understanding and acceptance.       Plan Discussed with: CRNA  Anesthesia Plan Comments:        Anesthesia Quick Evaluation

## 2023-12-23 NOTE — Anesthesia Postprocedure Evaluation (Signed)
Anesthesia Post Note  Patient: David Duarte  Procedure(s) Performed: AMPUTATION TOE (Right: Toe)  Patient location during evaluation: PACU Anesthesia Type: MAC Level of consciousness: awake and alert Pain management: pain level controlled Vital Signs Assessment: post-procedure vital signs reviewed and stable Respiratory status: spontaneous breathing, nonlabored ventilation and respiratory function stable Cardiovascular status: blood pressure returned to baseline and stable Postop Assessment: no apparent nausea or vomiting Anesthetic complications: no   No notable events documented.   Last Vitals:  Vitals:   12/23/23 1222 12/23/23 1234  BP: 137/81 134/83  Pulse:  (!) 103  Resp: (!) 22 20  Temp: 37.1 C (!) 36.4 C  SpO2:  97%    Last Pain:  Vitals:   12/23/23 1234  TempSrc: Oral  PainSc:                  [name]

## 2023-12-23 NOTE — Plan of Care (Signed)
Problem: Education: Goal: Knowledge of General Education information will improve Description: Including pain rating scale, medication(s)/side effects and non-pharmacologic comfort measures Outcome: Not Progressing   Problem: Health Behavior/Discharge Planning: Goal: Ability to manage health-related needs will improve Outcome: Not Progressing   Problem: Clinical Measurements: Goal: Will remain free from infection Outcome: Not Progressing

## 2023-12-23 NOTE — Progress Notes (Signed)
Awake. Mouth care done with swabs. Vaseline to lips. Tolerated well.

## 2023-12-23 NOTE — Progress Notes (Signed)
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 intake--- found to have AKI on admission with elevation of creatinine to 1.8 also had hematuria and was bladder scan P Eliquis was held--significant leukocytosis of 32 2/10 became febrile tachycardic tachypneic meeting SIRS criteria, blood culture = gram-positive cocci aerobic anaerobic, MRSA ID consulted-MRI right foot performed 2/11 MRI showed abscess right great toe  Procedures 2/10 transthoracic echo EF 55-60% mitral valve AV valve not defined well 2/12 transmetatarsal amputation right leg    Plan  Sepsis on admission 2/2 MRSA from infected right great toe status post amputation Continues vancomycin monotherapy--BC X2 on 2/11 NGTD TEE pending --will be done 2/13 at [facility] that duration of antibiotics to be discussed May require skilled care so we will ask therapy to see in the morning Myelodysplastic syndrome On luspatercept at home-last infusion 12/15/2023 CC Dr. Ellin Saba to ensure he is aware but can probably be followed as an outpatient White count is coming down to more normal range-watch thrombocytopenia Watch hemoglobin Hematuria and pyuria Urine culture showed no growth from 2/9 so we will hold further antibiotics Will continue holding DOAC for now but  probably challenge and resume in a.m. AKI on admission Mild hypokalemia Overall improved from arrival creatinine 1 8 Replacing with 3 g of IV K today-magnesium is 2.0 no further workup Lumbar degenerative disc disease Does not take any significant pain meds for this Symptomatic management PE and A-fib CHADVASC >3 on chronic Eliquis Resume blood thinner/Eliquis 5 twice daily when cleared for the same by general surgery Continues on Coreg 3.125 twice daily 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 Lasix 20 daily held for now-outpatient follow-up Probable dementia Lexapro 20 every morning resumed, resume Aricept 10 AAA iliac artery aneurysms status post repairs   DVT prophylaxis: SCD for now  Status is: Inpatient Remains inpatient appropriate because:   Requires therapy evaluation resumption of various meds etc.      Subjective: A little bit confused as is postop Wife asking for meds to "take the edge off He is wearing mittens today   Objective + exam Vitals:   12/23/23 1212 12/23/23 1215 12/23/23 1222 12/23/23 1234  BP:  (!) 142/94 137/81 134/83  Pulse: (!) 106 (!) 101  (!) 103  Resp: (!) 23 (!) 22 (!) 22 20  Temp:   98.7 F (37.1 C) (!) 97.5 F (36.4 C)  TempSrc:    Oral  SpO2: 98% 99%  97%  Weight:      Height:       Filed Weights   12/20/23 0957  Weight: 89.8 kg    Examination: EOMI NCAT no focal deficit no icterus no pallor no wheeze rales rhonchi CTAB no added sound ROM intact Chest is clear he has tattoos bilaterally upper extremities He has a reinforce bandage on his right lower extremity  Data Reviewed: reviewed   CBC  Component Value Date/Time   WBC 19.8 (H) 12/23/2023 0409   RBC 3.02 (L) 12/23/2023 0409   HGB 8.5 (L) 12/23/2023 0409   HGB 10.0 (L) 10/28/2023 1118   HCT 30.4 (L) 12/23/2023 0409   HCT 32.8 (L) 10/28/2023 1118   PLT 149 (L) 12/23/2023 0409   PLT 214 10/28/2023 1118   MCV 100.7 (H) 12/23/2023 0409   MCV 99 (H)  10/28/2023 1118   MCH 28.1 12/23/2023 0409   MCHC 28.0 (L) 12/23/2023 0409   RDW 18.6 (H) 12/23/2023 0409   RDW 17.2 (H) 10/28/2023 1118   LYMPHSABS 0.9 12/14/2023 0956   LYMPHSABS 2.1 10/28/2023 1118   MONOABS 3.8 (H) 12/14/2023 0956   EOSABS 0.0 12/14/2023 0956   EOSABS 0.1 10/28/2023 1118   BASOSABS 0.0 12/14/2023 0956   BASOSABS 0.1 10/28/2023 1118      Latest Ref Rng & Units 12/23/2023    4:09 AM 12/22/2023    5:32 AM 12/21/2023    3:24 AM  CMP  Glucose 70 - 99 mg/dL 329  518  841   BUN 8 - 23 mg/dL 27  34  39   Creatinine 0.61 - 1.24 mg/dL 6.60  6.30  1.60   Sodium 135 - 145 mmol/L 137  136  134   Potassium 3.5 - 5.1 mmol/L 3.3  3.6  2.9   Chloride 98 - 111 mmol/L 104  104  100   CO2 22 - 32 mmol/L 21  17  20    Calcium 8.9 - 10.3 mg/dL 8.4  8.4  8.3     Scheduled Meds:  acetaminophen  650 mg Oral Once   diphenhydrAMINE  25 mg Oral Once   donepezil  10 mg Oral q morning   lubiprostone  24 mcg Oral BID WC   pantoprazole  40 mg Oral Daily   Continuous Infusions:  lactated ringers 10 mL/hr at 12/23/23 1255   vancomycin 1,500 mg (12/23/23 0629)    Time  47  Rhetta Mura, MD  [facility]

## 2023-12-24 ENCOUNTER — Encounter (HOSPITAL_COMMUNITY): Payer: Self-pay | Admitting: General Surgery

## 2023-12-24 ENCOUNTER — Inpatient Hospital Stay (HOSPITAL_COMMUNITY): Payer: No Typology Code available for payment source

## 2023-12-24 ENCOUNTER — Encounter (HOSPITAL_COMMUNITY)
Admission: EM | Disposition: A | Discharge status: Hospice | Payer: Self-pay | Source: Home / Self Care | Attending: Family Medicine

## 2023-12-24 ENCOUNTER — Other Ambulatory Visit: Payer: Self-pay

## 2023-12-24 ENCOUNTER — Inpatient Hospital Stay (HOSPITAL_COMMUNITY): Payer: Medicare Other | Admitting: Certified Registered"

## 2023-12-24 ENCOUNTER — Other Ambulatory Visit: Payer: Self-pay | Admitting: Family Medicine

## 2023-12-24 DIAGNOSIS — I2510 Atherosclerotic heart disease of native coronary artery without angina pectoris: Secondary | ICD-10-CM | POA: Diagnosis not present

## 2023-12-24 DIAGNOSIS — R7881 Bacteremia: Secondary | ICD-10-CM

## 2023-12-24 DIAGNOSIS — Z87891 Personal history of nicotine dependence: Secondary | ICD-10-CM | POA: Diagnosis not present

## 2023-12-24 DIAGNOSIS — I361 Nonrheumatic tricuspid (valve) insufficiency: Secondary | ICD-10-CM

## 2023-12-24 DIAGNOSIS — N179 Acute kidney failure, unspecified: Secondary | ICD-10-CM | POA: Diagnosis not present

## 2023-12-24 DIAGNOSIS — I10 Essential (primary) hypertension: Secondary | ICD-10-CM

## 2023-12-24 HISTORY — PX: TEE WITHOUT CARDIOVERSION: SHX5443

## 2023-12-24 LAB — CBC WITH DIFFERENTIAL/PLATELET
Abs Immature Granulocytes: 0.7 10*3/uL — ABNORMAL HIGH (ref 0.00–0.07)
Band Neutrophils: 4 %
Basophils Absolute: 0 10*3/uL (ref 0.0–0.1)
Basophils Relative: 0 %
Eosinophils Absolute: 0 10*3/uL (ref 0.0–0.5)
Eosinophils Relative: 0 %
HCT: 26.3 % — ABNORMAL LOW (ref 39.0–52.0)
Hemoglobin: 7.5 g/dL — ABNORMAL LOW (ref 13.0–17.0)
Lymphocytes Relative: 6 %
Lymphs Abs: 1 10*3/uL (ref 0.7–4.0)
MCH: 27.9 pg (ref 26.0–34.0)
MCHC: 28.5 g/dL — ABNORMAL LOW (ref 30.0–36.0)
MCV: 97.8 fL (ref 80.0–100.0)
Metamyelocytes Relative: 2 %
Monocytes Absolute: 1 10*3/uL (ref 0.1–1.0)
Monocytes Relative: 6 %
Myelocytes: 2 %
Neutro Abs: 14.2 10*3/uL — ABNORMAL HIGH (ref 1.7–7.7)
Neutrophils Relative %: 80 %
Platelets: 128 10*3/uL — ABNORMAL LOW (ref 150–400)
RBC: 2.69 MIL/uL — ABNORMAL LOW (ref 4.22–5.81)
RDW: 18.7 % — ABNORMAL HIGH (ref 11.5–15.5)
WBC: 16.9 10*3/uL — ABNORMAL HIGH (ref 4.0–10.5)
nRBC: 0.2 % (ref 0.0–0.2)

## 2023-12-24 LAB — BASIC METABOLIC PANEL
Anion gap: 11 (ref 5–15)
BUN: 28 mg/dL — ABNORMAL HIGH (ref 8–23)
CO2: 19 mmol/L — ABNORMAL LOW (ref 22–32)
Calcium: 8 mg/dL — ABNORMAL LOW (ref 8.9–10.3)
Chloride: 108 mmol/L (ref 98–111)
Creatinine, Ser: 1 mg/dL (ref 0.61–1.24)
GFR, Estimated: >60 mL/min (ref >60)
Glucose, Bld: 150 mg/dL — ABNORMAL HIGH (ref 70–99)
Potassium: 3.4 mmol/L — ABNORMAL LOW (ref 3.5–5.1)
Sodium: 138 mmol/L (ref 135–145)

## 2023-12-24 LAB — VANCOMYCIN, PEAK: Vancomycin Pk: 38 ug/mL (ref 30–40)

## 2023-12-24 LAB — GLUCOSE, CAPILLARY: Glucose-Capillary: 134 mg/dL — ABNORMAL HIGH (ref 70–99)

## 2023-12-24 SURGERY — ECHOCARDIOGRAM, TRANSESOPHAGEAL
Anesthesia: General

## 2023-12-24 MED ORDER — DAPTOMYCIN-SODIUM CHLORIDE 700-0.9 MG/100ML-% IV SOLN
8.0000 mg/kg | Freq: Every day | INTRAVENOUS | Status: DC
  Administered 2023-12-24 – 2023-12-27 (×4): 700 mg via INTRAVENOUS
  Filled 2023-12-24 (×5): qty 100

## 2023-12-24 MED ORDER — CHLORHEXIDINE GLUCONATE 0.12 % MT SOLN: 15.0000 mL | Freq: Once | OROMUCOSAL | Status: DC

## 2023-12-24 MED ORDER — APIXABAN 5 MG PO TABS: 5.0000 mg | ORAL_TABLET | Freq: Two times a day (BID) | ORAL | 0 refills | Status: DC

## 2023-12-24 MED ORDER — DAPTOMYCIN-SODIUM CHLORIDE 700-0.9 MG/100ML-% IV SOLN
8.0000 mg/kg | Freq: Every day | INTRAVENOUS | Status: DC
  Filled 2023-12-24 (×2): qty 100

## 2023-12-24 MED ORDER — SODIUM CHLORIDE 0.9% FLUSH: 3.0000 mL | INTRAVENOUS | Status: DC | PRN

## 2023-12-24 MED ORDER — MUPIROCIN 2 % EX OINT
1.0000 | TOPICAL_OINTMENT | Freq: Two times a day (BID) | CUTANEOUS | Status: AC
  Administered 2023-12-24 – 2023-12-28 (×9): 1 via NASAL
  Filled 2023-12-24: qty 22

## 2023-12-24 MED ORDER — OLANZAPINE 5 MG PO TBDP
5.0000 mg | ORAL_TABLET | Freq: Every day | ORAL | Status: DC
  Administered 2023-12-24: 5 mg via ORAL
  Filled 2023-12-24: qty 1

## 2023-12-24 MED ORDER — ORAL CARE MOUTH RINSE: 15.0000 mL | Freq: Once | OROMUCOSAL | Status: DC

## 2023-12-24 MED ORDER — SODIUM CHLORIDE 0.9% FLUSH
3.0000 mL | Freq: Two times a day (BID) | INTRAVENOUS | Status: DC
  Administered 2023-12-24: 10 mL via INTRAVENOUS

## 2023-12-24 MED ORDER — BUTAMBEN-TETRACAINE-BENZOCAINE 2-2-14 % EX AERO
INHALATION_SPRAY | CUTANEOUS | Status: DC | PRN
Start: 2023-12-24 — End: 2023-12-24
  Administered 2023-12-24: 2 via TOPICAL

## 2023-12-24 MED ORDER — MEPERIDINE HCL 50 MG/ML IJ SOLN
6.2500 mg | INTRAMUSCULAR | Status: DC | PRN
Start: 2023-12-24 — End: 2023-12-24

## 2023-12-24 MED ORDER — LACTATED RINGERS IV SOLN: INTRAVENOUS | Status: DC

## 2023-12-24 MED ORDER — POTASSIUM CHLORIDE CRYS ER 20 MEQ PO TBCR
20.0000 meq | EXTENDED_RELEASE_TABLET | Freq: Once | ORAL | Status: AC
  Administered 2023-12-24: 20 meq via ORAL
  Filled 2023-12-24: qty 1

## 2023-12-24 MED ORDER — PROPOFOL 500 MG/50ML IV EMUL
INTRAVENOUS | Status: AC
  Filled 2023-12-24: qty 100

## 2023-12-24 MED ORDER — CHLORHEXIDINE GLUCONATE CLOTH 2 % EX PADS
6.0000 | MEDICATED_PAD | Freq: Every day | CUTANEOUS | Status: AC
  Administered 2023-12-24 – 2023-12-28 (×5): 6 via TOPICAL

## 2023-12-24 MED ORDER — PROPOFOL 500 MG/50ML IV EMUL
INTRAVENOUS | Status: DC | PRN
  Administered 2023-12-24: 75 ug/kg/min via INTRAVENOUS
  Administered 2023-12-24: 50 mg via INTRAVENOUS
  Administered 2023-12-24: 30 mg via INTRAVENOUS

## 2023-12-24 MED ORDER — METOCLOPRAMIDE HCL 5 MG/ML IJ SOLN: 10.0000 mg | Freq: Once | INTRAMUSCULAR | Status: DC | PRN

## 2023-12-24 MED ORDER — FENTANYL CITRATE PF 50 MCG/ML IJ SOSY: 25.0000 ug | PREFILLED_SYRINGE | INTRAMUSCULAR | Status: DC | PRN
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
## 2023-12-24 NOTE — TOC Progression Note (Signed)
Transition of Care [facility]) - Progression Note    Patient Details  Name: David Duarte MRN: [id number] Date of Birth: 1941/06/20  Transition of Care [facility]) CM/SW Contact  Elliot Gault, LCSW Phone Number: 12/24/2023, 4:12 PM  Clinical Narrative:     TOC following. Per MD, pt will need 6 weeks of IV antibiotic therapy at dc. Anticipating dc in the next 1-2 days.  Spoke with pt's wife to update. Offered SNF referral again. Pt's wife continues to decline this option. She states she has done IV anbx for herself in the past and she can manage pt at home. She would like hospital bed, hoyer lift, and BSC. CMS provider options for DME, HH, and Home Infusion and referrals provided.   Referred as requested.   TOC will follow up in AM.  Expected Discharge Plan: Home w Home Health Services Barriers to Discharge: Continued Medical Work up  Expected Discharge Plan and Services In-house Referral: Clinical Social Work   Post Acute Care Choice: Home Health Living arrangements for the past 2 months: Single Family Home                 DME Arranged: 3-N-1, Hospital bed, Other see comment Michiel Sites lift) DME Agency: [employer] Date DME Agency Contacted: 12/24/23   Representative spoke with at DME Agency: Ian Malkin             Social Determinants of Health (SDOH) Interventions SDOH Screenings   Food Insecurity: No Food Insecurity (12/20/2023)  Housing: Low Risk  (12/23/2023)  Transportation Needs: No Transportation Needs (12/20/2023)  Utilities: Not At Risk (12/20/2023)  Depression (PHQ2-9): Low Risk  (10/28/2023)  Financial Resource Strain: Low Risk  (12/06/2023)   Received from [facility]  Physical Activity: Inactive (12/06/2023)   Received from [facility]  Social Connections: Patient Unable To Answer (12/21/2023)  Stress: No Stress Concern Present (12/06/2023)   Received from [facility]  Tobacco Use: Medium Risk (12/24/2023)  Health Literacy: Low Risk  (12/06/2023)   Received  from [facility]    Readmission Risk Interventions     No data to display

## 2023-12-24 NOTE — Progress Notes (Signed)
1 Day Post-Op  Subjective: No complaints of right foot pain.  Objective: Vital signs in last 24 hours: Temp:  [97.5 F (36.4 C)-98.7 F (37.1 C)] 97.5 F (36.4 C) (02/13 0320) Pulse Rate:  [98-106] 103 (02/13 0320) Resp:  [20-23] 20 (02/13 0320) BP: (122-196)/(76-107) 161/94 (02/13 0320) SpO2:  [97 %-99 %] 97 % (02/13 0320) Last BM Date : 12/22/23  Intake/Output from previous day: 02/12 0701 - 02/13 0700 In: 584.2 [I.V.:464.2; IV Piggyback:120] Out: 1320 [Urine:1300; Blood:20] Intake/Output this shift: No intake/output data recorded.  General appearance: cooperative and no distress Extremities: Right great toe amputation site healing well.  Lab Results:  Recent Labs    12/23/23 0409 12/24/23 0449  WBC 19.8* 16.9*  HGB 8.5* 7.5*  HCT 30.4* 26.3*  PLT 149* 128*   BMET Recent Labs    12/23/23 0409 12/24/23 0449  NA 137 138  K 3.3* 3.4*  CL 104 108  CO2 21* 19*  GLUCOSE 136* 150*  BUN 27* 28*  CREATININE 0.91 1.00  CALCIUM 8.4* 8.0*   PT/INR No results for input(s): "LABPROT", "INR" in the last 72 hours.  Studies/Results: No results found.  Anti-infectives: Anti-infectives (From admission, onward)    Start     Dose/Rate Route Frequency Ordered Stop   12/23/23 1000  vancomycin (VANCOREADY) IVPB 1750 mg/350 mL  Status:  Discontinued        1,750 mg 175 mL/hr over 120 Minutes Intravenous Every 48 hours 12/21/23 0415 12/21/23 0835   12/22/23 0600  vancomycin (VANCOREADY) IVPB 1500 mg/300 mL        1,500 mg 150 mL/hr over 120 Minutes Intravenous Every 24 hours 12/21/23 0835     12/21/23 1000  cefTRIAXone (ROCEPHIN) 2 g in sodium chloride 0.9 % 100 mL IVPB  Status:  Discontinued        2 g 200 mL/hr over 30 Minutes Intravenous Every 24 hours 12/21/23 0852 12/23/23 1256   12/21/23 0515  vancomycin (VANCOREADY) IVPB 1750 mg/350 mL        1,750 mg 175 mL/hr over 120 Minutes Intravenous  Once 12/21/23 0415 12/21/23 1610       Assessment/Plan: s/p  Procedure(s): AMPUTATION TOE Impression: Stable on postoperative day 1.  Leukocytosis improved.  Wound culture shows rare gram-positive cocci. Plan: May start non weightbearing PT on right foot.  LOS: 4 days    David Duarte 12/24/2023

## 2023-12-24 NOTE — Telephone Encounter (Signed)
Copied from CRM [id number]. Topic: Clinical - Medication Refill >> Dec 24, 2023  8:38 AM Geroge Baseman wrote: Most Recent Primary Care Visit:  Provider: [facility] A  Department: [facility]  Visit Type: HOSPITAL FOLLOW UP  Date: 10/28/2023  Medication: ELIQUIS 5 MG TABS tablet  Has the patient contacted their pharmacy? Yes (Agent: If no, request that the patient contact the pharmacy for the refill. If patient does not wish to contact the pharmacy document the reason why and proceed with request.) (Agent: If yes, when and what did the pharmacy advise?)  Is this the correct pharmacy for this prescription? Yes If no, delete pharmacy and type the correct one.  This is the patient's preferred pharmacy:    [facility] - [place], [place] - [address] [facility] AT Portal to Registered [employer] Sites [address] [place] [address] Phone: [phone] Fax: [phone]   Has the prescription been filled recently? No  Is the patient out of the medication? Yes  Has the patient been seen for an appointment in the last year OR does the patient have an upcoming appointment? Yes  Can we respond through MyChart? No  Agent: Please be advised that Rx refills may take up to 3 business days. We ask that you follow-up with your pharmacy.

## 2023-12-24 NOTE — Progress Notes (Signed)
Pt is confined to one room and needs a bed side commode for home use and he is confined to one room at home.

## 2023-12-24 NOTE — Anesthesia Preprocedure Evaluation (Signed)
Anesthesia Evaluation  Patient identified by MRN, date of birth, ID band Patient awake    Reviewed: Allergy & Precautions, H&P , NPO status , Patient's Chart, lab work & pertinent test results  Airway Mallampati: II  TM Distance: >3 FB Neck ROM: Full    Dental no notable dental hx.    Pulmonary neg pulmonary ROS, former smoker   Pulmonary exam normal breath sounds clear to auscultation       Cardiovascular Exercise Tolerance: Poor hypertension, + CAD  Normal cardiovascular exam+ dysrhythmias  Rhythm:Regular Rate:Normal     Neuro/Psych  PSYCHIATRIC DISORDERS Anxiety Depression     Neuromuscular disease    GI/Hepatic Neg liver ROS, hiatal hernia,GERD  ,,  Endo/Other  diabetes    Renal/GU Renal disease  negative genitourinary   Musculoskeletal negative musculoskeletal ROS (+) Arthritis ,    Abdominal   Peds negative pediatric ROS (+)  Hematology negative hematology ROS (+) Blood dyscrasia, anemia   Anesthesia Other Findings   Reproductive/Obstetrics negative OB ROS                             Anesthesia Physical Anesthesia Plan  ASA: 3  Anesthesia Plan: General   Post-op Pain Management: Minimal or no pain anticipated   Induction: Intravenous  PONV Risk Score and Plan: 1 and Propofol infusion  Airway Management Planned: Nasal Cannula and Mask  Additional Equipment:   Intra-op Plan:   Post-operative Plan:   Informed Consent: I have reviewed the patients History and Physical, chart, labs and discussed the procedure including the risks, benefits and alternatives for the proposed anesthesia with the patient or authorized representative who has indicated his/her understanding and acceptance.       Plan Discussed with: CRNA  Anesthesia Plan Comments:        Anesthesia Quick Evaluation

## 2023-12-24 NOTE — Transfer of Care (Signed)
Immediate Anesthesia Transfer of Care Note  Patient: David Duarte  Procedure(s) Performed: TRANSESOPHAGEAL ECHOCARDIOGRAM (TEE)  Patient Location: PACU  Anesthesia Type:General  Level of Consciousness: drowsy  Airway & Oxygen Therapy: Patient Spontanous Breathing and Patient connected to nasal cannula oxygen  Post-op Assessment: Report given to RN and Post -op Vital signs reviewed and stable  Post vital signs: Reviewed and stable  Last Vitals:  Vitals Value Taken Time  BP 95/63   Temp 98.2   Pulse 109   Resp 28   SpO2 99%     Last Pain:  Vitals:   12/24/23 1042  TempSrc: Oral  PainSc: 0-No pain      Patients Stated Pain Goal: 3 (12/24/23 1042)  Complications: No notable events documented.

## 2023-12-24 NOTE — Plan of Care (Signed)

## 2023-12-24 NOTE — CV Procedure (Signed)
   TRANSESOPHAGEAL ECHOCARDIOGRAM   NAME:  David Duarte    MRN: [id number] DOB:  1941/10/07    ADMIT DATE: 12/20/2023  INDICATIONS: MRSA bacteremia  PROCEDURE:  Informed consent was obtained prior to the procedure. After a procedural time-out, the oropharynx was anesthetized and the patient was sedated by the anesthesia service. The transesophageal probe was inserted in the esophagus and stomach without difficulty and multiple views were obtained.   FINDINGS No evidence of any echodensity consistent with vegetation on aortic, mitral, tricuspid and pulmonic valves.   [name], MD, [facility]  [facility]  12:22 PM

## 2023-12-24 NOTE — Anesthesia Postprocedure Evaluation (Signed)
Anesthesia Post Note  Patient: David Duarte  Procedure(s) Performed: TRANSESOPHAGEAL ECHOCARDIOGRAM (TEE)  Patient location during evaluation: PACU Anesthesia Type: General Level of consciousness: awake and alert Pain management: pain level controlled Vital Signs Assessment: post-procedure vital signs reviewed and stable Respiratory status: spontaneous breathing, nonlabored ventilation and respiratory function stable Cardiovascular status: blood pressure returned to baseline and stable Postop Assessment: no apparent nausea or vomiting Anesthetic complications: no   No notable events documented.   Last Vitals:  Vitals:   12/24/23 1259 12/24/23 1300  BP: (!) 141/85 (!) 141/85  Pulse: 98 100  Resp: (!) 28 (!) 24  Temp:    SpO2: 96% 95%    Last Pain:  Vitals:   12/24/23 1230  TempSrc:   PainSc: 3                  [name]

## 2023-12-24 NOTE — Progress Notes (Signed)
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

## 2023-12-24 NOTE — Progress Notes (Signed)
PHARMACY CONSULT NOTE FOR:  OUTPATIENT  PARENTERAL ANTIBIOTIC THERAPY (OPAT)  Indication: MRSA bacteremia/Osteo Regimen: Daptomycin 700 mg IV Q 24 hours End date: 02/03/24  IV antibiotic discharge orders are pended. To discharging provider:  please sign these orders via discharge navigator,  Select New Orders & click on the button choice - Manage This Unsigned Work.     Thank you for allowing pharmacy to be a part of this patient's care.  Sharin Mons, PharmD, BCPS, BCIDP Infectious Diseases Clinical Pharmacist Phone: [phone] 12/24/2023, 3:40 PM

## 2023-12-24 NOTE — Progress Notes (Addendum)
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 Ellin Saba to ensure he is aware-- followed as an outpatient White count is coming down to more normal range-watch thrombocytopenia feel that this is mainly dilutional versus blood loss related Anemia of acute blood loss superimposed on MDS as above Hemoglobin has trended down slightly postprocedure baseline is  variable but usually above 8 Trend and transfusion threshold would be below 7 Hematuria and pyuria Urine culture showed no growth from 2/9 so we will hold further antibiotics Will continue holding DOAC until all procedures are finished and then can resume the same probably on 2/14 AKI on admission Mild hypokalemia Overall improved from arrival creatinine 1 8 Replaced with IV as well as p.o.-stop replacement and recheck periodically Lumbar degenerative disc disease Does not take any significant pain meds for this Symptomatic management PE and A-fib CHADVASC >3 on chronic Eliquis Resume blood thinner/Eliquis 5 twice daily when all seizures including TEE have been performed Continues on Coreg 3.125 twice daily 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 Lasix 20 daily held for now-as an outpatient would probably go to every other day May require addition of Aldactone to be determined as an outpatient Probable dementia Lexapro 20 every morning resumed, resume Aricept 10 AAA iliac artery aneurysms status post repairs   DVT prophylaxis: SCD for now  Status is: Inpatient Remains inpatient appropriate because:   Requires completion of TEE and therapy eval   Subjective:  Less confused can tell me date time year looks awake alert does not seem to be in distress no pain no fever no chills He is thirsty and asking for juice but does not seem to understand that he is n.p.o. for procedure He gets his days confused a little bit tells me he went for surgery this morning    Objective + exam Vitals:   12/23/23 1222 12/23/23 1234 12/23/23 1928 12/24/23 0320  BP: 137/81 134/83 135/76 (!) 161/94  Pulse:  (!) 103 (!) 106 (!) 103  Resp: (!) 22 20  20   Temp: 98.7 F (37.1 C) (!) 97.5  F (36.4 C) 97.7 F (36.5 C) (!) 97.5 F (36.4 C)  TempSrc:  Oral Oral Oral  SpO2:  97% 97% 97%  Weight:      Height:       Filed Weights   12/20/23 0957  Weight: 89.8 kg    Examination:  EOMI  NCAT more coherent but still slight confusion Chest is clear slightly tachycardic  S1-S2 no murmur ROM is intact Abdomen is soft no rebound no guarding No lower extremity edema right great toe has a reinforced bandage  Data Reviewed: reviewed   CBC    Component Value Date/Time   WBC 16.9 (H) 12/24/2023 0449   RBC 2.69 (L) 12/24/2023 0449   HGB 7.5 (L) 12/24/2023 0449   HGB 10.0 (L) 10/28/2023 1118   HCT 26.3 (L) 12/24/2023 0449   HCT 32.8 (L) 10/28/2023 1118   PLT 128 (L) 12/24/2023 0449   PLT 214 10/28/2023 1118   MCV 97.8 12/24/2023 0449   MCV 99 (H) 10/28/2023 1118   MCH 27.9 12/24/2023 0449   MCHC 28.5 (L) 12/24/2023 0449   RDW 18.7 (H) 12/24/2023 0449   RDW 17.2 (H) 10/28/2023 1118   LYMPHSABS 1.0 12/24/2023 0449   LYMPHSABS 2.1 10/28/2023 1118   MONOABS 1.0 12/24/2023 0449   EOSABS 0.0 12/24/2023 0449   EOSABS 0.1 10/28/2023 1118   BASOSABS 0.0 12/24/2023 0449   BASOSABS 0.1 10/28/2023 1118      Latest Ref Rng & Units 12/24/2023    4:49 AM 12/23/2023    4:09 AM 12/22/2023    5:32 AM  CMP  Glucose 70 - 99 mg/dL 604  540  981   BUN 8 - 23 mg/dL 28  27  34   Creatinine 0.61 - 1.24 mg/dL 1.91  4.78  2.95   Sodium 135 - 145 mmol/L 138  137  136   Potassium 3.5 - 5.1 mmol/L 3.4  3.3  3.6   Chloride 98 - 111 mmol/L 108  104  104   CO2 22 - 32 mmol/L 19  21  17    Calcium 8.9 - 10.3 mg/dL 8.0  8.4  8.4     Scheduled Meds:  acetaminophen  650 mg Oral Once   diphenhydrAMINE  25 mg Oral Once   donepezil  10 mg Oral q morning   escitalopram  20 mg Oral q morning   lubiprostone  24 mcg Oral BID WC   pantoprazole  40 mg Oral Daily   potassium chloride  20 mEq Oral Once   Continuous Infusions:  lactated ringers 10 mL/hr at 12/23/23 1255   vancomycin 1,500 mg (12/24/23 0600)    Time  47  Rhetta Mura, MD  [facility]

## 2023-12-24 NOTE — Plan of Care (Signed)
Problem: Education: Goal: Knowledge of General Education information will improve Description: Including pain rating scale, medication(s)/side effects and non-pharmacologic comfort measures Outcome: Progressing   Problem: Safety: Goal: Ability to remain free from injury will improve Outcome: Progressing

## 2023-12-25 ENCOUNTER — Ambulatory Visit: Payer: Medicare Other | Admitting: Family Medicine

## 2023-12-25 ENCOUNTER — Inpatient Hospital Stay (HOSPITAL_COMMUNITY): Payer: Medicare Other

## 2023-12-25 ENCOUNTER — Encounter (HOSPITAL_COMMUNITY): Payer: Self-pay | Admitting: Internal Medicine

## 2023-12-25 DIAGNOSIS — N179 Acute kidney failure, unspecified: Secondary | ICD-10-CM | POA: Diagnosis not present

## 2023-12-25 LAB — CBC WITH DIFFERENTIAL/PLATELET
Abs Immature Granulocytes: 1.7 10*3/uL — ABNORMAL HIGH (ref 0.00–0.07)
Band Neutrophils: 3 %
Basophils Absolute: 0 10*3/uL (ref 0.0–0.1)
Basophils Relative: 0 %
Eosinophils Absolute: 0 10*3/uL (ref 0.0–0.5)
Eosinophils Relative: 0 %
HCT: 27.2 % — ABNORMAL LOW (ref 39.0–52.0)
Hemoglobin: 7.7 g/dL — ABNORMAL LOW (ref 13.0–17.0)
Lymphocytes Relative: 7 %
Lymphs Abs: 1 10*3/uL (ref 0.7–4.0)
MCH: 28.2 pg (ref 26.0–34.0)
MCHC: 28.3 g/dL — ABNORMAL LOW (ref 30.0–36.0)
MCV: 99.6 fL (ref 80.0–100.0)
Metamyelocytes Relative: 4 %
Monocytes Absolute: 1.1 10*3/uL — ABNORMAL HIGH (ref 0.1–1.0)
Monocytes Relative: 8 %
Myelocytes: 8 %
Neutro Abs: 10.3 10*3/uL — ABNORMAL HIGH (ref 1.7–7.7)
Neutrophils Relative %: 70 %
Platelets: 134 10*3/uL — ABNORMAL LOW (ref 150–400)
RBC: 2.73 MIL/uL — ABNORMAL LOW (ref 4.22–5.81)
RDW: 18.9 % — ABNORMAL HIGH (ref 11.5–15.5)
Smear Review: DECREASED
WBC: 14.1 10*3/uL — ABNORMAL HIGH (ref 4.0–10.5)
nRBC: 0.4 % — ABNORMAL HIGH (ref 0.0–0.2)

## 2023-12-25 LAB — BASIC METABOLIC PANEL
Anion gap: 12 (ref 5–15)
BUN: 29 mg/dL — ABNORMAL HIGH (ref 8–23)
CO2: 20 mmol/L — ABNORMAL LOW (ref 22–32)
Calcium: 8.2 mg/dL — ABNORMAL LOW (ref 8.9–10.3)
Chloride: 111 mmol/L (ref 98–111)
Creatinine, Ser: 1.04 mg/dL (ref 0.61–1.24)
GFR, Estimated: >60 mL/min (ref >60)
Glucose, Bld: 147 mg/dL — ABNORMAL HIGH (ref 70–99)
Potassium: 3.4 mmol/L — ABNORMAL LOW (ref 3.5–5.1)
Sodium: 143 mmol/L (ref 135–145)

## 2023-12-25 LAB — SURGICAL PATHOLOGY

## 2023-12-25 LAB — CK: Total CK: 58 U/L (ref 49–397)

## 2023-12-25 MED ORDER — DAPTOMYCIN IV (FOR PTA / DISCHARGE USE ONLY)
700.0000 mg | INTRAVENOUS | 0 refills | Status: DC
Start: 2023-12-25 — End: 2024-02-04

## 2023-12-25 MED ORDER — LORAZEPAM 2 MG/ML IJ SOLN
INTRAMUSCULAR | Status: AC
Start: 2023-12-25 — End: 2023-12-25
  Administered 2023-12-25: 2 mg via INTRAVENOUS
  Filled 2023-12-25: qty 1

## 2023-12-25 MED ORDER — SODIUM CHLORIDE 0.9% FLUSH
10.0000 mL | Freq: Two times a day (BID) | INTRAVENOUS | Status: DC
  Administered 2023-12-25 – 2023-12-29 (×8): 10 mL

## 2023-12-25 MED ORDER — FERROUS SULFATE 325 (65 FE) MG PO TABS
325.0000 mg | ORAL_TABLET | Freq: Every day | ORAL | Status: DC
  Filled 2023-12-25: qty 1

## 2023-12-25 MED ORDER — SODIUM CHLORIDE 0.9% FLUSH: 10.0000 mL | INTRAVENOUS | Status: DC | PRN

## 2023-12-25 MED ORDER — LORAZEPAM 2 MG/ML IJ SOLN: 1.0000 mg | Freq: Once | INTRAMUSCULAR | Status: AC

## 2023-12-25 NOTE — Progress Notes (Signed)
Peripherally Inserted Central Catheter Placement  The IV Nurse has discussed with the patient and/or persons authorized to consent for the patient, the purpose of this procedure and the potential benefits and risks involved with this procedure.  The benefits include less needle sticks, lab draws from the catheter, and the patient may be discharged home with the catheter. Risks include, but not limited to, infection, bleeding, blood clot (thrombus formation), and puncture of an artery; nerve damage and irregular heartbeat and possibility to perform a PICC exchange if needed/ordered by physician.  Alternatives to this procedure were also discussed.  Bard Power PICC patient education guide, fact sheet on infection prevention and patient information card has been provided to patient /or left at bedside.  Wife at bedside signed consent due to patient confused.   PICC Placement Documentation  PICC Single Lumen 12/25/23 Right Basilic 38 cm 0 cm (Active)  Indication for Insertion or Continuance of Line Prolonged intravenous therapies;Home intravenous therapies (PICC only) 12/25/23 1441  Exposed Catheter (cm) 0 cm 12/25/23 1441  Site Assessment Clean, Dry, Intact 12/25/23 1441  Line Status Flushed;Saline locked;Blood return noted 12/25/23 1441  Dressing Type Transparent 12/25/23 1441  Dressing Status Antimicrobial disc/dressing in place 12/25/23 1441  Line Care Cap changed;Connections checked and tightened 12/25/23 1441  Dressing Change Due 01/01/24 12/25/23 1712       Andrey Cota 12/25/2023, 5:13 PM

## 2023-12-25 NOTE — Care Management Important Message (Signed)
Important Message  Patient Details  Name: David Duarte MRN: [id number] Date of Birth: 12-24-1940   Important Message Given:  Yes - Medicare IM     Corey Harold 12/25/2023, 12:03 PM

## 2023-12-25 NOTE — Progress Notes (Signed)
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 Lovell Sheehan Bone Cult + for MRSA TEE Echo shows no Veg PICC placed 2/14 [3 days out from neg [facility] on 2/11--Daptomycin monotherapy EOT 02/03/24 and then remove PICC Myelodysplastic syndrome On luspatercept at home-last infusion 12/15/2023 CC Dr. Ellin Saba to ensure he is aware-- followed as an outpatient White count is coming down , stop routine labs, next check as OP Anemia of acute blood loss superimposed on MDS as above Hemoglobin stable in mid-7 range Get iron studeis as OP 1 week-2 week at Oncology office.  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 Discharging to home with [facility] and long term Abx to complete as OP   Subjective:  Some confusion Pulled IV out yesterday.  Requiring intemittent mittens No cp fever n/v sob Remains some confused   Objective + exam Vitals:   12/24/23 1259 12/24/23 1300 12/24/23 1954 12/25/23 0523  BP: (!) 141/85 (!) 141/85 131/76 (!) 150/90  Pulse: 98 100 95 92  Resp: (!) 28 (!) 24 18   Temp:   98.7 F (37.1 C) 98.6 F (37 C)  TempSrc:   Axillary Axillary  SpO2: 96% 95% 98% 94%  Weight:      Height:       Filed Weights   12/20/23 0957  Weight: 89.8 kg    Examination:  EOMI NCAT ,  confused--in soft mitten Chest is clear slightly tachycardic  S1-S2 no murmur ROM is intact Abdomen is soft no rebound no guarding right great toe has a reinforced bandage--wound not examined  Data Reviewed: reviewed   CBC    Component Value Date/Time   WBC 14.1 (H) 12/25/2023 0545   RBC 2.73 (L) 12/25/2023  0545   HGB 7.7 (L) 12/25/2023 0545   HGB 10.0 (L) 10/28/2023 1118   HCT 27.2 (L) 12/25/2023 0545   HCT 32.8 (L) 10/28/2023 1118   PLT 134 (L) 12/25/2023 0545   PLT 214 10/28/2023 1118   MCV 99.6 12/25/2023 0545   MCV 99 (H) 10/28/2023 1118   MCH 28.2 12/25/2023 0545   MCHC 28.3 (L) 12/25/2023 0545   RDW 18.9 (H) 12/25/2023 0545   RDW 17.2 (H) 10/28/2023 1118   LYMPHSABS 1.0 12/25/2023 0545   LYMPHSABS 2.1 10/28/2023 1118   MONOABS 1.1 (H) 12/25/2023 0545   EOSABS 0.0 12/25/2023 0545   EOSABS 0.1 10/28/2023 1118   BASOSABS 0.0 12/25/2023 0545   BASOSABS 0.1 10/28/2023 1118      Latest Ref Rng & Units 12/25/2023    5:45 AM 12/24/2023    4:49 AM 12/23/2023    4:09 AM  CMP  Glucose 70 - 99 mg/dL 161  096  045   BUN 8 - 23 mg/dL 29  28  27    Creatinine 0.61 - 1.24 mg/dL 4.09  8.11  9.14   Sodium 135 - 145 mmol/L 143  138  137   Potassium 3.5 - 5.1 mmol/L 3.4  3.4  3.3   Chloride 98 - 111 mmol/L 111  108  104   CO2 22 - 32 mmol/L 20  19  21    Calcium 8.9 - 10.3 mg/dL 8.2  8.0  8.4     Scheduled Meds:  acetaminophen  650 mg Oral Once   Chlorhexidine Gluconate Cloth  6 each Topical Daily   donepezil  10 mg Oral q morning   escitalopram  20 mg Oral q morning   [START ON 12/26/2023] ferrous sulfate  325 mg Oral Q breakfast   lubiprostone  24 mcg Oral BID WC   mupirocin ointment  1 Application Nasal BID   OLANZapine zydis  5 mg Oral q1800   pantoprazole  40 mg Oral Daily   Continuous Infusions:  DAPTOmycin 700 mg (12/24/23 1753)    Time  17  Rhetta Mura, MD  [facility]

## 2023-12-25 NOTE — Progress Notes (Signed)
Brief ID note -TTE negative -Follow blood Cx from 2/11->place picc when Ngx3d -Plan on 6 weeks of IV abx form OR

## 2023-12-25 NOTE — Progress Notes (Addendum)
Brief ID note 83 year old male with history of CML, A-fib, PE, cirrhosis, diabetes mellitus admitted with #MRSA bacteremia 2/2 right great  toe infection - On admission patient had temp of 102, WBC 32..  Blood cultures 2/2 sets from admission grew MRSA. 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  Multilevel fluid collection suspicion for abscesses in the distal dorsal and dorsal lateral aspect of great toe.  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 -TTE and TEE without vegetation Plan: - Plan on 6 weeks of antibiotics from the OR - Place PICC cultures no growth x 3 days from 2/11 - Plan on 6 weeks antibiotics from the OR  Follow Dapto sensitivities.  Follow-up with infectious disease, Marcos Eke 2/26 - ID will sign off.  OPAT ORDERS:  Diagnosis: MRSA bacteremia 2/2 right toe osteo  No Known Allergies   Discharge antibiotics to be given via PICC line:  Per pharmacy protocol Daptomycin 700 mg IV Q 24 hours    Duration: 6 weeks End Date: 3/26  [facility] Per Protocol with [employer] Use: Home health RN for IV administration and teaching, line care and labs.    Labs weekly while on IV antibiotics: _x_ CBC with differential __ BMP **TWICE WEEKLY ON VANCOMYCIN  _x_ CMP _x_ CRP _x_ ESR __ Vancomycin trough TWICE WEEKLY _x_ CK  __ Please pull PIC at completion of IV antibiotics x__ Please leave PIC in place until doctor has seen patient or been notified  Fax weekly labs to [phone]  Clinic Follow Up Appt: 2/26  @ [facility] with Marcos Eke

## 2023-12-25 NOTE — Progress Notes (Signed)
1 Day Post-Op  Subjective: Patient resting comfortably.  Objective: Vital signs in last 24 hours: Temp:  [97.6 F (36.4 C)-98.7 F (37.1 C)] 98.6 F (37 C) (02/14 0523) Pulse Rate:  [92-105] 92 (02/14 0523) Resp:  [18-30] 18 (02/13 1954) BP: (91-157)/(59-90) 150/90 (02/14 0523) SpO2:  [94 %-100 %] 94 % (02/14 0523) Last BM Date : 12/24/23  Intake/Output from previous day: 02/13 0701 - 02/14 0700 In: 353.1 [I.V.:260; IV Piggyback:93.1] Out: 700 [Urine:700] Intake/Output this shift: No intake/output data recorded.  General appearance: cooperative and no distress Extremities: Right great toe amputation site healing well.  Xeroform still in place.  Lab Results:  Recent Labs    12/24/23 0449 12/25/23 0545  WBC 16.9* 14.1*  HGB 7.5* 7.7*  HCT 26.3* 27.2*  PLT 128* 134*   BMET Recent Labs    12/24/23 0449 12/25/23 0545  NA 138 143  K 3.4* 3.4*  CL 108 111  CO2 19* 20*  GLUCOSE 150* 147*  BUN 28* 29*  CREATININE 1.00 1.04  CALCIUM 8.0* 8.2*   PT/INR No results for input(s): "LABPROT", "INR" in the last 72 hours.  Studies/Results: [place] EKG SITE RITE Result Date: 12/24/2023 If Site Rite image not attached, placement could not be confirmed due to current cardiac rhythm.   Anti-infectives: Anti-infectives (From admission, onward)    Start     Dose/Rate Route Frequency Ordered Stop   12/24/23 1500  DAPTOmycin (CUBICIN) IVPB 700 mg/153mL premix        8 mg/kg  89.8 kg 200 mL/hr over 30 Minutes Intravenous Daily 12/24/23 1043     12/24/23 1130  DAPTOmycin (CUBICIN) IVPB 700 mg/189mL premix  Status:  Discontinued        8 mg/kg  89.8 kg 200 mL/hr over 30 Minutes Intravenous Daily 12/24/23 1040 12/24/23 1043   12/23/23 1000  vancomycin (VANCOREADY) IVPB 1750 mg/350 mL  Status:  Discontinued        1,750 mg 175 mL/hr over 120 Minutes Intravenous Every 48 hours 12/21/23 0415 12/21/23 0835   12/22/23 0600  vancomycin (VANCOREADY) IVPB 1500 mg/300 mL  Status:   Discontinued        1,500 mg 150 mL/hr over 120 Minutes Intravenous Every 24 hours 12/21/23 0835 12/24/23 1040   12/21/23 1000  cefTRIAXone (ROCEPHIN) 2 g in sodium chloride 0.9 % 100 mL IVPB  Status:  Discontinued        2 g 200 mL/hr over 30 Minutes Intravenous Every 24 hours 12/21/23 0852 12/23/23 1256   12/21/23 0515  vancomycin (VANCOREADY) IVPB 1750 mg/350 mL        1,750 mg 175 mL/hr over 120 Minutes Intravenous  Once 12/21/23 0415 12/21/23 1610       Assessment/Plan: Impression: Patient healing well on postoperative day 2.  Intraoperative cultures positive for Staph aureus.  Leukocytosis slowly resolving.  Will remove Xeroform prior to discharge.  PT as tolerated.  LOS: 5 days    Franky Macho 12/25/2023

## 2023-12-25 NOTE — Progress Notes (Signed)
Dr Mahala Menghini aware needing medicine to calm pt for PICC placement. Pt restless.

## 2023-12-25 NOTE — TOC Progression Note (Addendum)
Transition of Care [facility]) - Progression Note    Patient Details  Name: David Duarte MRN: [id number] Date of Birth: 1941/08/14  [facility]) CM/SW Contact  Karn Cassis, [place] Phone Number: 12/25/2023, 12:58 PM  Clinical Narrative:  Kandee Keen with [facility] accepts for HHPT/RN. LCSW spoke with Pam with [employer] who reports she will be doing teaching with pt's wife this afternoon. [facility] and [employer] aware of plan to d/c tomorrow after IV antibiotics administered. Home health to start Sunday. Per Pam, OPAT orders need to be signed. MD notified. Wife will follow up with [employer] regarding DME delivery.    Addendum: Pt's wife requesting rollator. LCSW checked with [employer] and cost is $120 out of pocket. Pt's wife will get on own through [place] or [address].   Expected Discharge Plan: Home w Home Health Services Barriers to Discharge: Continued Medical Work up  Expected Discharge Plan and Services In-house Referral: Clinical Social Work   Post Acute Care Choice: Home Health Living arrangements for the past 2 months: Single Family Home                 DME Arranged: 3-N-1, Hospital bed, Other see comment Michiel Sites lift) DME Agency: [employer] Date DME Agency Contacted: 12/24/23   Representative spoke with at DME Agency: Ian Malkin             Social Determinants of Health (SDOH) Interventions SDOH Screenings   Food Insecurity: No Food Insecurity (12/20/2023)  Housing: Low Risk  (12/23/2023)  Transportation Needs: No Transportation Needs (12/20/2023)  Utilities: Not At Risk (12/20/2023)  Depression (PHQ2-9): Low Risk  (10/28/2023)  Financial Resource Strain: Low Risk  (12/06/2023)   Received from [facility]  Physical Activity: Inactive (12/06/2023)   Received from [facility]  Social Connections: Patient Unable To Answer (12/21/2023)  Stress: No Stress Concern Present (12/06/2023)   Received from [facility]  Tobacco Use: Medium Risk (12/24/2023)  Health Literacy: Low  Risk  (12/06/2023)   Received from [facility]    Readmission Risk Interventions     No data to display

## 2023-12-25 NOTE — Progress Notes (Signed)
Physical Therapy Treatment Patient Details Name: David Duarte MRN: [id number] DOB: November 24, 1940 Today's Date: 12/25/2023   History of Present Illness David Duarte is a 83 y.o. male, s/p Right great toe amputation on 12/22/22  with medical history significant for chronic myeloid leukemia, atrial fibrillation, pulmonary embolism, hypertension, diabetes mellitus, coronary artery disease, cirrhosis.  Patient presented to the ED via EMS with complaints of abdominal pain.  Reports chronic abdominal pain over the past week.  He reports pain is lower abdomen, he also reports some pain when he tried to urinate.  He reports reduced oral intake with generalized weakness, unable to ambulate, and spouse not been able to take care of patient in his current condition.  No vomiting no diarrhea.  No fevers no chills no difficulty breathing.  Reports his last meal was over a week ago.    PT Comments  REASSESSMENT: Patient demonstrates slow labored movement for sitting up at bedside, once seated demonstrates improvement for maintaining sitting balance, required active assistance and repeated verbal/tactile cueing for completing BLE exercises and limited to one shuffling side step before having to sit due to BLE weakness and poor standing balance. Patient put back to bed due to HR increasing to 140's with exertion.  Patient will benefit from continued skilled physical therapy in hospital and recommended venue below to increase strength, balance, endurance for safe ADLs and gait.      If plan is discharge home, recommend the following: A lot of help with bathing/dressing/bathroom;A lot of help with walking and/or transfers;Help with stairs or ramp for entrance;Assistance with cooking/housework   Can travel by private vehicle     No  Equipment Recommendations  Printmaker for Other Services       Precautions / Restrictions Precautions Precautions: Fall Restrictions Weight Bearing  Restrictions Per Provider Order: No     Mobility  Bed Mobility Overal bed mobility: Needs Assistance Bed Mobility: Supine to Sit, Sit to Supine     Supine to sit: Mod assist, Max assist Sit to supine: Min assist        Transfers Overall transfer level: Needs assistance Equipment used: Rolling walker (2 wheels) Transfers: Sit to/from Stand Sit to Stand: Max assist           General transfer comment: unsteady labored movement with buckling of knees    Ambulation/Gait Ambulation/Gait assistance: Max Chemical engineer (Feet): 1 Feet Assistive device: Rolling walker (2 wheels) Gait Pattern/deviations: Decreased step length - right, Decreased step length - left, Decreased stride length, Knees buckling, Trunk flexed, Shuffle Gait velocity: slow     General Gait Details: unable to fully extend trunk due to weakness, poor standing balance wtih buckling of knees and limited to 1 shuffling side steps   Stairs             Wheelchair Mobility     Tilt Bed    Modified Rankin (Stroke Patients Only)       Balance Overall balance assessment: Needs assistance Sitting-balance support: No upper extremity supported, Feet supported Sitting balance-Leahy Scale: Fair Sitting balance - Comments: seated at EOB   Standing balance support: Reliant on assistive device for balance, During functional activity, Bilateral upper extremity supported Standing balance-Leahy Scale: Poor Standing balance comment: use RW                            Communication Communication Communication: No apparent difficulties  Cognition Arousal: Alert  Behavior During Therapy: Flat affect   PT - Cognitive impairments: Safety/Judgement                       PT - Cognition Comments: Patient appears confused Following commands: Impaired Following commands impaired: Follows multi-step commands inconsistently    Cueing Cueing Techniques: Verbal cues, Tactile cues   Exercises General Exercises - Lower Extremity Ankle Circles/Pumps: Seated, AROM, Strengthening, Both, 10 reps Long Arc Quad: Seated, AROM, AAROM, Strengthening, Both, 10 reps Hip Flexion/Marching: Seated, AROM, AAROM, Strengthening, Both, 10 reps    General Comments        Pertinent Vitals/Pain Pain Assessment Pain Assessment: Faces Faces Pain Scale: Hurts a little bit Pain Location: bilateral feet Pain Descriptors / Indicators: Discomfort, Grimacing, Sore Pain Intervention(s): Limited activity within patient's tolerance, Monitored during session, Repositioned    Home Living                          Prior Function            PT Goals (current goals can now be found in the care plan section) Acute Rehab PT Goals Patient Stated Goal: to return home PT Goal Formulation: With patient Time For Goal Achievement: 01/04/24 Potential to Achieve Goals: Good Progress towards PT goals: Progressing toward goals    Frequency    Min 3X/week      PT Plan      Co-evaluation              AM-PAC PT "6 Clicks" Mobility   Outcome Measure    Help needed moving from lying on your back to sitting on the side of a flat bed without using bedrails?: A Lot Help needed moving to and from a bed to a chair (including a wheelchair)?: A Lot Help needed standing up from a chair using your arms (e.g., wheelchair or bedside chair)?: A Lot Help needed to walk in hospital room?: Total Help needed climbing 3-5 steps with a railing? : Total 6 Click Score: 8    End of Session   Activity Tolerance: Patient tolerated treatment well;Patient limited by fatigue Patient left: in bed;with call bell/phone within reach;with bed alarm set Nurse Communication: Mobility status PT Visit Diagnosis: Unsteadiness on feet (R26.81);Other abnormalities of gait and mobility (R26.89);Muscle weakness (generalized) (M62.81)     Time: 4098-1191 PT Time Calculation (min) (ACUTE ONLY): 25  min  Charges:    $Therapeutic Exercise: 8-22 mins $Therapeutic Activity: 8-22 mins PT General Charges $$ ACUTE PT VISIT: 1 Visit                     12:32 PM, 12/25/23 Ocie Bob, MPT Physical Therapist with [facility] [phone] office [phone] mobile phone

## 2023-12-25 NOTE — Plan of Care (Signed)
Problem: Education: Goal: Knowledge of General Education information will improve Description: Including pain rating scale, medication(s)/side effects and non-pharmacologic comfort measures Outcome: Not Progressing   Problem: Activity: Goal: Risk for activity intolerance will decrease Outcome: Not Progressing   Problem: Nutrition: Goal: Adequate nutrition will be maintained Outcome: Not Progressing

## 2023-12-26 ENCOUNTER — Inpatient Hospital Stay (HOSPITAL_COMMUNITY): Payer: Medicare Other

## 2023-12-26 DIAGNOSIS — N179 Acute kidney failure, unspecified: Secondary | ICD-10-CM | POA: Diagnosis not present

## 2023-12-26 LAB — BASIC METABOLIC PANEL
Anion gap: 13 (ref 5–15)
BUN: 36 mg/dL — ABNORMAL HIGH (ref 8–23)
CO2: 19 mmol/L — ABNORMAL LOW (ref 22–32)
Calcium: 7.9 mg/dL — ABNORMAL LOW (ref 8.9–10.3)
Chloride: 115 mmol/L — ABNORMAL HIGH (ref 98–111)
Creatinine, Ser: 1.2 mg/dL (ref 0.61–1.24)
GFR, Estimated: >60 mL/min (ref >60)
Glucose, Bld: 143 mg/dL — ABNORMAL HIGH (ref 70–99)
Potassium: 3.5 mmol/L (ref 3.5–5.1)
Sodium: 147 mmol/L — ABNORMAL HIGH (ref 135–145)

## 2023-12-26 LAB — CBC WITH DIFFERENTIAL/PLATELET
Abs Immature Granulocytes: 0.6 10*3/uL — ABNORMAL HIGH (ref 0.00–0.07)
Basophils Absolute: 0 10*3/uL (ref 0.0–0.1)
Basophils Relative: 0 %
Eosinophils Absolute: 0 10*3/uL (ref 0.0–0.5)
Eosinophils Relative: 0 %
HCT: 25.9 % — ABNORMAL LOW (ref 39.0–52.0)
Hemoglobin: 7.2 g/dL — ABNORMAL LOW (ref 13.0–17.0)
Lymphocytes Relative: 19 %
Lymphs Abs: 1.9 10*3/uL (ref 0.7–4.0)
MCH: 28.2 pg (ref 26.0–34.0)
MCHC: 27.8 g/dL — ABNORMAL LOW (ref 30.0–36.0)
MCV: 101.6 fL — ABNORMAL HIGH (ref 80.0–100.0)
Metamyelocytes Relative: 2 %
Monocytes Absolute: 1.4 10*3/uL — ABNORMAL HIGH (ref 0.1–1.0)
Monocytes Relative: 14 %
Myelocytes: 4 %
Neutro Abs: 6.1 10*3/uL (ref 1.7–7.7)
Neutrophils Relative %: 61 %
Platelets: 128 10*3/uL — ABNORMAL LOW (ref 150–400)
RBC: 2.55 MIL/uL — ABNORMAL LOW (ref 4.22–5.81)
RDW: 19.5 % — ABNORMAL HIGH (ref 11.5–15.5)
WBC: 10 10*3/uL (ref 4.0–10.5)
nRBC: 1 /100{WBCs} — ABNORMAL HIGH
nRBC: 1.1 % — ABNORMAL HIGH (ref 0.0–0.2)

## 2023-12-26 LAB — RETICULOCYTES
Immature Retic Fract: 43.1 % — ABNORMAL HIGH (ref 2.3–15.9)
RBC.: 2.52 MIL/uL — ABNORMAL LOW (ref 4.22–5.81)
Retic Count, Absolute: 46.1 10*3/uL (ref 19.0–186.0)
Retic Ct Pct: 1.8 % (ref 0.4–3.1)

## 2023-12-26 LAB — IRON AND TIBC
Iron: 71 ug/dL (ref 45–182)
Saturation Ratios: 36 % (ref 17.9–39.5)
TIBC: 198 ug/dL — ABNORMAL LOW (ref 250–450)
UIBC: 127 ug/dL

## 2023-12-26 LAB — FOLATE: Folate: 5.7 ng/mL — ABNORMAL LOW (ref >5.9)

## 2023-12-26 LAB — FERRITIN: Ferritin: 1169 ng/mL — ABNORMAL HIGH (ref 24–336)

## 2023-12-26 LAB — VITAMIN B12: Vitamin B-12: 1341 pg/mL — ABNORMAL HIGH (ref 180–914)

## 2023-12-26 MED ORDER — ACETAMINOPHEN 325 MG PO TABS
650.0000 mg | ORAL_TABLET | Freq: Once | ORAL | Status: DC
  Filled 2023-12-26: qty 2

## 2023-12-26 MED ORDER — FUROSEMIDE 10 MG/ML IJ SOLN
20.0000 mg | Freq: Once | INTRAMUSCULAR | Status: AC
  Administered 2023-12-27: 20 mg via INTRAVENOUS
  Filled 2023-12-26: qty 2

## 2023-12-26 MED ORDER — APIXABAN 2.5 MG PO TABS: 2.5000 mg | ORAL_TABLET | Freq: Two times a day (BID) | ORAL | Status: DC

## 2023-12-26 MED ORDER — DIPHENHYDRAMINE HCL 25 MG PO CAPS
25.0000 mg | ORAL_CAPSULE | Freq: Once | ORAL | Status: DC
  Filled 2023-12-26: qty 1

## 2023-12-26 MED ORDER — SODIUM CHLORIDE 0.9% IV SOLUTION: Freq: Once | INTRAVENOUS | Status: AC

## 2023-12-26 MED ORDER — METOPROLOL TARTRATE 5 MG/5ML IV SOLN
2.5000 mg | Freq: Four times a day (QID) | INTRAVENOUS | Status: DC
  Administered 2023-12-26 – 2023-12-27 (×5): 2.5 mg via INTRAVENOUS
  Filled 2023-12-26 (×5): qty 5

## 2023-12-26 NOTE — Progress Notes (Signed)
Called lab to see if patient's blood had arrived yet and they stated it had not. They stated they did not know when it would get here. I asked them to please call me when it arrived. They stated they would

## 2023-12-26 NOTE — Progress Notes (Signed)
   12/26/23 0945  Assess: MEWS Score  BP 122/79  MAP (mmHg) 94  ECG Heart Rate (!) 111  Resp (!) 27  Level of Consciousness Alert  Assess: MEWS Score  MEWS Temp 0  MEWS Systolic 0  MEWS Pulse 2  MEWS RR 2  MEWS LOC 0  MEWS Score 4  MEWS Score Color Red  Assess: if the MEWS score is Yellow or Red  Were vital signs accurate and taken at a resting state? Yes  MEWS guidelines implemented  Yes, red  Treat  MEWS Interventions Considered administering scheduled or prn medications/treatments as ordered  Take Vital Signs  Increase Vital Sign Frequency  Red: Q1hr x2, continue Q4hrs until patient remains green for 12hrs  Escalate  MEWS: Escalate Red: Discuss with charge nurse and notify provider. Consider notifying RRT. If remains red for 2 hours consider need for higher level of care  Provider Notification  Provider Name/Title Dr. Mahala Menghini  Date Provider Notified 12/26/23  Time Provider Notified [phone]  Method of Notification Page (secure chat)  Notification Reason Change in status  Provider response See new orders;Other (Comment) (Dr. Laural Benes at bedside)  Date of Provider Response 12/26/23  Time of Provider Response 1000  Assess: SIRS CRITERIA  SIRS Temperature  0  SIRS Respirations  1  SIRS Pulse 1  SIRS WBC 0  SIRS Score Sum  2

## 2023-12-26 NOTE — Progress Notes (Signed)
Patient has been responsive only to voice for most of shift, with very short periods of being alert. MEWS has fluctuated between yellow and red due to HR and RR, Dr. Mahala Menghini made aware, Dr. Laural Benes at bedside to assess patient. Patient unable to safely take PO meds at this time, Dr. Mahala Menghini made aware.

## 2023-12-26 NOTE — Progress Notes (Signed)
   12/26/23 2258  Assess: MEWS Score  Temp 98.8 F (37.1 C)  BP 120/65  MAP (mmHg) 81  Pulse Rate (!) 101  Resp (!) 30  SpO2 100 %  O2 Device Room Air  Assess: MEWS Score  MEWS Temp 0  MEWS Systolic 0  MEWS Pulse 1  MEWS RR 2  MEWS LOC 1  MEWS Score 4  MEWS Score Color Red  Assess: if the MEWS score is Yellow or Red  Were vital signs accurate and taken at a resting state? Yes  Does the patient meet 2 or more of the SIRS criteria? Yes  Does the patient have a confirmed or suspected source of infection? Yes  MEWS guidelines implemented  No, previously red, continue vital signs every 4 hours  Notify: Charge Nurse/RN  Name of Charge Nurse/RN Notified Roque Lias, RN  Assess: SIRS CRITERIA  SIRS Temperature  0  SIRS Respirations  1  SIRS Pulse 1  SIRS WBC 0  SIRS Score Sum  2

## 2023-12-26 NOTE — Progress Notes (Signed)
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 intake--- found to have AKI on admission with elevation of creatinine to 1.8 also had hematuria and was bladder scan P Eliquis was held--significant leukocytosis of 32 2/10 became febrile tachycardic tachypneic meeting SIRS criteria, blood culture = gram-positive cocci aerobic anaerobic, MRSA ID consulted-MRI right foot performed 2/11 MRI showed abscess right great toe  Procedures 2/10 transthoracic echo EF 55-60% mitral valve AV valve not defined well 2/12 transmetatarsal amputation right leg 2/13 TEE perfomred no veg    Plan  Sepsis on admit--MRSA bacteremia--R gr8 toe Amputation 2/12  ID, general surgery consulted-amputation as above-wound looks clean--postop wound care per general surgery-PT as tolerated PICC line placed after negative TEE/negative BCx 2 on 2/11 as above with no vegetation-will need daptomycin EOT 02/03/2024, weekly CK remove PICC line  Myelodysplastic syndrome follows Dr. Ellin Saba Leukocytosis from sepsis improving-last infusion 12/14/2022 Luspatercept  Acute blood loss anemia secondary to surgery, hematuria, MDS Transfusing 1 unit PRBC-Place iron studies for next labs in a.m., ferrous sulfate added 325 this admission  Hematuria and and pyuria on  admission--urine culture negative on 2/9 No reports of hematuria started back Eliquis 2/14 no reports of blood from rectum periodic lab draws additionally  History of PE A-fib CHADVASC about 4 on chronic Eliquis Supposed to be on Coreg 3.125 twice daily-was not on it-this has been resumed Eliquis 2.5 as above twice daily  Cirrhosis with presumed EtOH Lasix currently on hold Aldactone currently on hold other than for transfusion  dementia  continues on Lexapro 20, Aricept 10 Added Zydis at bedtime for agitation  AAA with iliac artery aneurysm status post repairs Outpatient follow-up  Family wishes to take him home-PICC line has been placed but he has been confused overnight and is a little bit tachycardic and needs a blood transfusion-we will observe him 1 more day and reassess   DVT prophylaxis: Eliquis  Status is: Inpatient Remains inpatient appropriate because:      Subjective:  Remained confused overnight received Ativan peri-procedurally PICC line Nursing reporting tachycardia hypertension today He is unable to give ROS at the bedside and seems a little bit incoherent I am not sure if he is going to be able to take p.o. meds unless he wakes up a bit more No family is present   Objective + exam Vitals:   12/26/23 0945 12/26/23 1012 12/26/23 1048 12/26/23 1050  BP: 122/79  132/88   Pulse:  70    Resp: (!) 27   19  Temp:      TempSrc:      SpO2:  100%    Weight:      Height:       Filed Weights   12/20/23 0957  Weight: 89.8 kg    Examination:  Ill-appearing white male little bit tachycardic arouses some but then closes his eyes and goes back to  sleep Chest is clear anteriorly unable to examine posterior lateral lung fields S1-S2 seems to be in sinus tach versus A-fib on monitors Abdomen is soft no rebound no guarding ROM is intact   Data Reviewed: reviewed   CBC    Component Value Date/Time   WBC 10.0 12/26/2023 0412   RBC 2.55 (L) 12/26/2023 0412    HGB 7.2 (L) 12/26/2023 0412   HGB 10.0 (L) 10/28/2023 1118   HCT 25.9 (L) 12/26/2023 0412   HCT 32.8 (L) 10/28/2023 1118   PLT 128 (L) 12/26/2023 0412   PLT 214 10/28/2023 1118   MCV 101.6 (H) 12/26/2023 0412   MCV 99 (H) 10/28/2023 1118   MCH 28.2 12/26/2023 0412   MCHC 27.8 (L) 12/26/2023 0412   RDW 19.5 (H) 12/26/2023 0412   RDW 17.2 (H) 10/28/2023 1118   LYMPHSABS 1.9 12/26/2023 0412   LYMPHSABS 2.1 10/28/2023 1118   MONOABS 1.4 (H) 12/26/2023 0412   EOSABS 0.0 12/26/2023 0412   EOSABS 0.1 10/28/2023 1118   BASOSABS 0.0 12/26/2023 0412   BASOSABS 0.1 10/28/2023 1118      Latest Ref Rng & Units 12/26/2023    4:12 AM 12/25/2023    5:45 AM 12/24/2023    4:49 AM  CMP  Glucose 70 - 99 mg/dL 161  096  045   BUN 8 - 23 mg/dL 36  29  28   Creatinine 0.61 - 1.24 mg/dL 4.09  8.11  9.14   Sodium 135 - 145 mmol/L 147  143  138   Potassium 3.5 - 5.1 mmol/L 3.5  3.4  3.4   Chloride 98 - 111 mmol/L 115  111  108   CO2 22 - 32 mmol/L 19  20  19    Calcium 8.9 - 10.3 mg/dL 7.9  8.2  8.0     Scheduled Meds:  sodium chloride   Intravenous Once   acetaminophen  650 mg Oral Once   acetaminophen  650 mg Oral Once   Chlorhexidine Gluconate Cloth  6 each Topical Daily   diphenhydrAMINE  25 mg Oral Once   donepezil  10 mg Oral q morning   escitalopram  20 mg Oral q morning   ferrous sulfate  325 mg Oral Q breakfast   furosemide  20 mg Intravenous Once   lubiprostone  24 mcg Oral BID WC   metoprolol tartrate  2.5 mg Intravenous Q6H   mupirocin ointment  1 Application Nasal BID   OLANZapine zydis  5 mg Oral q1800   pantoprazole  40 mg Oral Daily   sodium chloride flush  10-40 mL Intracatheter Q12H   Continuous Infusions:  DAPTOmycin 700 mg (12/25/23 1755)    Time  32  Rhetta Mura, MD  [facility]

## 2023-12-26 NOTE — Progress Notes (Signed)
Notified by lab that unit of pRBCs must be delivered from [place] due to antibodies. Dr. Mahala Menghini made aware of delay.

## 2023-12-26 NOTE — Progress Notes (Signed)
2 Days Post-Op  Subjective: Patient resting comfortably.  Objective: Vital signs in last 24 hours: Temp:  [99.2 F (37.3 C)-99.5 F (37.5 C)] 99.5 F (37.5 C) (02/15 0402) Pulse Rate:  [70-119] 70 (02/15 1012) Resp:  [18-31] 19 (02/15 1050) BP: (116-137)/(70-88) 132/88 (02/15 1048) SpO2:  [95 %-100 %] 100 % (02/15 1012) Last BM Date : 12/24/23  Intake/Output from previous day: 02/14 0701 - 02/15 0700 In: 10 [I.V.:10] Out: 550 [Urine:550] Intake/Output this shift: No intake/output data recorded.  Skin: Right great toe amputation site healing well.  Dressing removed.  Packing removed.  Lab Results:  Recent Labs    12/25/23 0545 12/26/23 0412  WBC 14.1* 10.0  HGB 7.7* 7.2*  HCT 27.2* 25.9*  PLT 134* 128*   BMET Recent Labs    12/25/23 0545 12/26/23 0412  NA 143 147*  K 3.4* 3.5  CL 111 115*  CO2 20* 19*  GLUCOSE 147* 143*  BUN 29* 36*  CREATININE 1.04 1.20  CALCIUM 8.2* 7.9*   PT/INR No results for input(s): "LABPROT", "INR" in the last 72 hours.  Studies/Results: DG Chest Portable 1 View Result Date: 12/25/2023 CLINICAL DATA:  PICC line placement EXAM: PORTABLE CHEST 1 VIEW COMPARISON:  12/20/2023 FINDINGS: Right PICC line tip: Lower SVC. No pneumothorax or complicating feature. Upper normal heart size. Low lung volumes. No blunting of the costophrenic angles. No significant bony findings. IMPRESSION: 1. Right PICC line tip: lower SVC. No pneumothorax or complicating feature. 2. Low lung volumes. Electronically Signed   By: Gaylyn Rong M.D.   On: 12/25/2023 17:06   [place] EKG SITE RITE Result Date: 12/24/2023 If Site Rite image not attached, placement could not be confirmed due to current cardiac rhythm.   Anti-infectives: Anti-infectives (From admission, onward)    Start     Dose/Rate Route Frequency Ordered Stop   12/25/23 0000  daptomycin (CUBICIN) IVPB        700 mg Intravenous Every 24 hours 12/25/23 1229 02/04/24 2359   12/24/23 1500   DAPTOmycin (CUBICIN) IVPB 700 mg/149mL premix        8 mg/kg  89.8 kg 200 mL/hr over 30 Minutes Intravenous Daily 12/24/23 1043     12/24/23 1130  DAPTOmycin (CUBICIN) IVPB 700 mg/120mL premix  Status:  Discontinued        8 mg/kg  89.8 kg 200 mL/hr over 30 Minutes Intravenous Daily 12/24/23 1040 12/24/23 1043   12/23/23 1000  vancomycin (VANCOREADY) IVPB 1750 mg/350 mL  Status:  Discontinued        1,750 mg 175 mL/hr over 120 Minutes Intravenous Every 48 hours 12/21/23 0415 12/21/23 0835   12/22/23 0600  vancomycin (VANCOREADY) IVPB 1500 mg/300 mL  Status:  Discontinued        1,500 mg 150 mL/hr over 120 Minutes Intravenous Every 24 hours 12/21/23 0835 12/24/23 1040   12/21/23 1000  cefTRIAXone (ROCEPHIN) 2 g in sodium chloride 0.9 % 100 mL IVPB  Status:  Discontinued        2 g 200 mL/hr over 30 Minutes Intravenous Every 24 hours 12/21/23 0852 12/23/23 1256   12/21/23 0515  vancomycin (VANCOREADY) IVPB 1750 mg/350 mL        1,750 mg 175 mL/hr over 120 Minutes Intravenous  Once 12/21/23 0415 12/21/23 1610       Assessment/Plan: Impression: Amputation site healing well on postoperative day 3.  Packing has been removed.  May keep wound clean with soap and water daily.  May continue nonweightbearing ambulation.  If needed, he may do heel bearing only.  Sutures stay in for approximately 2 weeks.  LOS: 6 days    David Duarte 12/26/2023

## 2023-12-27 DIAGNOSIS — N179 Acute kidney failure, unspecified: Secondary | ICD-10-CM | POA: Diagnosis not present

## 2023-12-27 LAB — CBC WITH DIFFERENTIAL/PLATELET
Abs Immature Granulocytes: 0.7 10*3/uL — ABNORMAL HIGH (ref 0.00–0.07)
Basophils Absolute: 0 10*3/uL (ref 0.0–0.1)
Basophils Relative: 0 %
Eosinophils Absolute: 0 10*3/uL (ref 0.0–0.5)
Eosinophils Relative: 0 %
HCT: 29.6 % — ABNORMAL LOW (ref 39.0–52.0)
Hemoglobin: 8.8 g/dL — ABNORMAL LOW (ref 13.0–17.0)
Lymphocytes Relative: 7 %
Lymphs Abs: 0.7 10*3/uL (ref 0.7–4.0)
MCH: 29.6 pg (ref 26.0–34.0)
MCHC: 29.7 g/dL — ABNORMAL LOW (ref 30.0–36.0)
MCV: 99.7 fL (ref 80.0–100.0)
Metamyelocytes Relative: 1 %
Monocytes Absolute: 0.5 10*3/uL (ref 0.1–1.0)
Monocytes Relative: 5 %
Myelocytes: 3 %
Neutro Abs: 8.3 10*3/uL — ABNORMAL HIGH (ref 1.7–7.7)
Neutrophils Relative %: 81 %
Platelets: 106 10*3/uL — ABNORMAL LOW (ref 150–400)
Promyelocytes Relative: 3 %
RBC: 2.97 MIL/uL — ABNORMAL LOW (ref 4.22–5.81)
RDW: 19.1 % — ABNORMAL HIGH (ref 11.5–15.5)
WBC: 10.3 10*3/uL (ref 4.0–10.5)
nRBC: 1.3 % — ABNORMAL HIGH (ref 0.0–0.2)

## 2023-12-27 LAB — BASIC METABOLIC PANEL
Anion gap: 15 (ref 5–15)
BUN: 37 mg/dL — ABNORMAL HIGH (ref 8–23)
CO2: 18 mmol/L — ABNORMAL LOW (ref 22–32)
Calcium: 7.8 mg/dL — ABNORMAL LOW (ref 8.9–10.3)
Chloride: 120 mmol/L — ABNORMAL HIGH (ref 98–111)
Creatinine, Ser: 1.26 mg/dL — ABNORMAL HIGH (ref 0.61–1.24)
GFR, Estimated: 57 mL/min — ABNORMAL LOW (ref >60)
Glucose, Bld: 145 mg/dL — ABNORMAL HIGH (ref 70–99)
Potassium: 3.4 mmol/L — ABNORMAL LOW (ref 3.5–5.1)
Sodium: 153 mmol/L — ABNORMAL HIGH (ref 135–145)

## 2023-12-27 LAB — CULTURE, BLOOD (ROUTINE X 2)
Culture: NO GROWTH
Culture: NO GROWTH
Special Requests: ADEQUATE
Special Requests: ADEQUATE

## 2023-12-27 LAB — PREPARE RBC (CROSSMATCH)

## 2023-12-27 MED ORDER — ENSURE ENLIVE PO LIQD: 237.0000 mL | Freq: Two times a day (BID) | ORAL | Status: DC

## 2023-12-27 MED ORDER — LORAZEPAM 2 MG/ML PO CONC: 1.0000 mg | ORAL | Status: DC | PRN

## 2023-12-27 MED ORDER — LORAZEPAM 2 MG/ML IJ SOLN
1.0000 mg | INTRAMUSCULAR | Status: DC | PRN
  Administered 2023-12-28 – 2023-12-29 (×3): 1 mg via INTRAVENOUS
  Filled 2023-12-27 (×3): qty 1

## 2023-12-27 MED ORDER — MORPHINE SULFATE (CONCENTRATE) 10 MG /0.5 ML PO SOLN: 5.0000 mg | ORAL | Status: DC | PRN

## 2023-12-27 MED ORDER — GLYCOPYRROLATE 0.2 MG/ML IJ SOLN: 0.2000 mg | INTRAMUSCULAR | Status: DC | PRN

## 2023-12-27 MED ORDER — OLANZAPINE 5 MG PO TBDP: 2.5000 mg | ORAL_TABLET | Freq: Every evening | ORAL | Status: DC | PRN

## 2023-12-27 MED ORDER — GLYCOPYRROLATE 1 MG PO TABS: 1.0000 mg | ORAL_TABLET | ORAL | Status: DC | PRN

## 2023-12-27 MED ORDER — DEXTROSE 5 % IV SOLN: INTRAVENOUS | Status: DC

## 2023-12-27 MED ORDER — LORAZEPAM 1 MG PO TABS: 1.0000 mg | ORAL_TABLET | ORAL | Status: DC | PRN

## 2023-12-27 MED ORDER — BIOTENE DRY MOUTH MT LIQD: 15.0000 mL | OROMUCOSAL | Status: DC | PRN

## 2023-12-27 NOTE — Progress Notes (Addendum)
Sees separate IPAL progress note  Patient not doing well still lethargic BUN/creatinine climbing sodium now 157 not eating only took a little bit of juice when I examined him he was lethargic in mittens and unable to orient whereas he previously was Patient is now DNR   Pleas Koch, MD [facility] 2:44 PM

## 2023-12-27 NOTE — Plan of Care (Signed)
  Problem: Education: Goal: Knowledge of General Education information will improve Description: Including pain rating scale, medication(s)/side effects and non-pharmacologic comfort measures Outcome: Not Progressing   Problem: Health Behavior/Discharge Planning: Goal: Ability to manage health-related needs will improve Outcome: Not Progressing   Problem: Clinical Measurements: Goal: Ability to maintain clinical measurements within normal limits will improve Outcome: Not Progressing Goal: Will remain free from infection Outcome: Not Progressing Goal: Diagnostic test results will improve Outcome: Not Progressing

## 2023-12-27 NOTE — Progress Notes (Signed)
  Interdisciplinary Goals of Care Family Meeting   Date carried out: 12/27/2023  Location of the meeting: Phone conference  Member's involved: Physician, Bedside Registered Nurse, and Family Member or next of kin  Durable Power of Attorney or acting medical decision maker: Discussed case with Sylvan Sookdeo who is his wife  Discussion: We discussed goals of care for [employer] .   I explained to his wife that he is now a little bit more lethargic not really eating or drinking has not taken meds in over 48 hours and kidney function is declining as well as mental state is not clear We had a long discussion about options and it looks like his grandson is a hospice nurse and separately she has had conversations with him I do think that with his multiple illnesses his prognosis is guarded Family agrees to DO NOT RESUSCITATE and I have changed CODE STATUS to comfort care as this is not what he would want and not what his wife thinks he would want We will look for hospice agency and Catha Nottingham close to his wife in the next 24 to 48 hours  Code status:   Code Status: Do not attempt resuscitation (DNR) PRE-ARREST INTERVENTIONS DESIRED   Disposition: In-patient comfort care  Time spent for the meeting: 46    Rhetta Mura, MD  12/27/2023, 2:44 PM

## 2023-12-27 NOTE — TOC Progression Note (Addendum)
Transition of Care [facility]) - Progression Note    Patient Details  Name: David Duarte MRN: [id number] Date of Birth: 1941/09/22  [facility]) CM/SW Contact  Elliot Gault, LCSW Phone Number: 12/27/2023, 2:56 PM  Clinical Narrative:     TOC following. Have reviewed MD notes and see that pt has become less responsive and may need referral for hospice care in the next 1-2 days.   TOC will follow up in AM to further assist.  Received update from MD stating family requesting referral to [facility] for residential hospice. Referral sent. Will follow up in AM for update on timing of RN evaluation.   Expected Discharge Plan: Home w Home Health Services Barriers to Discharge: Continued Medical Work up  Expected Discharge Plan and Services In-house Referral: Clinical Social Work   Post Acute Care Choice: Home Health Living arrangements for the past 2 months: Single Family Home                 DME Arranged: 3-N-1, Hospital bed, Other see comment Michiel Sites lift) DME Agency: [employer] Date DME Agency Contacted: 12/24/23   Representative spoke with at DME Agency: Ian Malkin HH Arranged: PT, RN HH Agency: [facility] Date [facility] Agency Contacted: 12/25/23 Time HH Agency Contacted: [phone] Representative spoke with at [facility] Agency: Kandee Keen   Social Determinants of Health (SDOH) Interventions SDOH Screenings   Food Insecurity: No Food Insecurity (12/20/2023)  Housing: Low Risk  (12/23/2023)  Transportation Needs: No Transportation Needs (12/20/2023)  Utilities: Not At Risk (12/20/2023)  Depression (PHQ2-9): Low Risk  (10/28/2023)  Financial Resource Strain: Low Risk  (12/06/2023)   Received from [facility]  Physical Activity: Inactive (12/06/2023)   Received from [facility]  Social Connections: Patient Unable To Answer (12/21/2023)  Stress: No Stress Concern Present (12/06/2023)   Received from [facility]  Tobacco Use: Medium Risk (12/24/2023)  Health Literacy: Low  Risk  (12/06/2023)   Received from [facility]    Readmission Risk Interventions     No data to display

## 2023-12-27 NOTE — Progress Notes (Signed)
3 Days Post-Op  Subjective: Patient resting comfortably.  Objective: Vital signs in last 24 hours: Temp:  [97.7 F (36.5 C)-100.7 F (38.2 C)] 98.8 F (37.1 C) (02/16 0500) Pulse Rate:  [33-101] 96 (02/15 2321) Resp:  [28-33] 30 (02/15 2321) BP: (120-135)/(65-80) 129/68 (02/16 0500) SpO2:  [90 %-100 %] 100 % (02/15 2258) Last BM Date : 12/24/23  Intake/Output from previous day: 02/15 0701 - 02/16 0700 In: 825.8 [P.O.:480; I.V.:39.5; Blood:306.3] Out: 1000 [Urine:1000] Intake/Output this shift: No intake/output data recorded.  Extremities: Incision healing well.  Minimal erythema noted.  No drainage noted.  Lab Results:  Recent Labs    12/26/23 0412 12/27/23 0601  WBC 10.0 10.3  HGB 7.2* 8.8*  HCT 25.9* 29.6*  PLT 128* 106*   BMET Recent Labs    12/26/23 0412 12/27/23 0601  NA 147* 153*  K 3.5 3.4*  CL 115* 120*  CO2 19* 18*  GLUCOSE 143* 145*  BUN 36* 37*  CREATININE 1.20 1.26*  CALCIUM 7.9* 7.8*   PT/INR No results for input(s): "LABPROT", "INR" in the last 72 hours.  Studies/Results: DG CHEST PORT 1 VIEW Result Date: 12/26/2023 CLINICAL DATA:  Pneumonia. EXAM: PORTABLE CHEST 1 VIEW COMPARISON:  12/25/2023 FINDINGS: There is a right arm PICC line with tip at the superior cavoatrial junction. The heart size appears within normal limits. There is no pleural fluid, interstitial edema, or consolidative change. Lung volumes are low. Visualized osseous structures are unremarkable. IMPRESSION: Low lung volumes. No acute findings. Electronically Signed   By: Signa Kell M.D.   On: 12/26/2023 11:59   DG Chest Portable 1 View Result Date: 12/25/2023 CLINICAL DATA:  PICC line placement EXAM: PORTABLE CHEST 1 VIEW COMPARISON:  12/20/2023 FINDINGS: Right PICC line tip: Lower SVC. No pneumothorax or complicating feature. Upper normal heart size. Low lung volumes. No blunting of the costophrenic angles. No significant bony findings. IMPRESSION: 1. Right PICC line tip:  lower SVC. No pneumothorax or complicating feature. 2. Low lung volumes. Electronically Signed   By: Gaylyn Rong M.D.   On: 12/25/2023 17:06    Anti-infectives: Anti-infectives (From admission, onward)    Start     Dose/Rate Route Frequency Ordered Stop   12/25/23 0000  daptomycin (CUBICIN) IVPB        700 mg Intravenous Every 24 hours 12/25/23 1229 02/04/24 2359   12/24/23 1500  DAPTOmycin (CUBICIN) IVPB 700 mg/130mL premix        8 mg/kg  89.8 kg 200 mL/hr over 30 Minutes Intravenous Daily 12/24/23 1043     12/24/23 1130  DAPTOmycin (CUBICIN) IVPB 700 mg/136mL premix  Status:  Discontinued        8 mg/kg  89.8 kg 200 mL/hr over 30 Minutes Intravenous Daily 12/24/23 1040 12/24/23 1043   12/23/23 1000  vancomycin (VANCOREADY) IVPB 1750 mg/350 mL  Status:  Discontinued        1,750 mg 175 mL/hr over 120 Minutes Intravenous Every 48 hours 12/21/23 0415 12/21/23 0835   12/22/23 0600  vancomycin (VANCOREADY) IVPB 1500 mg/300 mL  Status:  Discontinued        1,500 mg 150 mL/hr over 120 Minutes Intravenous Every 24 hours 12/21/23 0835 12/24/23 1040   12/21/23 1000  cefTRIAXone (ROCEPHIN) 2 g in sodium chloride 0.9 % 100 mL IVPB  Status:  Discontinued        2 g 200 mL/hr over 30 Minutes Intravenous Every 24 hours 12/21/23 0852 12/23/23 1256   12/21/23 0515  vancomycin (VANCOREADY) IVPB  1750 mg/350 mL        1,750 mg 175 mL/hr over 120 Minutes Intravenous  Once 12/21/23 0415 12/21/23 1610       Assessment/Plan: s/p Procedure(s): TRANSESOPHAGEAL ECHOCARDIOGRAM (TEE) Impression: Patient healing well, status post right great toe amputation.  He should follow-up with [facility] on 01/07/2024 for wound assessment and suture removal.  Will follow peripherally with you.  Would keep incision clean and dry with soap and water.  LOS: 7 days    David Duarte 12/27/2023

## 2023-12-28 DIAGNOSIS — B9562 Methicillin resistant Staphylococcus aureus infection as the cause of diseases classified elsewhere: Secondary | ICD-10-CM | POA: Diagnosis present

## 2023-12-28 DIAGNOSIS — C9310 Chronic myelomonocytic leukemia not having achieved remission: Secondary | ICD-10-CM | POA: Diagnosis present

## 2023-12-28 DIAGNOSIS — Z833 Family history of diabetes mellitus: Secondary | ICD-10-CM

## 2023-12-28 DIAGNOSIS — K219 Gastro-esophageal reflux disease without esophagitis: Secondary | ICD-10-CM | POA: Diagnosis present

## 2023-12-28 DIAGNOSIS — Z841 Family history of disorders of kidney and ureter: Secondary | ICD-10-CM

## 2023-12-28 DIAGNOSIS — Z87891 Personal history of nicotine dependence: Secondary | ICD-10-CM

## 2023-12-28 DIAGNOSIS — F05 Delirium due to known physiological condition: Secondary | ICD-10-CM | POA: Diagnosis present

## 2023-12-28 DIAGNOSIS — D631 Anemia in chronic kidney disease: Secondary | ICD-10-CM | POA: Diagnosis present

## 2023-12-28 DIAGNOSIS — M51369 Other intervertebral disc degeneration, lumbar region without mention of lumbar back pain or lower extremity pain: Secondary | ICD-10-CM | POA: Diagnosis present

## 2023-12-28 DIAGNOSIS — I480 Paroxysmal atrial fibrillation: Secondary | ICD-10-CM | POA: Diagnosis present

## 2023-12-28 DIAGNOSIS — N179 Acute kidney failure, unspecified: Secondary | ICD-10-CM | POA: Diagnosis not present

## 2023-12-28 DIAGNOSIS — T8743 Infection of amputation stump, right lower extremity: Secondary | ICD-10-CM | POA: Diagnosis present

## 2023-12-28 DIAGNOSIS — Z7901 Long term (current) use of anticoagulants: Secondary | ICD-10-CM

## 2023-12-28 DIAGNOSIS — Z751 Person awaiting admission to adequate facility elsewhere: Secondary | ICD-10-CM

## 2023-12-28 DIAGNOSIS — Z820 Family history of epilepsy and other diseases of the nervous system: Secondary | ICD-10-CM

## 2023-12-28 DIAGNOSIS — K7460 Unspecified cirrhosis of liver: Secondary | ICD-10-CM | POA: Diagnosis present

## 2023-12-28 DIAGNOSIS — Z515 Encounter for palliative care: Principal | ICD-10-CM

## 2023-12-28 DIAGNOSIS — Z8679 Personal history of other diseases of the circulatory system: Secondary | ICD-10-CM

## 2023-12-28 DIAGNOSIS — R7881 Bacteremia: Secondary | ICD-10-CM | POA: Diagnosis present

## 2023-12-28 DIAGNOSIS — E871 Hypo-osmolality and hyponatremia: Secondary | ICD-10-CM | POA: Diagnosis present

## 2023-12-28 DIAGNOSIS — E1122 Type 2 diabetes mellitus with diabetic chronic kidney disease: Secondary | ICD-10-CM | POA: Diagnosis present

## 2023-12-28 DIAGNOSIS — Z86711 Personal history of pulmonary embolism: Secondary | ICD-10-CM

## 2023-12-28 DIAGNOSIS — E7800 Pure hypercholesterolemia, unspecified: Secondary | ICD-10-CM | POA: Diagnosis present

## 2023-12-28 DIAGNOSIS — Y835 Amputation of limb(s) as the cause of abnormal reaction of the patient, or of later complication, without mention of misadventure at the time of the procedure: Secondary | ICD-10-CM | POA: Diagnosis present

## 2023-12-28 DIAGNOSIS — N189 Chronic kidney disease, unspecified: Secondary | ICD-10-CM | POA: Diagnosis present

## 2023-12-28 LAB — AEROBIC/ANAEROBIC CULTURE W GRAM STAIN (SURGICAL/DEEP WOUND)

## 2023-12-28 LAB — ECHO TEE

## 2023-12-28 MED ORDER — BIOTENE DRY MOUTH MT LIQD: 15.0000 mL | OROMUCOSAL | Status: DC | PRN

## 2023-12-28 MED ORDER — MORPHINE SULFATE (CONCENTRATE) 10 MG /0.5 ML PO SOLN: 5.0000 mg | ORAL | 0 refills | Status: DC | PRN

## 2023-12-28 MED ORDER — GLYCOPYRROLATE 0.2 MG/ML IJ SOLN: 0.2000 mg | INTRAMUSCULAR | 0 refills | Status: DC | PRN

## 2023-12-28 MED ORDER — LORAZEPAM 2 MG/ML IJ SOLN: 1.0000 mg | INTRAMUSCULAR | 0 refills | Status: DC | PRN

## 2023-12-28 MED ORDER — ACETAMINOPHEN 160 MG/5ML PO SOLN: 500.0000 mg | ORAL | Status: DC | PRN

## 2023-12-28 NOTE — Plan of Care (Signed)

## 2023-12-28 NOTE — TOC Progression Note (Addendum)
Transition of Care [facility]) - Progression Note    Patient Details  Name: David Duarte MRN: [id number] Date of Birth: September 28, 1941  Transition [facility]) CM/SW Contact  Isabella Bowens, [place] Phone Number: 12/28/2023, 2:01 PM  Clinical Narrative:    CSW was made aware to follow up with [facility] for possible inpatient placement. Samantha with [facility] was contacted and she stated that the nurse had a patient she needed to assess before seeing patient and that she = would get back with CSW to determine if patient is more needing of bed then other patient. TOC updated doctor and will continue to follow.   Nurse with [facility] called CSW back stating that patient and family agreeable to virtual hospice bed in hospital.   Expected Discharge Plan: Home w Home Health Services Barriers to Discharge:  (Assessment for Hospice Residenital)  Expected Discharge Plan and Services In-house Referral: Clinical Social Work   Post Acute Care Choice: Home Health Living arrangements for the past 2 months: Single Family Home Expected Discharge Date: 12/28/23               DME Arranged: 3-N-1, Hospital bed, Other see comment Michiel Sites lift) DME Agency: [employer] Date DME Agency Contacted: 12/24/23   Representative spoke with at DME Agency: Ian Malkin HH Arranged: PT, RN HH Agency: [facility] Date [facility] Agency Contacted: 12/25/23 Time HH Agency Contacted: [phone] Representative spoke with at [facility] Agency: Kandee Keen   Social Determinants of Health (SDOH) Interventions SDOH Screenings   Food Insecurity: No Food Insecurity (12/20/2023)  Housing: Low Risk  (12/23/2023)  Transportation Needs: No Transportation Needs (12/20/2023)  Utilities: Not At Risk (12/20/2023)  Depression (PHQ2-9): Low Risk  (10/28/2023)  Financial Resource Strain: Low Risk  (12/06/2023)   Received from [facility]  Physical Activity: Inactive (12/06/2023)   [facility]  Social Connections: Patient Unable  To Answer (12/21/2023)  Stress: No Stress Concern Present (12/06/2023)   Received from [facility]  Tobacco Use: Medium Risk (12/24/2023)  Health Literacy: Low Risk  (12/06/2023)   Received from [facility]    Readmission Risk Interventions    12/28/2023    2:00 PM  Readmission Risk Prevention Plan  Transportation Screening Complete  HRI or Home Care Consult Complete  Social Work Consult for Recovery Care Planning/Counseling Complete  Palliative Care Screening Complete  Medication Review [employer]) Complete

## 2023-12-28 NOTE — Plan of Care (Signed)
  Problem: Role Relationship: Goal: Family's ability to cope with current situation will improve Outcome: Progressing   Problem: Coping: Goal: Ability to identify and develop effective coping behavior will improve Outcome: Not Applicable   Problem: Respiratory: Goal: Verbalizations of increased ease of respirations will increase Outcome: Not Applicable

## 2023-12-28 NOTE — Discharge Summary (Signed)
Physician Discharge Summary  David Duarte:[id number] DOB: 1941-05-06 DOA: 12/20/2023  PCP: Sonny Masters, FNP  Admit date: 12/20/2023 Discharge date: 12/28/2023  Time spent: 23 minutes  Recommendations for Outpatient Follow-up:  Patient to d/c eventually to Hospice house for end of life care  Discharge Diagnoses:  MAIN problem for hospitalization   Sepsis on admission MRSA bacteremia source likely right great toe Now comfort care  Please see below for itemized issues addressed in HOpsital- refer to other progress notes for clarity if needed  Discharge Condition: Guarded  Diet recommendation: Comfort feeds  Filed Weights   12/20/23 0957  Weight: 89.8 kg    History of present illness:  83 year old white male home dwelling 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 intake--- found to have AKI on admission with elevation of creatinine to 1.8 also had hematuria and was bladder scan P Eliquis was held--significant leukocytosis of 32 2/10 became febrile tachycardic tachypneic meeting SIRS criteria, blood culture = gram-positive cocci aerobic anaerobic, MRSA ID consulted-MRI right foot performed 2/11 MRI showed abscess right great toe   Procedures 2/10 transthoracic echo EF 55-60% mitral valve AV valve not defined well 2/12 transmetatarsal amputation right leg 2/13 TEE perfomred no veg  Over the course of time from 2 15 through 2 16 patient became increasingly lethargic and combative he pulled out several IVs-we added situs he continued on some meds  but his p.o. intake continued to be rather poor He was transfused on 2/15 and although his AKI improved this did not reflect in mentation improving either He was a little bit non-cooperative and required soft restraints on his hands to prevent himself from pulling out his Foley catheter or his PICC line that was eventually placed The plan initially was to discharge him to his home with his wife assisting with management but he did not recover in terms of mental acuity and remained confused We did goals of care on 2/16 with his wife and patient's wife determined that it would be best to pursue hospice given his advanced age worsening mentation and poor survivability Patient is currently discharged and is awaiting hospice house placement I have signed DNR and placed all comfort measure orders on the chart  patient can discharge anytime that is available  Discharge Exam: Vitals:   12/27/23 2143 12/28/23 0611  BP: (!) 141/73 138/73  Pulse: (!) 107 (!) 110  Resp: (!) 32 (!) 34  Temp: 98.6 F (37 C) 98.9 F (37.2 C)  SpO2: 96% 96%    Subj on day of d/c   Incoherent mumbling but a little bit more awake than he was previously  General Exam on discharge  S1-S2 no murmur Incoherent cannot make any sense Abdomen soft no rebound Foley in place ROM intact Wound on right great toe seems clean  Discharge Instructions   Discharge Instructions     Leave dressing on - Keep it clean, dry, and intact until clinic visit   Complete by: As directed       Allergies as of 12/28/2023   No Known Allergies      Medication List     STOP taking these medications  atorvastatin 20 MG tablet Commonly known as: LIPITOR   carvedilol 3.125 MG tablet Commonly known as: COREG   donepezil 10 MG tablet Commonly known as: ARICEPT   Eliquis 5 MG Tabs tablet Generic drug: apixaban   escitalopram 20 MG tablet Commonly known as: LEXAPRO   Fenofibric Acid 135 MG Cpdr   furosemide 20 MG  tablet Commonly known as: LASIX   glucose blood test strip   hydrocortisone cream 1 %   Insulin Pen Needle 32G X 4 MM Misc   lubiprostone 24 MCG capsule Commonly known as: AMITIZA   omeprazole 40 MG capsule Commonly known as: PRILOSEC   ondansetron 8 MG disintegrating tablet Commonly known as: ZOFRAN-ODT   TRUEplus Lancets 28G Misc   UltiCare Micro Pen Needles 32G X 4 MM Misc Generic drug: Insulin Pen Needle       TAKE these medications    acetaminophen 500 MG tablet Commonly known as: TYLENOL Take 1,000 mg by mouth every 6 (six) hours as needed for moderate pain.   antiseptic oral rinse Liqd Apply 15 mLs topically as needed for dry mouth.   glycopyrrolate 0.2 MG/ML injection Commonly known as: ROBINUL Inject 1 mL (0.2 mg total) into the skin every 4 (four) hours as needed (excessive secretions).   LORazepam 2 MG/ML injection Commonly known as: ATIVAN Inject 0.5 mLs (1 mg total) into the vein every 4 (four) hours as needed for anxiety.   morphine CONCENTRATE 10 mg / 0.5 ml concentrated solution Take 0.25 mLs (5 mg total) by mouth every 2 (two) hours as needed for moderate pain (pain score 4-6) (or dyspnea).               Discharge Care Instructions  (From admission, onward)           Start     Ordered   12/28/23 0000  Leave dressing on - Keep it clean, dry, and intact until clinic visit        12/28/23 0722           No Known Allergies  Follow-up Information     [facility] Follow up.   Specialty: Home Health Services Contact information: [address] [place] [address] [phone]         [facility] Follow up.          [facility] Follow up.   Why: For equipment Contact information: [address] [address] [phone]         [facility]. Call on 01/07/2024.   Why: For suture removal Contact information: [address]  [address] [place] [address] [phone]                 The results of significant diagnostics from this hospitalization (including imaging, microbiology, ancillary and laboratory) are listed below for reference.    Significant Diagnostic Studies: DG CHEST PORT 1 VIEW Result Date: 12/26/2023 CLINICAL DATA:  Pneumonia. EXAM: PORTABLE CHEST 1 VIEW COMPARISON:  12/25/2023 FINDINGS: There is a right arm PICC line with tip at the superior cavoatrial junction. The heart size appears within normal limits. There is no pleural fluid, interstitial edema, or consolidative change. Lung volumes are low. Visualized osseous structures are unremarkable. IMPRESSION: Low lung volumes. No acute findings. Electronically Signed   By: Signa Kell M.D.   On: 12/26/2023 11:59   DG Chest Portable 1 View Result Date: 12/25/2023 CLINICAL DATA:  PICC line placement EXAM: PORTABLE  CHEST 1 VIEW COMPARISON:  12/20/2023 FINDINGS: Right PICC line tip: Lower SVC. No pneumothorax or complicating feature. Upper normal heart size. Low lung volumes. No blunting of the costophrenic angles. No significant bony findings. IMPRESSION: 1. Right PICC line tip: lower SVC. No pneumothorax or complicating feature. 2. Low lung volumes. Electronically Signed   By: Gaylyn Rong M.D.   On: 12/25/2023 17:06   [place] EKG SITE RITE Result Date: 12/24/2023 If Site Rite image not attached, placement could not be confirmed due to current cardiac rhythm.  ECHOCARDIOGRAM COMPLETE Result Date: 12/22/2023    ECHOCARDIOGRAM REPORT   Patient Name:   David Duarte Date of Exam: 12/21/2023 Medical Rec #:  [id number]          Height:       72.0 in Accession #:    [id number]         Weight:       198.0 lb Date of Birth:  11-02-1941          BSA:          2.121 m Patient Age:    82 years           BP:           101/48 mmHg Patient Gender: M                  HR:           91 bpm. 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 Arrythmias:Atrial Fibrillation; Risk                 Factors:Hypertension, Diabetes, Dyslipidemia and Former Smoker.  Sonographer:    Celesta Gentile RCS Referring Phys: [id number] [facility] : [facility] IMPRESSIONS  1. No obvious vegetations on mitral valve or tricuspid valve. Aortic and pulmonic valves are not well visualized. Consider TEE for further evaluation, if clinically indicated.  2. Left ventricular ejection fraction, by estimation, is 55 to 60%. The left ventricle has normal function. Left ventricular endocardial border not optimally defined to evaluate regional wall motion. Left ventricular diastolic function could not be evaluated.  3. Right ventricular systolic function is normal. The right ventricular size is normal.  4. Left atrial size was severely dilated.  5. The mitral valve is normal in structure. Trivial mitral valve regurgitation. No evidence of mitral stenosis.  6. The aortic valve was not well visualized. Aortic valve regurgitation is not visualized. Mild aortic valve stenosis. Aortic valve area, by VTI measures 1.63 cm. Aortic valve mean gradient measures 10.0 mmHg. Aortic valve Vmax measures 2.18 m/s.  7. The inferior vena cava is dilated in size but collapsibility could not be evaluated. Comparison(s): No significant change from prior study. FINDINGS  Left Ventricle: Left ventricular ejection fraction, by estimation, is 55 to 60%. The left ventricle has normal function. Left ventricular endocardial border not optimally defined to evaluate regional wall motion. The left ventricular internal cavity size was normal in size. There is no left ventricular hypertrophy. Left ventricular diastolic function could not be evaluated due to atrial fibrillation. Left ventricular diastolic function could not be evaluated. Right Ventricle:  The right ventricular size is normal. No increase in right ventricular wall thickness. Right ventricular systolic function  is normal. Left Atrium: Left atrial size was severely dilated. Right Atrium: Right atrial size was normal in size. Pericardium: There is no evidence of pericardial effusion. Mitral Valve: The mitral valve is normal in structure. Trivial mitral valve regurgitation. No evidence of mitral valve stenosis. Tricuspid Valve: The tricuspid valve is normal in structure. Tricuspid valve regurgitation is not demonstrated. No evidence of tricuspid stenosis. Aortic Valve: The aortic valve was not well visualized. Aortic valve regurgitation is not visualized. Mild aortic stenosis is present. Aortic valve mean gradient measures 10.0 mmHg. Aortic valve peak gradient measures 19.0 mmHg. Aortic valve area, by VTI  measures 1.63 cm. Pulmonic Valve: The pulmonic valve was not well visualized. Pulmonic valve regurgitation is not visualized. No evidence of pulmonic stenosis. Aorta: The aortic root is normal in size and structure. Venous: The inferior vena cava is dilated in size but collapsibility could not be evaluated. IAS/Shunts: No atrial level shunt detected by color flow Doppler.  LEFT VENTRICLE PLAX 2D LVIDd:         4.30 cm LVIDs:         2.90 cm LV PW:         1.00 cm LV IVS:        1.10 cm LVOT diam:     2.00 cm LV SV:         62 LV SV Index:   29 LVOT Area:     3.14 cm  RIGHT VENTRICLE TAPSE (M-mode): 1.7 cm LEFT ATRIUM              Index        RIGHT ATRIUM           Index LA diam:        3.40 cm  1.60 cm/m   RA Area:     21.20 cm LA Vol (A2C):   87.4 ml  41.20 ml/m  RA Volume:   61.40 ml  28.94 ml/m LA Vol (A4C):   109.0 ml 51.38 ml/m LA Biplane Vol: 98.5 ml  46.43 ml/m  AORTIC VALVE AV Area (Vmax):    1.50 cm AV Area (Vmean):   1.71 cm AV Area (VTI):     1.63 cm AV Vmax:           218.00 cm/s AV Vmean:          146.500 cm/s AV VTI:            0.382 m AV Peak Grad:      19.0 mmHg AV Mean Grad:       10.0 mmHg LVOT Vmax:         104.00 cm/s LVOT Vmean:        79.700 cm/s LVOT VTI:          0.198 m LVOT/AV VTI ratio: 0.52  AORTA Ao Root diam: 3.20 cm MITRAL VALVE MV Area (PHT): 3.37 cm     SHUNTS MV Decel Time: 225 msec     Systemic VTI:  0.20 m MV E velocity: 122.00 cm/s  Systemic Diam: 2.00 cm Vishnu Priya Mallipeddi Electronically signed by Winfield Rast Mallipeddi Signature Date/Time: 12/22/2023/9:10:41 AM    Final    MR FOOT RIGHT W WO CONTRAST Result Date: 12/21/2023 CLINICAL DATA:  2nd toe pain. 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 EXAM: MRI OF THE RIGHT FOREFOOT WITHOUT AND WITH CONTRAST TECHNIQUE: Multiplanar, multisequence MR imaging of the right forefoot was performed before and after the administration of intravenous contrast. CONTRAST:  9mL GADAVIST GADOBUTROL  1 MMOL/ML IV SOLN COMPARISON:  Right foot radiographs 12/20/2023 FINDINGS: Soft tissues A there is mild-to-moderate soft tissue swelling of the great toe, greatest at the distal lateral aspect. There is multilobular decreased T1 increased T2 signal within the region of the distal dorsal lateral aspect of the great toe, both under the distal 11 mm of the toenail (coronal series 9, image 11 and axial series 5, image 46) and bordering the dorsolateral aspect of the distal phalanx of the great toe (coronal series 9 images 11 through 13 and axial series 5 images 41 through 45). There is mild subcutaneous fat edema and swelling of the dorsal forefoot subcutaneous fat. Bones/Joint/Cartilage Adjacent to the likely abscesses in the distal dorsal and distal dorsolateral aspect of the great toe, there is mild marrow edema within the distal greater than proximal aspect of the great toe distal phalanx concerning for acute osteomyelitis (sagittal series 8 images 20 through 22). Moderate great toe metatarsophalangeal joint space narrowing, cartilage thinning, and peripheral osteophytosis. There is a well corticated chronic  ossicle measuring up to 10 mm dorsal to the distal first metatarsal head and neck. Mild likely degenerative marrow edema within the deep aspect of the lateral great toe metatarsophalangeal joint sesamoid (series 5, image 26). Moderate to high-grade cartilage thinning with mild-to-moderate peripheral osteophytes of the navicular-cuneiform articulations. Within the limitations of inhomogeneous fat saturation at the distal aspect of the second through fifth toes, no definitive additional phalangeal marrow edema is seen to indicate evidence of acute osteomyelitis. Ligaments The Lisfranc ligament complex is intact. The metatarsophalangeal and interphalangeal collateral ligaments are intact. The sesamoid-phalangeal ligaments of the great toe plantar plate are intact. Muscles and Tendons There is moderate edema within the plantar forefoot musculature, nonspecific. IMPRESSION: 1. Mild-to-moderate soft tissue swelling of the great toe, greatest at the distal lateral aspect. There are multilobular fluid collections suspicious for abscesses within the distal dorsal and distal dorsolateral aspect of the great toe, both under the distal 11 mm of the toenail and bordering the dorsolateral aspect of the distal phalanx of the great toe. 2. There is mild marrow edema within the distal greater than proximal aspect of the great toe distal phalanx concerning for acute osteomyelitis. 3. Moderate great toe metatarsophalangeal joint osteoarthritis. Electronically Signed   By: Neita Garnet M.D.   On: 12/21/2023 17:44   DG Knee Complete 4 Views Right Result Date: 12/20/2023 CLINICAL DATA:  Larey Seat EXAM: RIGHT KNEE - COMPLETE 4+ VIEW COMPARISON:  None Available. FINDINGS: Frontal, bilateral oblique, lateral views of the right knee are obtained. No fracture, subluxation, or dislocation. Three compartmental osteoarthritis, most pronounced within the medial compartment with moderate joint space narrowing and osteophyte formation. No joint  effusion. Diffuse atherosclerosis. Soft tissues are unremarkable. IMPRESSION: 1. Moderate 3 compartmental osteoarthritis. 2. No acute fracture. 3. Atherosclerosis. Electronically Signed   By: Sharlet Salina M.D.   On: 12/20/2023 16:06   DG Foot Complete Right Result Date: 12/20/2023 CLINICAL DATA:  Larey Seat EXAM: RIGHT FOOT COMPLETE - 3+ VIEW COMPARISON:  None Available. FINDINGS: Frontal, oblique, and lateral views of the right foot are obtained. Hammertoe deformities are noted. There are no acute displaced fractures. Multifocal osteoarthritis, greatest at the first metatarsophalangeal joint and throughout the midfoot. Soft tissues are unremarkable. IMPRESSION: 1. No acute displaced fracture. 2. Multifocal osteoarthritis. 3. Hammertoe deformities. Electronically Signed   By: Sharlet Salina M.D.   On: 12/20/2023 16:06   CT ABDOMEN PELVIS WO CONTRAST Result Date: 12/20/2023 CLINICAL DATA:  Left lower quadrant  abdominal pain. EXAM: CT ABDOMEN AND PELVIS WITHOUT CONTRAST TECHNIQUE: Multidetector CT imaging of the abdomen and pelvis was performed following the standard protocol without IV contrast. RADIATION DOSE REDUCTION: This exam was performed according to the departmental dose-optimization program which includes automated exposure control, adjustment of the mA and/or kV according to patient size and/or use of iterative reconstruction technique. COMPARISON:  CT angio abdomen and pelvis 10/22/2023 FINDINGS: Lower chest: The lung bases are clear without focal nodule, mass, or airspace disease. The heart size is normal. Extensive coronary artery calcifications are present. No significant pleural or pericardial effusion is present. Hepatobiliary: No focal liver abnormality is seen. No gallstones, gallbladder wall thickening, or biliary dilatation. The gallbladder is moderately distended without inflammatory change. This may be secondary to anorexia. Pancreas: Unremarkable. No pancreatic ductal dilatation or surrounding  inflammatory changes. Spleen: The spleen is mildly enlarged, stable. Measures 13 cm cephalo caudad. Adrenals/Urinary Tract: Adrenal glands are normal bilaterally. A simple cyst along the posterior aspect of the right kidney is stable at 3.1 cm. No other focal lesions are present. No stone or obstruction is present. Ureters are within normal limits. The urinary bladder is normal. Stomach/Bowel: The stomach and duodenum are within normal limits. Small bowel is unremarkable. The terminal ileum is normal. The appendix is visualized and normal. The ascending and transverse colon are within normal limits. Diverticular changes are present in the descending and sigmoid colon. No inflammatory changes are present in the sigmoid colon. 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 Atherosclerotic changes within the femoral arteries beyond the graft in the aorta proximal to the graft are stable. No significant adenopathy is present. Reproductive: The prostate is mildly enlarged, stable, measuring 5.0 cm. Other: No abdominal wall hernia or abnormality. No abdominopelvic ascites. Musculoskeletal: The vertebral body heights and alignment are normal. No focal osseous lesion is present. Bony pelvis is normal. Hips are located and within normal limits bilaterally. IMPRESSION: 1. No acute or focal lesion to explain the patient's symptoms. 2. Descending and sigmoid diverticulosis without diverticulitis. 3. 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 4. Stable mild splenomegaly. 5. Stable simple cyst of the right kidney. No follow-up imaging recommended. 6. Stable mild prostatomegaly. 7. Coronary artery disease. Electronically Signed   By: Marin Roberts M.D.   On: 12/20/2023 11:35   DG Chest Portable 1 View Result Date: 12/20/2023 CLINICAL DATA:  weakness EXAM: PORTABLE CHEST - 1 VIEW COMPARISON:  None available. FINDINGS:  Cardiomediastinal silhouette and pulmonary vasculature are within normal limits. Lungs are clear. IMPRESSION: No acute cardiopulmonary process. Electronically Signed   By: Acquanetta Belling M.D.   On: 12/20/2023 11:20    Microbiology: Recent Results (from the past 240 hours)  Blood culture (routine x 2)     Status: Abnormal (Preliminary result)   Collection Time: 12/20/23  2:34 PM   Specimen: BLOOD LEFT ARM  Result Value Ref Range Status   Specimen Description   Final    BLOOD LEFT ARM BOTTLES DRAWN AEROBIC AND ANAEROBIC Performed at [facility], [address] [place], [place] [address]    Special Requests   Final    Blood Culture adequate volume Performed at [facility], [address], [place], [place] [address]    Culture  Setup Time   Final    GRAM POSITIVE COCCI IN BOTH AEROBIC AND ANAEROBIC BOTTLES CRITICAL RESULT CALLED TO, READ BACK BY AND VERIFIED WITH: ELLER,J ON 12/21/23 @  0419 BY PURDIE,J CRITICAL RESULT CALLED TO, READ BACK BY AND VERIFIED WITH: PHARMD S.HURTH AT 12/21/2023 BY T.SAAD.    Culture (A)  Final    METHICILLIN RESISTANT STAPHYLOCOCCUS AUREUS Sent to [facility] for further susceptibility testing. Performed at [facility], [address], [place], [place] [address]    Report Status PENDING  Incomplete   Organism ID, Bacteria METHICILLIN RESISTANT STAPHYLOCOCCUS AUREUS  Final      Susceptibility   Methicillin resistant staphylococcus aureus - MIC*    CIPROFLOXACIN <=0.5 SENSITIVE Sensitive     ERYTHROMYCIN 1 INTERMEDIATE Intermediate     GENTAMICIN <=0.5 SENSITIVE Sensitive     OXACILLIN >=4 RESISTANT Resistant     TETRACYCLINE <=1 SENSITIVE Sensitive     VANCOMYCIN <=0.5 SENSITIVE Sensitive     TRIMETH/SULFA <=10 SENSITIVE Sensitive     CLINDAMYCIN <=0.25 SENSITIVE Sensitive     RIFAMPIN <=0.5 SENSITIVE Sensitive     Inducible Clindamycin NEGATIVE Sensitive     LINEZOLID 2 SENSITIVE Sensitive     * METHICILLIN RESISTANT STAPHYLOCOCCUS AUREUS  Blood  Culture ID Panel (Reflexed)     Status: Abnormal   Collection Time: 12/20/23  2:34 PM  Result Value Ref Range Status   Enterococcus faecalis NOT DETECTED NOT DETECTED Final   Enterococcus Faecium NOT DETECTED NOT DETECTED Final   Listeria monocytogenes NOT DETECTED NOT DETECTED Final   Staphylococcus species DETECTED (A) NOT DETECTED Final    Comment: CRITICAL RESULT CALLED TO, READ BACK BY AND VERIFIED WITH: PHARMD S.HURTH AT 12/21/2023 BY T.SAAD.    Staphylococcus aureus (BCID) DETECTED (A) NOT DETECTED Final    Comment: Methicillin (oxacillin)-resistant Staphylococcus aureus (MRSA). MRSA is predictably resistant to beta-lactam antibiotics (except ceftaroline). Preferred therapy is vancomycin unless clinically contraindicated. Patient requires contact precautions if  hospitalized. CRITICAL RESULT CALLED TO, READ BACK BY AND VERIFIED WITH: PHARMD S.HURTH AT 12/21/2023 BY T.SAAD.    Staphylococcus epidermidis NOT DETECTED NOT DETECTED Final   Staphylococcus lugdunensis NOT DETECTED NOT DETECTED Final   Streptococcus species NOT DETECTED NOT DETECTED Final   Streptococcus agalactiae NOT DETECTED NOT DETECTED Final   Streptococcus pneumoniae NOT DETECTED NOT DETECTED Final   Streptococcus pyogenes NOT DETECTED NOT DETECTED Final   A.calcoaceticus-baumannii NOT DETECTED NOT DETECTED Final   Bacteroides fragilis NOT DETECTED NOT DETECTED Final   Enterobacterales NOT DETECTED NOT DETECTED Final   Enterobacter cloacae complex NOT DETECTED NOT DETECTED Final   Escherichia coli NOT DETECTED NOT DETECTED Final   Klebsiella aerogenes NOT DETECTED NOT DETECTED Final   Klebsiella oxytoca NOT DETECTED NOT DETECTED Final   Klebsiella pneumoniae NOT DETECTED NOT DETECTED Final   Proteus species NOT DETECTED NOT DETECTED Final   Salmonella species NOT DETECTED NOT DETECTED Final   Serratia marcescens NOT DETECTED NOT DETECTED Final   Haemophilus influenzae NOT DETECTED NOT DETECTED Final    Neisseria meningitidis NOT DETECTED NOT DETECTED Final   Pseudomonas aeruginosa NOT DETECTED NOT DETECTED Final   Stenotrophomonas maltophilia NOT DETECTED NOT DETECTED Final   Candida albicans NOT DETECTED NOT DETECTED Final   Candida auris NOT DETECTED NOT DETECTED Final   Candida glabrata NOT DETECTED NOT DETECTED Final   Candida krusei NOT DETECTED NOT DETECTED Final   Candida parapsilosis NOT DETECTED NOT DETECTED Final   Candida tropicalis NOT DETECTED NOT DETECTED Final   Cryptococcus neoformans/gattii NOT DETECTED NOT DETECTED Final   Meth resistant mecA/C and MREJ DETECTED (A) NOT DETECTED Final    Comment: CRITICAL RESULT CALLED TO, READ BACK BY  AND VERIFIED WITH: PHARMD S.HURTH AT 12/21/2023 BY T.SAAD. Performed at [facility] [facility], [address], [place], [place] [address]   MIC (1 Drug)-Blood culture; 12/20/2023; BLOOD; MRSA; Daptomycin     Status: Abnormal   Collection Time: 12/20/23  2:34 PM   Specimen: BLOOD  Result Value Ref Range Status   Min Inhibitory Conc (1 Drug) Preliminary report (A)  Final    Comment: (NOTE) Performed At: [facility] [address] [place], [place] [id number] Jolene Schimke MD WU:[id number]    Source BLOOD  Final    Comment: Performed at [facility], [address], [place], [place] [address]  Blood culture (routine x 2)     Status: Abnormal   Collection Time: 12/20/23  2:36 PM   Specimen: Left Antecubital; Blood  Result Value Ref Range Status   Specimen Description   Final    LEFT ANTECUBITAL BOTTLES DRAWN AEROBIC AND ANAEROBIC Performed at [facility], [address], [place], [place] [address]    Special Requests   Final    Blood Culture results may not be optimal due to an inadequate volume of blood received in culture bottles Performed at [facility], [address], [place], [place] [address]    Culture  Setup Time   Final    GRAM POSITIVE COCCI IN BOTH AEROBIC AND ANAEROBIC BOTTLES CRITICAL RESULT CALLED TO,  READ BACK BY AND VERIFIED WITH: ELLER,J ON 12/21/23 AT 0346 BY PURDIE,J CRITICAL VALUE NOTED.  VALUE IS CONSISTENT WITH PREVIOUSLY REPORTED AND CALLED VALUE.    Culture (A)  Final    STAPHYLOCOCCUS AUREUS SUSCEPTIBILITIES PERFORMED ON PREVIOUS CULTURE WITHIN THE LAST 5 DAYS. Performed at [facility], [address] [place], [place] [address]    Report Status 12/23/2023 FINAL  Final  Urine Culture (for pregnant, neutropenic or urologic patients or patients with an indwelling urinary catheter)     Status: None   Collection Time: 12/20/23  4:00 PM   Specimen: Urine, Clean Catch  Result Value Ref Range Status   Specimen Description   Final    URINE, CLEAN CATCH Performed at [facility], [address] [place], [place] [address]    Special Requests   Final    NONE Performed at [facility], [address], [place], [place] [address]    Culture   Final    NO GROWTH Performed at [facility], [address], [place], [place] [address]    Report Status 12/22/2023 FINAL  Final  Culture, blood (Routine X 2) w Reflex to ID Panel     Status: None   Collection Time: 12/22/23  5:32 AM   Specimen: BLOOD  Result Value Ref Range Status   Specimen Description BLOOD BLOOD RIGHT ARM  Final   Special Requests   Final    BOTTLES DRAWN AEROBIC AND ANAEROBIC Blood Culture adequate volume   Culture   Final    NO GROWTH 5 DAYS Performed at [facility], [address], [place], [place] [address]    Report Status 12/27/2023 FINAL  Final  Culture, blood (Routine X 2) w Reflex to ID Panel     Status: None   Collection Time: 12/22/23  5:33 AM   Specimen: BLOOD  Result Value Ref Range Status   Specimen Description BLOOD BLOOD RIGHT HAND  Final   Special Requests   Final    BOTTLES DRAWN AEROBIC AND ANAEROBIC Blood Culture adequate volume   Culture   Final  NO GROWTH 5 DAYS Performed at [facility], [address] [place], [place] [address]    Report Status 12/27/2023 FINAL  Final   Aerobic/Anaerobic Culture w Gram Stain (surgical/deep wound)     Status: None (Preliminary result)   Collection Time: 12/23/23 11:19 AM   Specimen: Path fluid; Body Fluid  Result Value Ref Range Status   Specimen Description   Final    ABSCESS RIGHT TOE Performed at [facility], [address] [place], [place] [address]    Special Requests   Final    NONE Performed at [facility], [address], [place], [place] [address]    Gram Stain   Final    RARE WBC PRESENT,BOTH PMN AND MONONUCLEAR MODERATE GRAM POSITIVE COCCI IN PAIRS Performed at [facility], [address], [place], [place] [address]    Culture   Final    ABUNDANT METHICILLIN RESISTANT STAPHYLOCOCCUS AUREUS NO ANAEROBES ISOLATED; CULTURE IN PROGRESS FOR 5 DAYS    Report Status PENDING  Incomplete   Organism ID, Bacteria METHICILLIN RESISTANT STAPHYLOCOCCUS AUREUS  Final      Susceptibility   Methicillin resistant staphylococcus aureus - MIC*    CIPROFLOXACIN <=0.5 SENSITIVE Sensitive     ERYTHROMYCIN 1 INTERMEDIATE Intermediate     GENTAMICIN <=0.5 SENSITIVE Sensitive     OXACILLIN >=4 RESISTANT Resistant     TETRACYCLINE <=1 SENSITIVE Sensitive     VANCOMYCIN 1 SENSITIVE Sensitive     TRIMETH/SULFA <=10 SENSITIVE Sensitive     CLINDAMYCIN <=0.25 SENSITIVE Sensitive     RIFAMPIN <=0.5 SENSITIVE Sensitive     Inducible Clindamycin NEGATIVE Sensitive     LINEZOLID 2 SENSITIVE Sensitive     * ABUNDANT METHICILLIN RESISTANT STAPHYLOCOCCUS AUREUS  MRSA Next Gen by PCR, Nasal     Status: Abnormal   Collection Time: 12/23/23  1:05 PM   Specimen: Nasal Mucosa; Nasal Swab  Result Value Ref Range Status   MRSA by PCR Next Gen DETECTED (A) NOT DETECTED Final    Comment: RESULT CALLED TO, READ BACK BY AND VERIFIED WITH: D CETRONE AT 1438 12/23/2023 BY A WILSON (NOTE) The GeneXpert MRSA Assay (FDA approved for NASAL specimens only), is one component of a comprehensive MRSA colonization  surveillance program. It is not intended to diagnose MRSA infection nor to guide or monitor treatment for MRSA infections. Test performance is not FDA approved in patients less than 25 years old. Performed at [facility], [address] [place], [place] [address]      Labs: Basic Metabolic Panel: Recent Labs  Lab 12/22/23 0532 12/23/23 0409 12/24/23 0449 12/25/23 0545 12/26/23 0412 12/27/23 0601  NA 136 137 138 143 147* 153*  K 3.6 3.3* 3.4* 3.4* 3.5 3.4*  CL 104 104 108 111 115* 120*  CO2 17* 21* 19* 20* 19* 18*  GLUCOSE 136* 136* 150* 147* 143* 145*  BUN 34* 27* 28* 29* 36* 37*  CREATININE 1.09 0.91 1.00 1.04 1.20 1.26*  CALCIUM 8.4* 8.4* 8.0* 8.2* 7.9* 7.8*  MG 2.1 2.0  --   --   --   --    Liver Function Tests: No results for input(s): "AST", "ALT", "ALKPHOS", "BILITOT", "PROT", "ALBUMIN" in the last 168 hours. No results for input(s): "LIPASE", "AMYLASE" in the last 168 hours. No results for input(s): "AMMONIA" in the last 168 hours. CBC: Recent Labs  Lab 12/23/23 0409 12/24/23 0449 12/25/23 0545 12/26/23 0412 12/27/23 0601  WBC 19.8* 16.9* 14.1* 10.0 10.3  NEUTROABS  --  14.2* 10.3* 6.1 8.3*  HGB 8.5* 7.5* 7.7* 7.2* 8.8*  HCT 30.4* 26.3* 27.2* 25.9* 29.6*  MCV 100.7* 97.8 99.6 101.6* 99.7  PLT 149* 128* 134* 128* 106*   Cardiac Enzymes: Recent Labs  Lab 12/25/23 0545  CKTOTAL 58   BNP: BNP (last 3 results) No results for input(s): "BNP" in the last 8760 hours.  ProBNP (last 3 results) No results for input(s): "PROBNP" in the last 8760 hours.  CBG: Recent Labs  Lab 12/23/23 0901 12/23/23 1212 12/24/23 1020  GLUCAP 142* 130* 134*    Signed:  Rhetta Mura MD   [facility] 12/28/2023, 7:30 AM

## 2023-12-29 ENCOUNTER — Encounter: Payer: Self-pay | Admitting: Hematology

## 2023-12-29 ENCOUNTER — Inpatient Hospital Stay (HOSPITAL_COMMUNITY)
Admission: AD | Admit: 2023-12-29 | Discharge: 2024-01-09 | DRG: 951 | Disposition: E | Attending: Family Medicine | Admitting: Family Medicine

## 2023-12-29 DIAGNOSIS — B9562 Methicillin resistant Staphylococcus aureus infection as the cause of diseases classified elsewhere: Secondary | ICD-10-CM | POA: Diagnosis present

## 2023-12-29 DIAGNOSIS — C9310 Chronic myelomonocytic leukemia not having achieved remission: Secondary | ICD-10-CM | POA: Diagnosis present

## 2023-12-29 DIAGNOSIS — Z87891 Personal history of nicotine dependence: Secondary | ICD-10-CM

## 2023-12-29 DIAGNOSIS — K219 Gastro-esophageal reflux disease without esophagitis: Secondary | ICD-10-CM | POA: Diagnosis present

## 2023-12-29 DIAGNOSIS — I480 Paroxysmal atrial fibrillation: Secondary | ICD-10-CM | POA: Diagnosis present

## 2023-12-29 DIAGNOSIS — T8743 Infection of amputation stump, right lower extremity: Secondary | ICD-10-CM | POA: Diagnosis present

## 2023-12-29 DIAGNOSIS — Z820 Family history of epilepsy and other diseases of the nervous system: Secondary | ICD-10-CM

## 2023-12-29 DIAGNOSIS — Y835 Amputation of limb(s) as the cause of abnormal reaction of the patient, or of later complication, without mention of misadventure at the time of the procedure: Secondary | ICD-10-CM | POA: Diagnosis present

## 2023-12-29 DIAGNOSIS — Z86711 Personal history of pulmonary embolism: Secondary | ICD-10-CM | POA: Diagnosis not present

## 2023-12-29 DIAGNOSIS — Z515 Encounter for palliative care: Principal | ICD-10-CM

## 2023-12-29 DIAGNOSIS — A419 Sepsis, unspecified organism: Secondary | ICD-10-CM | POA: Diagnosis not present

## 2023-12-29 DIAGNOSIS — Z833 Family history of diabetes mellitus: Secondary | ICD-10-CM

## 2023-12-29 DIAGNOSIS — Z751 Person awaiting admission to adequate facility elsewhere: Secondary | ICD-10-CM

## 2023-12-29 DIAGNOSIS — Z841 Family history of disorders of kidney and ureter: Secondary | ICD-10-CM | POA: Diagnosis not present

## 2023-12-29 DIAGNOSIS — E871 Hypo-osmolality and hyponatremia: Secondary | ICD-10-CM | POA: Diagnosis present

## 2023-12-29 DIAGNOSIS — Z7901 Long term (current) use of anticoagulants: Secondary | ICD-10-CM

## 2023-12-29 DIAGNOSIS — K7460 Unspecified cirrhosis of liver: Secondary | ICD-10-CM | POA: Diagnosis present

## 2023-12-29 DIAGNOSIS — Z8679 Personal history of other diseases of the circulatory system: Secondary | ICD-10-CM | POA: Diagnosis not present

## 2023-12-29 DIAGNOSIS — M51369 Other intervertebral disc degeneration, lumbar region without mention of lumbar back pain or lower extremity pain: Secondary | ICD-10-CM | POA: Diagnosis present

## 2023-12-29 DIAGNOSIS — R7881 Bacteremia: Secondary | ICD-10-CM | POA: Diagnosis present

## 2023-12-29 DIAGNOSIS — R6521 Severe sepsis with septic shock: Secondary | ICD-10-CM | POA: Diagnosis not present

## 2023-12-29 DIAGNOSIS — N179 Acute kidney failure, unspecified: Secondary | ICD-10-CM | POA: Diagnosis present

## 2023-12-29 DIAGNOSIS — E1122 Type 2 diabetes mellitus with diabetic chronic kidney disease: Secondary | ICD-10-CM | POA: Diagnosis present

## 2023-12-29 DIAGNOSIS — F05 Delirium due to known physiological condition: Secondary | ICD-10-CM | POA: Diagnosis present

## 2023-12-29 DIAGNOSIS — N189 Chronic kidney disease, unspecified: Secondary | ICD-10-CM | POA: Diagnosis present

## 2023-12-29 DIAGNOSIS — D631 Anemia in chronic kidney disease: Secondary | ICD-10-CM | POA: Diagnosis present

## 2023-12-29 DIAGNOSIS — E7800 Pure hypercholesterolemia, unspecified: Secondary | ICD-10-CM | POA: Diagnosis present

## 2023-12-29 LAB — MINIMUM INHIBITORY CONC. (1 DRUG)

## 2023-12-29 LAB — MIC RESULT

## 2023-12-29 MED ORDER — BIOTENE DRY MOUTH MT LIQD: 15.0000 mL | OROMUCOSAL | Status: DC | PRN

## 2023-12-29 MED ORDER — ONDANSETRON 4 MG PO TBDP: 4.0000 mg | ORAL_TABLET | Freq: Four times a day (QID) | ORAL | Status: DC | PRN

## 2023-12-29 MED ORDER — SODIUM CHLORIDE 0.9% FLUSH: 10.0000 mL | Freq: Two times a day (BID) | INTRAVENOUS | Status: DC

## 2023-12-29 MED ORDER — MORPHINE 100MG IN NS 100ML (1MG/ML) PREMIX INFUSION: 5.0000 mg/h | INTRAVENOUS | Status: DC

## 2023-12-29 MED ORDER — OLANZAPINE 5 MG PO TBDP: 2.5000 mg | ORAL_TABLET | Freq: Every evening | ORAL | Status: DC | PRN

## 2023-12-29 MED ORDER — ORAL CARE MOUTH RINSE
15.0000 mL | OROMUCOSAL | Status: DC | PRN
Start: 2023-12-29 — End: 2023-12-30

## 2023-12-29 MED ORDER — POLYVINYL ALCOHOL 1.4 % OP SOLN: 1.0000 [drp] | Freq: Four times a day (QID) | OPHTHALMIC | Status: DC | PRN

## 2023-12-29 MED ORDER — GLYCOPYRROLATE 1 MG PO TABS: 1.0000 mg | ORAL_TABLET | ORAL | Status: DC | PRN

## 2023-12-29 MED ORDER — GLYCOPYRROLATE 0.2 MG/ML IJ SOLN: 0.2000 mg | INTRAMUSCULAR | Status: DC | PRN

## 2023-12-29 MED ORDER — HALOPERIDOL 0.5 MG PO TABS: 0.5000 mg | ORAL_TABLET | ORAL | Status: DC | PRN

## 2023-12-29 MED ORDER — LORAZEPAM 1 MG PO TABS: 1.0000 mg | ORAL_TABLET | ORAL | Status: DC | PRN

## 2023-12-29 MED ORDER — ACETAMINOPHEN 325 MG PO TABS: 650.0000 mg | ORAL_TABLET | Freq: Four times a day (QID) | ORAL | Status: DC | PRN

## 2023-12-29 MED ORDER — LORAZEPAM 2 MG/ML IJ SOLN: 1.0000 mg | INTRAMUSCULAR | Status: DC | PRN

## 2023-12-29 MED ORDER — MORPHINE 100MG IN NS 100ML (1MG/ML) PREMIX INFUSION
5.0000 mg/h | INTRAVENOUS | Status: DC
  Administered 2023-12-29: 5 mg/h via INTRAVENOUS
  Filled 2023-12-29: qty 100

## 2023-12-29 MED ORDER — HALOPERIDOL LACTATE 2 MG/ML PO CONC: 0.5000 mg | ORAL | Status: DC | PRN

## 2023-12-29 MED ORDER — HALOPERIDOL LACTATE 5 MG/ML IJ SOLN: 0.5000 mg | INTRAMUSCULAR | Status: DC | PRN

## 2023-12-29 MED ORDER — ACETAMINOPHEN 500 MG PO TABS: 1000.0000 mg | ORAL_TABLET | Freq: Four times a day (QID) | ORAL | Status: DC | PRN

## 2023-12-29 MED ORDER — ONDANSETRON HCL 4 MG/2ML IJ SOLN: 4.0000 mg | Freq: Four times a day (QID) | INTRAMUSCULAR | Status: DC | PRN

## 2023-12-29 MED ORDER — GLYCOPYRROLATE 0.2 MG/ML IJ SOLN
0.2000 mg | INTRAMUSCULAR | Status: DC | PRN
Start: 2023-12-29 — End: 2023-12-30

## 2023-12-29 MED ORDER — ACETAMINOPHEN 650 MG RE SUPP: 650.0000 mg | Freq: Four times a day (QID) | RECTAL | Status: DC | PRN

## 2023-12-29 MED ORDER — SODIUM CHLORIDE 0.9% FLUSH: 10.0000 mL | INTRAVENOUS | Status: DC | PRN

## 2023-12-29 MED ORDER — MORPHINE 100MG IN NS 100ML (1MG/ML) PREMIX INFUSION: 10.0000 mg/h | INTRAVENOUS | Status: DC

## 2023-12-29 MED ORDER — ACETAMINOPHEN 160 MG/5ML PO SOLN: 500.0000 mg | ORAL | Status: DC | PRN

## 2023-12-29 MED ORDER — MORPHINE SULFATE (CONCENTRATE) 10 MG /0.5 ML PO SOLN: 5.0000 mg | ORAL | Status: DC | PRN

## 2023-12-29 NOTE — Plan of Care (Signed)
  Problem: Education: Goal: Knowledge of General Education information will improve Description: Including pain rating scale, medication(s)/side effects and non-pharmacologic comfort measures Outcome: Not Progressing   Problem: Health Behavior/Discharge Planning: Goal: Ability to manage health-related needs will improve Outcome: Not Progressing   Problem: Nutrition: Goal: Adequate nutrition will be maintained Outcome: Not Progressing   

## 2023-12-29 NOTE — Care Management Important Message (Signed)
Important Message  Patient Details  Name: David Duarte MRN: [id number] Date of Birth: [birth year]   Important Message Given:  Yes - Medicare IM     Corey Harold 12/29/2023, 10:40 AM
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
## 2023-12-29 NOTE — Plan of Care (Signed)
  Problem: Education: Goal: Knowledge of General Education information will improve Description: Including pain rating scale, medication(s)/side effects and non-pharmacologic comfort measures Outcome: Progressing   Problem: Health Behavior/Discharge Planning: Goal: Ability to manage health-related needs will improve Outcome: Progressing   Problem: Clinical Measurements: Goal: Ability to maintain clinical measurements within normal limits will improve Outcome: Progressing Goal: Will remain free from infection Outcome: Progressing Goal: Diagnostic test results will improve Outcome: Progressing Goal: Respiratory complications will improve Outcome: Progressing Goal: Cardiovascular complication will be avoided Outcome: Progressing   Problem: Activity: Goal: Risk for activity intolerance will decrease Outcome: Progressing   Problem: Nutrition: Goal: Adequate nutrition will be maintained Outcome: Progressing   Problem: Coping: Goal: Level of anxiety will decrease Outcome: Progressing   Problem: Elimination: Goal: Will not experience complications related to bowel motility Outcome: Progressing Goal: Will not experience complications related to urinary retention Outcome: Progressing   Problem: Pain Managment: Goal: General experience of comfort will improve and/or be controlled Outcome: Progressing   Problem: Safety: Goal: Ability to remain free from injury will improve Outcome: Progressing   Problem: Skin Integrity: Goal: Risk for impaired skin integrity will decrease Outcome: Progressing   Problem: Education: Goal: Knowledge of the prescribed therapeutic regimen will improve Outcome: Progressing   Problem: Clinical Measurements: Goal: Quality of life will improve Outcome: Progressing   Problem: Role Relationship: Goal: Family's ability to cope with current situation will improve Outcome: Progressing Goal: Ability to verbalize concerns, feelings, and thoughts to  partner or family member will improve Outcome: Progressing   Problem: Pain Management: Goal: Satisfaction with pain management regimen will improve Outcome: Progressing

## 2023-12-30 DIAGNOSIS — A419 Sepsis, unspecified organism: Secondary | ICD-10-CM

## 2023-12-30 DIAGNOSIS — R6521 Severe sepsis with septic shock: Secondary | ICD-10-CM

## 2023-12-30 LAB — BPAM RBC
Blood Product Expiration Date: 202503232359
Blood Product Expiration Date: 202503232359
ISSUE DATE / TIME: 202502152250
ISSUE DATE / TIME: 202502152250
Unit Type and Rh: 5100
Unit Type and Rh: 202503262359
Unit Type and Rh: 5100

## 2023-12-30 LAB — TYPE AND SCREEN
ABO/RH(D): O POS
Antibody Screen: POSITIVE
Donor AG Type: NEGATIVE
Unit division: 0
Unit division: 0

## 2023-12-30 NOTE — Plan of Care (Signed)

## 2024-01-01 LAB — CULTURE, BLOOD (ROUTINE X 2): Special Requests: ADEQUATE

## 2024-01-04 ENCOUNTER — Inpatient Hospital Stay: Payer: Medicare Other

## 2024-01-04 ENCOUNTER — Inpatient Hospital Stay: Payer: Medicare Other | Admitting: Hematology

## 2024-01-06 ENCOUNTER — Inpatient Hospital Stay: Payer: Self-pay | Admitting: Family

## 2024-01-09 NOTE — Accreditation Note (Signed)
Patient expired at 0105 verified by 2nd nurse (supervisor), family and physician contacted. Patient post-mortem care rendered, currently awaiting transport to morgue.

## 2024-01-09 DEATH — deceased

## 2024-01-15 ENCOUNTER — Encounter: Payer: Self-pay | Admitting: Internal Medicine

## 2024-01-25 ENCOUNTER — Inpatient Hospital Stay: Payer: Medicare Other

## 2024-02-02 ENCOUNTER — Ambulatory Visit: Payer: No Typology Code available for payment source | Admitting: Gastroenterology

## 2024-02-09 NOTE — Death Summary Note (Signed)
 Death Summary  David Duarte:[id number] DOB: [birth year] DOA: 20-Jan-2024  PCP: Sonny Masters, FNP  Admit date: 20-Jan-2024 Date of Death: 02/11/24 Time of Death: unknown Notification: Sonny Masters, FNP notified of death of 02-11-24   History of present illness:  David Duarte is a 83 y.o. male with a history of  JEVAUN Duarte presented with complaint of sepsis on admission secondary to MRSA with great toe amputation and was on vancomycin--Dr. Lovell Sheehan saw him and performed the It appears that patient had progression of disease process during hospital stay mental status fluctuated to some degree during hospital stay and despite placing PICC line daptomycin and trying to manage his symptoms he became more and more delirious He was resumed on his home meds and we tried to manage some of his delirium with Zydus but unfortunately his mentation did not clear-he was combative at times and then became progressively more lethargic Ultimately because of his worsening lethargy and climbing BUN/creatinine as well as climbing sodium and poor p.o. intake he was lethargic and was unable to orient on several days with his worst symptoms being on the 15th and 16 February I had a detailed discussion with the patient's wife and family and she elected to pursue comfort measures Patient expired sometime on Jan 21, 2024   Final Diagnoses:  Bacteremia secondary to MRSA Hyponatremia secondary to poor p.o. intake AKI secondary to poor p.o. intake   The results of significant diagnostics from this hospitalization (including imaging, microbiology, ancillary and laboratory) are listed below for reference.    Significant Diagnostic Studies: ECHO TEE Result Date: 12/28/2023    TRANSESOPHOGEAL ECHO REPORT   Patient Name:   David Duarte Date of Exam: 12/24/2023 Medical Rec #:  [id number]          Height:       72.0 in Accession #:    [id number]         Weight:       198.0 lb Date of Birth:  1941/04/25           BSA:          2.121 m Patient Age:    82 years           BP:           142/87 mmHg Patient Gender: M                  HR:           93 bpm. [facility] Procedure: Transesophageal Echo, Cardiac Doppler and Color Doppler (Both            Spectral and Color Flow Doppler were utilized during procedure). Indications:     Endocarditis  History:         Patient has prior history of Echocardiogram examinations, most                  recent 12/21/2023. Risk Factors:Diabetes.  Sonographer:     Darlys Gales Referring Phys:  [id number] [facility] Diagnosing Phys: Winfield Rast Mallipeddi PROCEDURE: After discussion of the risks and benefits of a TEE, an informed consent was obtained from the patient. The transesophogeal probe was passed without difficulty through the esophogus of the patient. Imaged were obtained with the patient in a left lateral decubitus position. Sedation performed by different physician. The patient was monitored while under deep sedation. Anesthestetic sedation was provided intravenously by Anesthesiology: 181mg  of Propofol. Image quality was good.  The patient's  vital signs; including heart rate, blood pressure, and oxygen saturation; remained stable throughout the procedure. The patient developed no complications during the procedure.  IMPRESSIONS  1. Left ventricular ejection fraction, by estimation, is 55 to 60%. The left ventricle has normal function. Left ventricular endocardial border not optimally defined to evaluate regional wall motion. There is mild left ventricular hypertrophy. Left ventricular diastolic function could not be evaluated.  2. Right ventricular systolic function is normal. The right ventricular size is normal.  3. The mitral valve is normal in structure. Trivial mitral valve regurgitation. No evidence of mitral stenosis.  4. 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 There is mild calcification of the  aortic valve. Aortic valve regurgitation is not visualized. No aortic stenosis is present.  5. No left atrial/left atrial appendage thrombus was detected. The LAA emptying velocity was 34 cm/s. Conclusion(s)/Recommendation(s): Normal biventricular function without evidence of hemodynamically significant valvular heart disease. No evidence of vegetation/infective endocarditis on this transesophageael echocardiogram. FINDINGS  Left Ventricle: Left ventricular ejection fraction, by estimation, is 55 to 60%. The left ventricle has normal function. Left ventricular endocardial border not optimally defined to evaluate regional wall motion. Strain imaging was not performed. The left ventricular internal cavity size was normal in size. There is mild left ventricular hypertrophy. Left ventricular diastolic function could not be evaluated. Right Ventricle: The right ventricular size is normal. No increase in right ventricular wall thickness. Right ventricular systolic function is normal. Left Atrium: Left atrial size was not assessed. No left atrial/left atrial appendage thrombus was detected. The LAA emptying velocity was 34 cm/s. Right Atrium: Right atrial size was not assessed. Pericardium: There is no evidence of pericardial effusion. Mitral Valve: The mitral valve is normal in structure. Trivial mitral valve regurgitation. No evidence of mitral valve stenosis. Tricuspid Valve: The tricuspid valve is normal in structure. Tricuspid valve regurgitation is mild . No evidence of tricuspid stenosis. Aortic Valve: The aortic valve is normal in structure. There is mild calcification of the aortic valve. Aortic valve regurgitation is not visualized. Mild aortic stenosis is present. Pulmonic Valve: The pulmonic valve was normal in structure. Pulmonic valve regurgitation is not visualized. No evidence of pulmonic stenosis. Aorta: Aortic root could not be assessed. There is minimal (Grade I) layered plaque involving the ascending  aorta and aortic arch. Venous: The inferior vena cava was not well visualized. IAS/Shunts: No atrial level shunt detected by color flow Doppler. Additional Comments: 3D imaging was not performed. Spectral Doppler performed. Vishnu Priya Mallipeddi Electronically signed by Winfield Rast Mallipeddi Signature Date/Time: 12/28/2023/12:36:06 PM    Final (Updated)    DG CHEST PORT 1 VIEW Result Date: 12/26/2023 CLINICAL DATA:  Pneumonia. EXAM: PORTABLE CHEST 1 VIEW COMPARISON:  12/25/2023 FINDINGS: There is a right arm PICC line with tip at the superior cavoatrial junction. The heart size appears within normal limits. There is no pleural fluid, interstitial edema, or consolidative change. Lung volumes are low. Visualized osseous structures are unremarkable. IMPRESSION: Low lung volumes. No acute findings. Electronically Signed   By: Signa Kell M.D.   On: 12/26/2023 11:59   DG Chest Portable 1 View Result Date: 12/25/2023 CLINICAL DATA:  PICC line placement EXAM: PORTABLE CHEST 1 VIEW COMPARISON:  12/20/2023 FINDINGS: Right PICC line tip: Lower SVC. No pneumothorax or complicating feature. Upper normal heart size. Low lung volumes. No blunting of the costophrenic angles. No significant bony findings. IMPRESSION: 1. Right PICC line tip:  lower SVC. No pneumothorax or complicating feature. 2. Low lung volumes. Electronically Signed   By: Gaylyn Rong M.D.   On: 12/25/2023 17:06   [place] EKG SITE RITE Result Date: 12/24/2023 If Site Rite image not attached, placement could not be confirmed due to current cardiac rhythm.  ECHOCARDIOGRAM COMPLETE Result Date: 12/22/2023    ECHOCARDIOGRAM REPORT   Patient Name:   ROALD LUKACS Date of Exam: 12/21/2023 Medical Rec #:  [id number]          Height:       72.0 in Accession #:    [id number]         Weight:       198.0 lb Date of Birth:  [birth year]          BSA:          2.121 m Patient Age:    82 years           BP:           101/48 mmHg Patient Gender: M                   HR:           91 bpm. [facility] Procedure: 2D Echo, Cardiac Doppler and Color Doppler Indications:    Endocarditis l38  History:        Patient has prior history of Echocardiogram examinations, most                 recent 07/03/2022. Arrythmias:Atrial Fibrillation; Risk                 Factors:Hypertension, Diabetes, Dyslipidemia and Former Smoker.  Sonographer:    Celesta Gentile RCS Referring Phys: [id number] [facility] IMPRESSIONS  1. No obvious vegetations on mitral valve or tricuspid valve. Aortic and pulmonic valves are not well visualized. Consider TEE for further evaluation, if clinically indicated.  2. Left ventricular ejection fraction, by estimation, is 55 to 60%. The left ventricle has normal function. Left ventricular endocardial border not optimally defined to evaluate regional wall motion. Left ventricular diastolic function could not be evaluated.  3. Right ventricular systolic function is normal. The right ventricular size is normal.  4. Left atrial size was severely dilated.  5. The mitral valve is normal in structure. Trivial mitral valve regurgitation. No evidence of mitral stenosis.  6. The aortic valve was not well visualized. Aortic valve regurgitation is not visualized. Mild aortic valve stenosis. Aortic valve area, by VTI measures 1.63 cm. Aortic valve mean gradient measures 10.0 mmHg. Aortic valve Vmax measures 2.18 m/s.  7. The inferior vena cava is dilated in size but collapsibility could not be evaluated. Comparison(s): No significant change from prior study. FINDINGS  Left Ventricle: Left ventricular ejection fraction, by estimation, is 55 to 60%. The left ventricle has normal function. Left ventricular endocardial border not optimally defined to evaluate regional wall motion. The left ventricular internal cavity size was normal in size. There is no left ventricular hypertrophy. Left ventricular diastolic function could not be evaluated due to atrial fibrillation.  Left ventricular diastolic function could not be evaluated. Right Ventricle: The right ventricular size is normal. No increase in right ventricular wall thickness. Right ventricular systolic function is normal. Left Atrium: Left atrial size was severely dilated. Right Atrium: Right atrial size was normal in size. Pericardium: There is no evidence of pericardial effusion. Mitral Valve: The mitral valve is normal in structure. Trivial mitral valve regurgitation. No  evidence of mitral valve stenosis. Tricuspid Valve: The tricuspid valve is normal in structure. Tricuspid valve regurgitation is not demonstrated. No evidence of tricuspid stenosis. Aortic Valve: The aortic valve was not well visualized. Aortic valve regurgitation is not visualized. Mild aortic stenosis is present. Aortic valve mean gradient measures 10.0 mmHg. Aortic valve peak gradient measures 19.0 mmHg. Aortic valve area, by VTI  measures 1.63 cm. Pulmonic Valve: The pulmonic valve was not well visualized. Pulmonic valve regurgitation is not visualized. No evidence of pulmonic stenosis. Aorta: The aortic root is normal in size and structure. Venous: The inferior vena cava is dilated in size but collapsibility could not be evaluated. IAS/Shunts: No atrial level shunt detected by color flow Doppler.  LEFT VENTRICLE PLAX 2D LVIDd:         4.30 cm LVIDs:         2.90 cm LV PW:         1.00 cm LV IVS:        1.10 cm LVOT diam:     2.00 cm LV SV:         62 LV SV Index:   29 LVOT Area:     3.14 cm  RIGHT VENTRICLE TAPSE (M-mode): 1.7 cm LEFT ATRIUM              Index        RIGHT ATRIUM           Index LA diam:        3.40 cm  1.60 cm/m   RA Area:     21.20 cm LA Vol (A2C):   87.4 ml  41.20 ml/m  RA Volume:   61.40 ml  28.94 ml/m LA Vol (A4C):   109.0 ml 51.38 ml/m LA Biplane Vol: 98.5 ml  46.43 ml/m  AORTIC VALVE AV Area (Vmax):    1.50 cm AV Area (Vmean):   1.71 cm AV Area (VTI):     1.63 cm AV Vmax:           218.00 cm/s AV Vmean:           146.500 cm/s AV VTI:            0.382 m AV Peak Grad:      19.0 mmHg AV Mean Grad:      10.0 mmHg LVOT Vmax:         104.00 cm/s LVOT Vmean:        79.700 cm/s LVOT VTI:          0.198 m LVOT/AV VTI ratio: 0.52  AORTA Ao Root diam: 3.20 cm MITRAL VALVE MV Area (PHT): 3.37 cm     SHUNTS MV Decel Time: 225 msec     Systemic VTI:  0.20 m MV E velocity: 122.00 cm/s  Systemic Diam: 2.00 cm Vishnu Priya Mallipeddi Electronically signed by Winfield Rast Mallipeddi Signature Date/Time: 12/22/2023/9:10:41 AM    Final    MR FOOT RIGHT W WO CONTRAST Result Date: 12/21/2023 CLINICAL DATA:  2nd toe pain. 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 EXAM: MRI OF THE RIGHT FOREFOOT WITHOUT AND WITH CONTRAST TECHNIQUE: Multiplanar, multisequence MR imaging of the right forefoot was performed before and after the administration of intravenous contrast. CONTRAST:  9mL GADAVIST GADOBUTROL 1 MMOL/ML IV SOLN COMPARISON:  Right foot radiographs 12/20/2023 FINDINGS: Soft tissues A there is mild-to-moderate soft tissue swelling of the great toe, greatest at the distal lateral aspect. There is multilobular decreased T1 increased T2 signal within the region  of the distal dorsal lateral aspect of the great toe, both under the distal 11 mm of the toenail (coronal series 9, image 11 and axial series 5, image 46) and bordering the dorsolateral aspect of the distal phalanx of the great toe (coronal series 9 images 11 through 13 and axial series 5 images 41 through 45). There is mild subcutaneous fat edema and swelling of the dorsal forefoot subcutaneous fat. Bones/Joint/Cartilage Adjacent to the likely abscesses in the distal dorsal and distal dorsolateral aspect of the great toe, there is mild marrow edema within the distal greater than proximal aspect of the great toe distal phalanx concerning for acute osteomyelitis (sagittal series 8 images 20 through 22). Moderate great toe metatarsophalangeal joint space narrowing,  cartilage thinning, and peripheral osteophytosis. There is a well corticated chronic ossicle measuring up to 10 mm dorsal to the distal first metatarsal head and neck. Mild likely degenerative marrow edema within the deep aspect of the lateral great toe metatarsophalangeal joint sesamoid (series 5, image 26). Moderate to high-grade cartilage thinning with mild-to-moderate peripheral osteophytes of the navicular-cuneiform articulations. Within the limitations of inhomogeneous fat saturation at the distal aspect of the second through fifth toes, no definitive additional phalangeal marrow edema is seen to indicate evidence of acute osteomyelitis. Ligaments The Lisfranc ligament complex is intact. The metatarsophalangeal and interphalangeal collateral ligaments are intact. The sesamoid-phalangeal ligaments of the great toe plantar plate are intact. Muscles and Tendons There is moderate edema within the plantar forefoot musculature, nonspecific. IMPRESSION: 1. Mild-to-moderate soft tissue swelling of the great toe, greatest at the distal lateral aspect. There are multilobular fluid collections suspicious for abscesses within the distal dorsal and distal dorsolateral aspect of the great toe, both under the distal 11 mm of the toenail and bordering the dorsolateral aspect of the distal phalanx of the great toe. 2. There is mild marrow edema within the distal greater than proximal aspect of the great toe distal phalanx concerning for acute osteomyelitis. 3. Moderate great toe metatarsophalangeal joint osteoarthritis. Electronically Signed   By: Neita Garnet M.D.   On: 12/21/2023 17:44    Microbiology: No results found for this or any previous visit (from the past 240 hours).   Labs: Basic Metabolic Panel: No results for input(s): "NA", "K", "CL", "CO2", "GLUCOSE", "BUN", "CREATININE", "CALCIUM", "MG", "PHOS" in the last 168 hours. Liver Function Tests: No results for input(s): "AST", "ALT", "ALKPHOS", "BILITOT",  "PROT", "ALBUMIN" in the last 168 hours. No results for input(s): "LIPASE", "AMYLASE" in the last 168 hours. No results for input(s): "AMMONIA" in the last 168 hours. CBC: No results for input(s): "WBC", "NEUTROABS", "HGB", "HCT", "MCV", "PLT" in the last 168 hours. Cardiac Enzymes: No results for input(s): "CKTOTAL", "CKMB", "CKMBINDEX", "TROPONINI" in the last 168 hours. D-Dimer No results for input(s): "DDIMER" in the last 72 hours. BNP: Invalid input(s): "POCBNP" CBG: No results for input(s): "GLUCAP" in the last 168 hours. Anemia work up No results for input(s): "VITAMINB12", "FOLATE", "FERRITIN", "TIBC", "IRON", "RETICCTPCT" in the last 72 hours. Urinalysis    Component Value Date/Time   COLORURINE AMBER (A) 12/20/2023 1600   APPEARANCEUR HAZY (A) 12/20/2023 1600   LABSPEC 1.024 12/20/2023 1600   PHURINE 5.0 12/20/2023 1600   GLUCOSEU NEGATIVE 12/20/2023 1600   HGBUR LARGE (A) 12/20/2023 1600   BILIRUBINUR NEGATIVE 12/20/2023 1600   KETONESUR NEGATIVE 12/20/2023 1600   PROTEINUR 100 (A) 12/20/2023 1600   NITRITE NEGATIVE 12/20/2023 1600   LEUKOCYTESUR NEGATIVE 12/20/2023 1600   Sepsis Labs No results  for input(s): "WBC" in the last 168 hours.  Invalid input(s): "PROCALCITONIN", "LACTICIDVEN"     SIGNED:  Rhetta Mura, MD  [facility] 01/20/2024, 2:08 PM Pager   If 7PM-7AM, please contact night-coverage [email or web address] Password [email or web address]

## 2024-02-09 NOTE — Progress Notes (Signed)
 Death Summary  David Duarte:[id number] DOB: [birth year] DOA: 01/03/24  PCP: Sonny Masters, FNP  Admit date: 01-03-2024 Date of Death: 25-Jan-2024 Time of Death: unknown Notification: Sonny Masters, FNP notified of death of 01/25/24   History of present illness:  David Duarte is a 83 y.o. male with a history of  David Duarte presented with complaint of sepsis on admission secondary to MRSA with great toe amputation and was on vancomycin--Dr. Lovell Sheehan saw him and performed the It appears that patient had progression of disease process during hospital stay mental status fluctuated to some degree during hospital stay and despite placing PICC line daptomycin and trying to manage his symptoms he became more and more delirious He was resumed on his home meds and we tried to manage some of his delirium with Zydus but unfortunately his mentation did not clear-he was combative at times and then became progressively more lethargic Ultimately because of his worsening lethargy and climbing BUN/creatinine as well as climbing sodium and poor p.o. intake he was lethargic and was unable to orient on several days with his worst symptoms being on the 15th and 16 February I had a detailed discussion with the patient's wife and family and she elected to pursue comfort measures Patient expired sometime on 2024-01-04   Final Diagnoses:  Bacteremia secondary to MRSA Hyponatremia secondary to poor p.o. intake AKI secondary to poor p.o. intake   The results of significant diagnostics from this hospitalization (including imaging, microbiology, ancillary and laboratory) are listed below for reference.    Significant Diagnostic Studies: ECHO TEE Result Date: 12/28/2023    TRANSESOPHOGEAL ECHO REPORT   Patient Name:   David Duarte Date of Exam: 12/24/2023 Medical Rec #:  [id number]          Height:       72.0 in Accession #:    [id number]         Weight:       198.0 lb Date of Birth:  Jun 16, 1941           BSA:          2.121 m Patient Age:    82 years           BP:           142/87 mmHg Patient Gender: M                  HR:           93 bpm. [facility] Procedure: Transesophageal Echo, Cardiac Doppler and Color Doppler (Both            Spectral and Color Flow Doppler were utilized during procedure). Indications:     Endocarditis  History:         Patient has prior history of Echocardiogram examinations, most                  recent 12/21/2023. Risk Factors:Diabetes.  Sonographer:     Darlys Gales Referring Phys:  [id number] [facility] [address] Diagnosing Phys: Winfield Rast Mallipeddi PROCEDURE: After discussion of the risks and benefits of a TEE, an informed consent was obtained from the patient. The transesophogeal probe was passed without difficulty through the esophogus of the patient. Imaged were obtained with the patient in a left lateral decubitus position. Sedation performed by different physician. The patient was monitored while under deep sedation. Anesthestetic sedation was provided intravenously by Anesthesiology: 181mg  of Propofol. Image quality was good.  The patient's  vital signs; including heart rate, blood pressure, and oxygen saturation; remained stable throughout the procedure. The patient developed no complications during the procedure.  IMPRESSIONS  1. Left ventricular ejection fraction, by estimation, is 55 to 60%. The left ventricle has normal function. Left ventricular endocardial border not optimally defined to evaluate regional wall motion. There is mild left ventricular hypertrophy. Left ventricular diastolic function could not be evaluated.  2. Right ventricular systolic function is normal. The right ventricular size is normal.  3. The mitral valve is normal in structure. Trivial mitral valve regurgitation. No evidence of mitral stenosis.  4. 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 There is mild calcification of the  aortic valve. Aortic valve regurgitation is not visualized. No aortic stenosis is present.  5. No left atrial/left atrial appendage thrombus was detected. The LAA emptying velocity was 34 cm/s. Conclusion(s)/Recommendation(s): Normal biventricular function without evidence of hemodynamically significant valvular heart disease. No evidence of vegetation/infective endocarditis on this transesophageael echocardiogram. FINDINGS  Left Ventricle: Left ventricular ejection fraction, by estimation, is 55 to 60%. The left ventricle has normal function. Left ventricular endocardial border not optimally defined to evaluate regional wall motion. Strain imaging was not performed. The left ventricular internal cavity size was normal in size. There is mild left ventricular hypertrophy. Left ventricular diastolic function could not be evaluated. Right Ventricle: The right ventricular size is normal. No increase in right ventricular wall thickness. Right ventricular systolic function is normal. Left Atrium: Left atrial size was not assessed. No left atrial/left atrial appendage thrombus was detected. The LAA emptying velocity was 34 cm/s. Right Atrium: Right atrial size was not assessed. Pericardium: There is no evidence of pericardial effusion. Mitral Valve: The mitral valve is normal in structure. Trivial mitral valve regurgitation. No evidence of mitral valve stenosis. Tricuspid Valve: The tricuspid valve is normal in structure. Tricuspid valve regurgitation is mild . No evidence of tricuspid stenosis. Aortic Valve: The aortic valve is normal in structure. There is mild calcification of the aortic valve. Aortic valve regurgitation is not visualized. Mild aortic stenosis is present. Pulmonic Valve: The pulmonic valve was normal in structure. Pulmonic valve regurgitation is not visualized. No evidence of pulmonic stenosis. Aorta: Aortic root could not be assessed. There is minimal (Grade I) layered plaque involving the ascending  aorta and aortic arch. Venous: The inferior vena cava was not well visualized. IAS/Shunts: No atrial level shunt detected by color flow Doppler. Additional Comments: 3D imaging was not performed. Spectral Doppler performed. Vishnu Priya Mallipeddi Electronically signed by Winfield Rast Mallipeddi Signature Date/Time: 12/28/2023/12:36:06 PM    Final (Updated)    DG CHEST PORT 1 VIEW Result Date: 12/26/2023 CLINICAL DATA:  Pneumonia. EXAM: PORTABLE CHEST 1 VIEW COMPARISON:  12/25/2023 FINDINGS: There is a right arm PICC line with tip at the superior cavoatrial junction. The heart size appears within normal limits. There is no pleural fluid, interstitial edema, or consolidative change. Lung volumes are low. Visualized osseous structures are unremarkable. IMPRESSION: Low lung volumes. No acute findings. Electronically Signed   By: Signa Kell M.D.   On: 12/26/2023 11:59   DG Chest Portable 1 View Result Date: 12/25/2023 CLINICAL DATA:  PICC line placement EXAM: PORTABLE CHEST 1 VIEW COMPARISON:  12/20/2023 FINDINGS: Right PICC line tip: Lower SVC. No pneumothorax or complicating feature. Upper normal heart size. Low lung volumes. No blunting of the costophrenic angles. No significant bony findings. IMPRESSION: 1. Right PICC line tip:  lower SVC. No pneumothorax or complicating feature. 2. Low lung volumes. Electronically Signed   By: Gaylyn Rong M.D.   On: 12/25/2023 17:06   [place] EKG SITE RITE Result Date: 12/24/2023 If Site Rite image not attached, placement could not be confirmed due to current cardiac rhythm.  ECHOCARDIOGRAM COMPLETE Result Date: 12/22/2023    ECHOCARDIOGRAM REPORT   Patient Name:   David Duarte Date of Exam: 12/21/2023 Medical Rec #:  [id number]          Height:       72.0 in Accession #:    [id number]         Weight:       198.0 lb Date of Birth:  Aug 25, 1941          BSA:          2.121 m Patient Age:    82 years           BP:           101/48 mmHg Patient Gender: M                   HR:           91 bpm. [facility] Procedure: 2D Echo, Cardiac Doppler and Color Doppler Indications:    Endocarditis l38  History:        Patient has prior history of Echocardiogram examinations, most                 recent 07/03/2022. Arrythmias:Atrial Fibrillation; Risk                 Factors:Hypertension, Diabetes, Dyslipidemia and Former Smoker.  Sonographer:    Celesta Gentile RCS Referring Phys: [id number] [facility] IMPRESSIONS  1. No obvious vegetations on mitral valve or tricuspid valve. Aortic and pulmonic valves are not well visualized. Consider TEE for further evaluation, if clinically indicated.  2. Left ventricular ejection fraction, by estimation, is 55 to 60%. The left ventricle has normal function. Left ventricular endocardial border not optimally defined to evaluate regional wall motion. Left ventricular diastolic function could not be evaluated.  3. Right ventricular systolic function is normal. The right ventricular size is normal.  4. Left atrial size was severely dilated.  5. The mitral valve is normal in structure. Trivial mitral valve regurgitation. No evidence of mitral stenosis.  6. The aortic valve was not well visualized. Aortic valve regurgitation is not visualized. Mild aortic valve stenosis. Aortic valve area, by VTI measures 1.63 cm. Aortic valve mean gradient measures 10.0 mmHg. Aortic valve Vmax measures 2.18 m/s.  7. The inferior vena cava is dilated in size but collapsibility could not be evaluated. Comparison(s): No significant change from prior study. FINDINGS  Left Ventricle: Left ventricular ejection fraction, by estimation, is 55 to 60%. The left ventricle has normal function. Left ventricular endocardial border not optimally defined to evaluate regional wall motion. The left ventricular internal cavity size was normal in size. There is no left ventricular hypertrophy. Left ventricular diastolic function could not be evaluated due to atrial fibrillation.  Left ventricular diastolic function could not be evaluated. Right Ventricle: The right ventricular size is normal. No increase in right ventricular wall thickness. Right ventricular systolic function is normal. Left Atrium: Left atrial size was severely dilated. Right Atrium: Right atrial size was normal in size. Pericardium: There is no evidence of pericardial effusion. Mitral Valve: The mitral valve is normal in structure. Trivial mitral valve regurgitation. No  evidence of mitral valve stenosis. Tricuspid Valve: The tricuspid valve is normal in structure. Tricuspid valve regurgitation is not demonstrated. No evidence of tricuspid stenosis. Aortic Valve: The aortic valve was not well visualized. Aortic valve regurgitation is not visualized. Mild aortic stenosis is present. Aortic valve mean gradient measures 10.0 mmHg. Aortic valve peak gradient measures 19.0 mmHg. Aortic valve area, by VTI  measures 1.63 cm. Pulmonic Valve: The pulmonic valve was not well visualized. Pulmonic valve regurgitation is not visualized. No evidence of pulmonic stenosis. Aorta: The aortic root is normal in size and structure. Venous: The inferior vena cava is dilated in size but collapsibility could not be evaluated. IAS/Shunts: No atrial level shunt detected by color flow Doppler.  LEFT VENTRICLE PLAX 2D LVIDd:         4.30 cm LVIDs:         2.90 cm LV PW:         1.00 cm LV IVS:        1.10 cm LVOT diam:     2.00 cm LV SV:         62 LV SV Index:   29 LVOT Area:     3.14 cm  RIGHT VENTRICLE TAPSE (M-mode): 1.7 cm LEFT ATRIUM              Index        RIGHT ATRIUM           Index LA diam:        3.40 cm  1.60 cm/m   RA Area:     21.20 cm LA Vol (A2C):   87.4 ml  41.20 ml/m  RA Volume:   61.40 ml  28.94 ml/m LA Vol (A4C):   109.0 ml 51.38 ml/m LA Biplane Vol: 98.5 ml  46.43 ml/m  AORTIC VALVE AV Area (Vmax):    1.50 cm AV Area (Vmean):   1.71 cm AV Area (VTI):     1.63 cm AV Vmax:           218.00 cm/s AV Vmean:           146.500 cm/s AV VTI:            0.382 m AV Peak Grad:      19.0 mmHg AV Mean Grad:      10.0 mmHg LVOT Vmax:         104.00 cm/s LVOT Vmean:        79.700 cm/s LVOT VTI:          0.198 m LVOT/AV VTI ratio: 0.52  AORTA Ao Root diam: 3.20 cm MITRAL VALVE MV Area (PHT): 3.37 cm     SHUNTS MV Decel Time: 225 msec     Systemic VTI:  0.20 m MV E velocity: 122.00 cm/s  Systemic Diam: 2.00 cm Vishnu Priya Mallipeddi Electronically signed by Winfield Rast Mallipeddi Signature Date/Time: 12/22/2023/9:10:41 AM    Final    MR FOOT RIGHT W WO CONTRAST Result Date: 12/21/2023 CLINICAL DATA:  2nd toe pain. 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 EXAM: MRI OF THE RIGHT FOREFOOT WITHOUT AND WITH CONTRAST TECHNIQUE: Multiplanar, multisequence MR imaging of the right forefoot was performed before and after the administration of intravenous contrast. CONTRAST:  9mL GADAVIST GADOBUTROL 1 MMOL/ML IV SOLN COMPARISON:  Right foot radiographs 12/20/2023 FINDINGS: Soft tissues A there is mild-to-moderate soft tissue swelling of the great toe, greatest at the distal lateral aspect. There is multilobular decreased T1 increased T2 signal within the region  of the distal dorsal lateral aspect of the great toe, both under the distal 11 mm of the toenail (coronal series 9, image 11 and axial series 5, image 46) and bordering the dorsolateral aspect of the distal phalanx of the great toe (coronal series 9 images 11 through 13 and axial series 5 images 41 through 45). There is mild subcutaneous fat edema and swelling of the dorsal forefoot subcutaneous fat. Bones/Joint/Cartilage Adjacent to the likely abscesses in the distal dorsal and distal dorsolateral aspect of the great toe, there is mild marrow edema within the distal greater than proximal aspect of the great toe distal phalanx concerning for acute osteomyelitis (sagittal series 8 images 20 through 22). Moderate great toe metatarsophalangeal joint space narrowing,  cartilage thinning, and peripheral osteophytosis. There is a well corticated chronic ossicle measuring up to 10 mm dorsal to the distal first metatarsal head and neck. Mild likely degenerative marrow edema within the deep aspect of the lateral great toe metatarsophalangeal joint sesamoid (series 5, image 26). Moderate to high-grade cartilage thinning with mild-to-moderate peripheral osteophytes of the navicular-cuneiform articulations. Within the limitations of inhomogeneous fat saturation at the distal aspect of the second through fifth toes, no definitive additional phalangeal marrow edema is seen to indicate evidence of acute osteomyelitis. Ligaments The Lisfranc ligament complex is intact. The metatarsophalangeal and interphalangeal collateral ligaments are intact. The sesamoid-phalangeal ligaments of the great toe plantar plate are intact. Muscles and Tendons There is moderate edema within the plantar forefoot musculature, nonspecific. IMPRESSION: 1. Mild-to-moderate soft tissue swelling of the great toe, greatest at the distal lateral aspect. There are multilobular fluid collections suspicious for abscesses within the distal dorsal and distal dorsolateral aspect of the great toe, both under the distal 11 mm of the toenail and bordering the dorsolateral aspect of the distal phalanx of the great toe. 2. There is mild marrow edema within the distal greater than proximal aspect of the great toe distal phalanx concerning for acute osteomyelitis. 3. Moderate great toe metatarsophalangeal joint osteoarthritis. Electronically Signed   By: Neita Garnet M.D.   On: 12/21/2023 17:44    Microbiology: No results found for this or any previous visit (from the past 240 hours).   Labs: Basic Metabolic Panel: No results for input(s): "NA", "K", "CL", "CO2", "GLUCOSE", "BUN", "CREATININE", "CALCIUM", "MG", "PHOS" in the last 168 hours. Liver Function Tests: No results for input(s): "AST", "ALT", "ALKPHOS", "BILITOT",  "PROT", "ALBUMIN" in the last 168 hours. No results for input(s): "LIPASE", "AMYLASE" in the last 168 hours. No results for input(s): "AMMONIA" in the last 168 hours. CBC: No results for input(s): "WBC", "NEUTROABS", "HGB", "HCT", "MCV", "PLT" in the last 168 hours. Cardiac Enzymes: No results for input(s): "CKTOTAL", "CKMB", "CKMBINDEX", "TROPONINI" in the last 168 hours. D-Dimer No results for input(s): "DDIMER" in the last 72 hours. BNP: Invalid input(s): "POCBNP" CBG: No results for input(s): "GLUCAP" in the last 168 hours. Anemia work up No results for input(s): "VITAMINB12", "FOLATE", "FERRITIN", "TIBC", "IRON", "RETICCTPCT" in the last 72 hours. Urinalysis    Component Value Date/Time   COLORURINE AMBER (A) 12/20/2023 1600   APPEARANCEUR HAZY (A) 12/20/2023 1600   LABSPEC 1.024 12/20/2023 1600   PHURINE 5.0 12/20/2023 1600   GLUCOSEU NEGATIVE 12/20/2023 1600   HGBUR LARGE (A) 12/20/2023 1600   BILIRUBINUR NEGATIVE 12/20/2023 1600   KETONESUR NEGATIVE 12/20/2023 1600   PROTEINUR 100 (A) 12/20/2023 1600   NITRITE NEGATIVE 12/20/2023 1600   LEUKOCYTESUR NEGATIVE 12/20/2023 1600   Sepsis Labs No results  for input(s): "WBC" in the last 168 hours.  Invalid input(s): "PROCALCITONIN", "LACTICIDVEN"     SIGNED:  Rhetta Mura, MD  [facility] 01/20/2024, 1:56 PM Pager   If 7PM-7AM, please contact night-coverage [email or web address] Password [email or web address]
